# Patient Record
Sex: Female | Born: 1975 | Race: Black or African American | Hispanic: No | Marital: Married | Smoking: Never smoker
Health system: Southern US, Community
[De-identification: ages and names within clinical notes are randomized; demographics above are authoritative.]

## PROBLEM LIST (undated history)

## (undated) DIAGNOSIS — E282 Polycystic ovarian syndrome: Secondary | ICD-10-CM

## (undated) DIAGNOSIS — R112 Nausea with vomiting, unspecified: Secondary | ICD-10-CM

## (undated) DIAGNOSIS — Z9889 Other specified postprocedural states: Secondary | ICD-10-CM

## (undated) DIAGNOSIS — R011 Cardiac murmur, unspecified: Secondary | ICD-10-CM

## (undated) DIAGNOSIS — R030 Elevated blood-pressure reading, without diagnosis of hypertension: Secondary | ICD-10-CM

## (undated) DIAGNOSIS — R51 Headache: Secondary | ICD-10-CM

## (undated) DIAGNOSIS — D649 Anemia, unspecified: Secondary | ICD-10-CM

## (undated) HISTORY — PX: LYMPH GLAND EXCISION: SHX13

## (undated) HISTORY — PX: LAPAROSCOPIC OVARIAN CYSTECTOMY: SUR786

## (undated) HISTORY — PX: BUNIONECTOMY: SHX129

---

## 1998-12-24 ENCOUNTER — Other Ambulatory Visit: Admission: RE | Admit: 1998-12-24 | Discharge: 1998-12-24 | Payer: Self-pay | Admitting: Internal Medicine

## 1998-12-29 ENCOUNTER — Encounter: Admission: RE | Admit: 1998-12-29 | Discharge: 1998-12-29 | Payer: Self-pay | Admitting: Internal Medicine

## 1998-12-29 ENCOUNTER — Encounter: Payer: Self-pay | Admitting: Internal Medicine

## 1999-01-29 ENCOUNTER — Encounter: Payer: Self-pay | Admitting: Gastroenterology

## 1999-01-29 ENCOUNTER — Encounter: Admission: RE | Admit: 1999-01-29 | Discharge: 1999-01-29 | Payer: Self-pay | Admitting: Gastroenterology

## 1999-07-22 ENCOUNTER — Other Ambulatory Visit: Admission: RE | Admit: 1999-07-22 | Discharge: 1999-07-22 | Payer: Self-pay | Admitting: Surgery

## 2000-08-08 ENCOUNTER — Other Ambulatory Visit: Admission: RE | Admit: 2000-08-08 | Discharge: 2000-08-08 | Payer: Self-pay | Admitting: Internal Medicine

## 2001-12-07 ENCOUNTER — Other Ambulatory Visit: Admission: RE | Admit: 2001-12-07 | Discharge: 2001-12-07 | Payer: Self-pay | Admitting: Internal Medicine

## 2002-06-16 ENCOUNTER — Encounter: Payer: Self-pay | Admitting: Internal Medicine

## 2002-06-16 ENCOUNTER — Ambulatory Visit (HOSPITAL_COMMUNITY): Admission: RE | Admit: 2002-06-16 | Discharge: 2002-06-16 | Payer: Self-pay | Admitting: Internal Medicine

## 2003-02-28 ENCOUNTER — Other Ambulatory Visit: Admission: RE | Admit: 2003-02-28 | Discharge: 2003-02-28 | Payer: Self-pay | Admitting: Internal Medicine

## 2003-03-04 ENCOUNTER — Encounter: Admission: RE | Admit: 2003-03-04 | Discharge: 2003-03-04 | Payer: Self-pay | Admitting: Internal Medicine

## 2003-11-28 ENCOUNTER — Encounter: Admission: RE | Admit: 2003-11-28 | Discharge: 2003-11-28 | Payer: Self-pay | Admitting: Internal Medicine

## 2004-01-10 ENCOUNTER — Ambulatory Visit (HOSPITAL_COMMUNITY): Admission: RE | Admit: 2004-01-10 | Discharge: 2004-01-10 | Payer: Self-pay | Admitting: *Deleted

## 2004-01-10 ENCOUNTER — Encounter (INDEPENDENT_AMBULATORY_CARE_PROVIDER_SITE_OTHER): Payer: Self-pay | Admitting: *Deleted

## 2004-01-10 ENCOUNTER — Ambulatory Visit (HOSPITAL_BASED_OUTPATIENT_CLINIC_OR_DEPARTMENT_OTHER): Admission: RE | Admit: 2004-01-10 | Discharge: 2004-01-10 | Payer: Self-pay | Admitting: *Deleted

## 2004-03-03 ENCOUNTER — Other Ambulatory Visit: Admission: RE | Admit: 2004-03-03 | Discharge: 2004-03-03 | Payer: Self-pay | Admitting: Internal Medicine

## 2004-04-21 ENCOUNTER — Encounter: Admission: RE | Admit: 2004-04-21 | Discharge: 2004-04-21 | Payer: Self-pay | Admitting: Internal Medicine

## 2005-03-18 ENCOUNTER — Other Ambulatory Visit: Admission: RE | Admit: 2005-03-18 | Discharge: 2005-03-18 | Payer: Self-pay | Admitting: Internal Medicine

## 2006-03-28 ENCOUNTER — Other Ambulatory Visit: Admission: RE | Admit: 2006-03-28 | Discharge: 2006-03-28 | Payer: Self-pay | Admitting: Obstetrics and Gynecology

## 2006-09-23 ENCOUNTER — Inpatient Hospital Stay (HOSPITAL_COMMUNITY): Admission: AD | Admit: 2006-09-23 | Discharge: 2006-09-26 | Payer: Self-pay | Admitting: Obstetrics and Gynecology

## 2006-09-27 ENCOUNTER — Encounter: Admission: RE | Admit: 2006-09-27 | Discharge: 2006-10-27 | Payer: Self-pay | Admitting: Obstetrics and Gynecology

## 2006-10-28 ENCOUNTER — Encounter: Admission: RE | Admit: 2006-10-28 | Discharge: 2006-11-26 | Payer: Self-pay | Admitting: Obstetrics and Gynecology

## 2006-11-27 ENCOUNTER — Encounter: Admission: RE | Admit: 2006-11-27 | Discharge: 2006-12-27 | Payer: Self-pay | Admitting: Obstetrics and Gynecology

## 2006-12-28 ENCOUNTER — Encounter: Admission: RE | Admit: 2006-12-28 | Discharge: 2007-01-26 | Payer: Self-pay | Admitting: Obstetrics and Gynecology

## 2007-01-27 ENCOUNTER — Encounter: Admission: RE | Admit: 2007-01-27 | Discharge: 2007-02-26 | Payer: Self-pay | Admitting: Obstetrics and Gynecology

## 2007-02-26 ENCOUNTER — Emergency Department (HOSPITAL_COMMUNITY): Admission: EM | Admit: 2007-02-26 | Discharge: 2007-02-26 | Payer: Self-pay | Admitting: Emergency Medicine

## 2007-02-27 ENCOUNTER — Encounter: Admission: RE | Admit: 2007-02-27 | Discharge: 2007-03-29 | Payer: Self-pay | Admitting: Obstetrics and Gynecology

## 2007-03-30 ENCOUNTER — Encounter: Admission: RE | Admit: 2007-03-30 | Discharge: 2007-04-26 | Payer: Self-pay | Admitting: Obstetrics and Gynecology

## 2007-04-27 ENCOUNTER — Encounter: Admission: RE | Admit: 2007-04-27 | Discharge: 2007-05-27 | Payer: Self-pay | Admitting: Obstetrics and Gynecology

## 2007-05-28 ENCOUNTER — Encounter: Admission: RE | Admit: 2007-05-28 | Discharge: 2007-06-21 | Payer: Self-pay | Admitting: Obstetrics and Gynecology

## 2007-10-29 ENCOUNTER — Emergency Department (HOSPITAL_COMMUNITY): Admission: EM | Admit: 2007-10-29 | Discharge: 2007-10-29 | Payer: Self-pay | Admitting: Emergency Medicine

## 2008-06-28 ENCOUNTER — Encounter: Admission: RE | Admit: 2008-06-28 | Discharge: 2008-06-28 | Payer: Self-pay | Admitting: Internal Medicine

## 2009-06-05 ENCOUNTER — Other Ambulatory Visit: Admission: RE | Admit: 2009-06-05 | Discharge: 2009-06-05 | Payer: Self-pay | Admitting: Obstetrics and Gynecology

## 2010-02-08 NOTE — L&D Delivery Note (Signed)
Delivery Note At 5:53 PM a viable female was delivered via Vaginal at home.  Weight 6 lb 15.8 oz (3170 g).   Placenta status: , Pathology Spontaneous.  Cord: 3 vessels.  Anesthesia: Local. Episiotomy: None Lacerations: 2nd degree Suture Repair: 2.0 3.0 vicryl Est. Blood Loss (mL): 300 ml  Mom to postpartum.  Baby to NICU.  JACKSON-MOORE,Amariz Flamenco A 11/09/2010, 8:07 PM

## 2010-06-26 NOTE — Op Note (Signed)
NAME:  [name] [name], [name] [name] NO.:  [id number]   MEDICAL RECORD NO.:  [id number]          PATIENT TYPE:  AMB   LOCATION:  DSC                          FACILITY:  [facility]   PHYSICIAN:  Kathy Breach, M.D.      DATE OF BIRTH:  01/07/1976   DATE OF PROCEDURE:  01/10/2004  DATE OF DISCHARGE:                                 OPERATIVE REPORT   PREOPERATIVE DIAGNOSIS:  Persistent suprahyoid midline cervical node or  cyst.   OPERATIVE PROCEDURE:  Excision, suprahyoid midline node.   POSTOPERATIVE DIAGNOSIS:  Pending histologic evaluation.   DESCRIPTION OF PROCEDURE:  With the patient under general orotracheal  anesthesia, the anterior neck was prepped and draped in sterile fashion.  The patient had a palpable 1-1.5 cm cyst or node just to the right of the  middle immediately in the suprahyoid area of the neck.  Skin incision about  4 cm long was made in the depth of the skin crease of the neck and elevated.  The free margins of the mylohyoid muscle were identified.  Deep to the  mylohyoid muscles in the midline presented the mass and sharply dissecting,  there appeared to be a kidney-bean flattened lymph node immediately in the  suprahyoid region.  Hemostasis was complete with touch electrocautery and  clamp and ties with 4-0 silk sutures as indicated.  Upon removal, the wound  was irrigated.  Wound closed then with interrupted 4-0 chromic catgut  subcutaneously and the skin was approximated with a running 4-0 nylon  subcuticular suture.  Skin margins painted with Benzoin and a Steri-Strip  dressing applied over the incision line.  Blood loss for the procedure was  less than 10 mL.  The patient tolerated the procedure well and was taken to  the recovery room in stable general condition.       JGL/MEDQ  D:  01/10/2004  T:  01/11/2004  Job:  [id number]

## 2010-09-17 LAB — ABO/RH: RH Type: POSITIVE

## 2010-09-17 LAB — RUBELLA ANTIBODY, IGM: Rubella: IMMUNE

## 2010-09-17 LAB — HEPATITIS B SURFACE ANTIGEN: Hepatitis B Surface Ag: NEGATIVE

## 2010-10-06 ENCOUNTER — Ambulatory Visit: Payer: Self-pay | Admitting: Obstetrics

## 2010-10-29 ENCOUNTER — Encounter: Payer: Self-pay | Admitting: Obstetrics and Gynecology

## 2010-11-06 LAB — STREP B DNA PROBE: GBS: NEGATIVE

## 2010-11-09 ENCOUNTER — Encounter (HOSPITAL_COMMUNITY): Payer: Self-pay

## 2010-11-09 ENCOUNTER — Encounter (HOSPITAL_COMMUNITY): Payer: Self-pay | Admitting: Obstetrics

## 2010-11-09 ENCOUNTER — Inpatient Hospital Stay (HOSPITAL_COMMUNITY)
Admission: AD | Admit: 2010-11-09 | Discharge: 2010-11-09 | Disposition: A | Discharge status: Home / Self Care | Source: Ambulatory Visit | Attending: Obstetrics & Gynecology | Admitting: Obstetrics & Gynecology

## 2010-11-09 ENCOUNTER — Inpatient Hospital Stay (HOSPITAL_COMMUNITY)
Admission: AD | Admit: 2010-11-09 | Discharge: 2010-11-11 | DRG: 775 | Disposition: A | Discharge status: Home / Self Care | Source: Ambulatory Visit | Attending: Obstetrics & Gynecology | Admitting: Obstetrics & Gynecology

## 2010-11-09 ENCOUNTER — Other Ambulatory Visit: Payer: Self-pay | Admitting: Obstetrics & Gynecology

## 2010-11-09 DIAGNOSIS — O479 False labor, unspecified: Secondary | ICD-10-CM | POA: Insufficient documentation

## 2010-11-09 DIAGNOSIS — O99019 Anemia complicating pregnancy, unspecified trimester: Secondary | ICD-10-CM | POA: Diagnosis present

## 2010-11-09 DIAGNOSIS — O09529 Supervision of elderly multigravida, unspecified trimester: Secondary | ICD-10-CM | POA: Diagnosis present

## 2010-11-09 HISTORY — DX: Anemia, unspecified: D64.9

## 2010-11-09 HISTORY — DX: Headache: R51

## 2010-11-09 HISTORY — DX: Polycystic ovarian syndrome: E28.2

## 2010-11-09 LAB — DIFFERENTIAL
Basophils Relative: 1
Eosinophils Relative: 2
Lymphs Abs: 2.7
Monocytes Absolute: 0.7
Neutro Abs: 5.1

## 2010-11-09 LAB — CBC
HCT: 30.9 — ABNORMAL LOW
MCV: 64.4 — ABNORMAL LOW
Platelets: 453 — ABNORMAL HIGH
RBC: 4.8
WBC: 8.8

## 2010-11-09 MED ORDER — BUTORPHANOL TARTRATE 2 MG/ML IJ SOLN
1.0000 mg | Freq: Once | INTRAMUSCULAR | Status: AC
  Administered 2010-11-09: 1 mg via INTRAVENOUS

## 2010-11-09 MED ORDER — MEASLES, MUMPS & RUBELLA VAC ~~LOC~~ INJ: 0.5000 mL | INJECTION | Freq: Once | SUBCUTANEOUS | Status: DC

## 2010-11-09 MED ORDER — MEDROXYPROGESTERONE ACETATE 150 MG/ML IM SUSP: 150.0000 mg | INTRAMUSCULAR | Status: DC | PRN

## 2010-11-09 MED ORDER — DIBUCAINE 1 % RE OINT
1.0000 "application " | TOPICAL_OINTMENT | RECTAL | Status: DC | PRN
  Administered 2010-11-11: 1 via RECTAL
  Filled 2010-11-09: qty 28

## 2010-11-09 MED ORDER — LIDOCAINE HCL (PF) 1 % IJ SOLN
INTRAMUSCULAR | Status: AC
  Filled 2010-11-09: qty 30

## 2010-11-09 MED ORDER — PRENATAL PLUS 27-1 MG PO TABS
1.0000 | ORAL_TABLET | Freq: Every day | ORAL | Status: DC
  Administered 2010-11-10 – 2010-11-11 (×2): 1 via ORAL
  Filled 2010-11-09 (×2): qty 1

## 2010-11-09 MED ORDER — LANOLIN HYDROUS EX OINT: TOPICAL_OINTMENT | CUTANEOUS | Status: DC | PRN

## 2010-11-09 MED ORDER — MAGNESIUM HYDROXIDE 400 MG/5ML PO SUSP
30.0000 mL | ORAL | Status: DC | PRN
  Administered 2010-11-11: 30 mL via ORAL
  Filled 2010-11-09: qty 30

## 2010-11-09 MED ORDER — IBUPROFEN 600 MG PO TABS
600.0000 mg | ORAL_TABLET | Freq: Four times a day (QID) | ORAL | Status: DC
  Administered 2010-11-10 – 2010-11-11 (×7): 600 mg via ORAL
  Filled 2010-11-09 (×7): qty 1

## 2010-11-09 MED ORDER — SENNOSIDES-DOCUSATE SODIUM 8.6-50 MG PO TABS
2.0000 | ORAL_TABLET | Freq: Every day | ORAL | Status: DC
  Administered 2010-11-09 – 2010-11-10 (×2): 2 via ORAL

## 2010-11-09 MED ORDER — BENZOCAINE-MENTHOL 20-0.5 % EX AERO
INHALATION_SPRAY | CUTANEOUS | Status: AC
  Administered 2010-11-09: 1 via TOPICAL
  Filled 2010-11-09: qty 56

## 2010-11-09 MED ORDER — OXYTOCIN 20 UNITS IN LACTATED RINGERS INFUSION - SIMPLE
INTRAVENOUS | Status: AC
  Administered 2010-11-09: 20 [IU]
  Filled 2010-11-09: qty 1000

## 2010-11-09 MED ORDER — FERROUS SULFATE 325 (65 FE) MG PO TABS
325.0000 mg | ORAL_TABLET | Freq: Two times a day (BID) | ORAL | Status: DC
  Administered 2010-11-11: 325 mg via ORAL
  Filled 2010-11-09 (×3): qty 1

## 2010-11-09 MED ORDER — OXYCODONE-ACETAMINOPHEN 5-325 MG PO TABS
1.0000 | ORAL_TABLET | ORAL | Status: DC | PRN
  Administered 2010-11-10 (×2): 1 via ORAL
  Filled 2010-11-09 (×2): qty 1

## 2010-11-09 MED ORDER — DIPHENHYDRAMINE HCL 25 MG PO CAPS: 25.0000 mg | ORAL_CAPSULE | Freq: Four times a day (QID) | ORAL | Status: DC | PRN

## 2010-11-09 MED ORDER — WITCH HAZEL-GLYCERIN EX PADS
1.0000 "application " | MEDICATED_PAD | CUTANEOUS | Status: DC | PRN
  Administered 2010-11-11: 1 via TOPICAL

## 2010-11-09 MED ORDER — ZOLPIDEM TARTRATE 5 MG PO TABS: 5.0000 mg | ORAL_TABLET | Freq: Every evening | ORAL | Status: DC | PRN

## 2010-11-09 MED ORDER — ONDANSETRON HCL 4 MG PO TABS: 4.0000 mg | ORAL_TABLET | ORAL | Status: DC | PRN

## 2010-11-09 MED ORDER — ONDANSETRON HCL 4 MG/2ML IJ SOLN: 4.0000 mg | INTRAMUSCULAR | Status: DC | PRN

## 2010-11-09 MED ORDER — TETANUS-DIPHTH-ACELL PERTUSSIS 5-2.5-18.5 LF-MCG/0.5 IM SUSP
0.5000 mL | Freq: Once | INTRAMUSCULAR | Status: DC
  Filled 2010-11-09: qty 0.5

## 2010-11-09 MED ORDER — BENZOCAINE-MENTHOL 20-0.5 % EX AERO
1.0000 "application " | INHALATION_SPRAY | CUTANEOUS | Status: DC | PRN
  Administered 2010-11-09: 1 via TOPICAL

## 2010-11-09 NOTE — H&P (Signed)
[name] [name] is a 35 y.o. female presenting for continued care after precipitous delivery at home. Maternal Medical History:  Reason for admission: Reason for Admission:   nauseaBIBEMS s/p precipitous delivery of a viable female at home.  The placenta remains insitu.  Seen earlier in the office and in MAU-- 2 cm dilated.  Contractions: Onset was 6-12 hours ago.   Frequency: regular.    Prenatal complications: no prenatal complications   OB History    Grav Para Term Preterm Abortions TAB SAB Ect Mult Living   3 2 1 1 1  1   2      Past Medical History  Diagnosis Date  . Headache   . Anemia   . PCOS (polycystic ovarian syndrome)    Past Surgical History  Procedure Date  . Bunionectomy   . Laparoscopic ovarian cystectomy   . Lymph gland excision    Family History: family history is not on file. Social History:  reports that she has never smoked. She has never used smokeless tobacco. She reports that she does not drink alcohol or use illicit drugs.  Review of Systems  Constitutional: Negative for fever.  Eyes: Negative for blurred vision.  Respiratory: Negative for shortness of breath.   Gastrointestinal: Negative for nausea and vomiting.  Skin: Negative for rash.  Neurological: Negative for headaches.      Blood pressure 135/71, pulse 83, temperature 98.7 F (37.1 C), temperature source Oral, resp. rate 18, unknown if currently breastfeeding. Maternal Exam:  Introitus: See delivery note     Physical Exam  Constitutional: She appears well-developed.  HENT:  Head: Normocephalic.  Neck: Neck supple. No thyromegaly present.  Cardiovascular: Normal rate and regular rhythm.   Respiratory: Breath sounds normal.  GI: Soft. Bowel sounds are normal.  Skin: No rash noted.    Prenatal labs: ABO, Rh: AB/Positive/-- (08/09 0000) Antibody: Negative (08/09 0000) Rubella: Immune (08/09 0000) RPR: Nonreactive (08/09 0000)  HBsAg: Negative (08/09 0000)  HIV:  Non-reactive (08/09 0000)  GBS: Negative (09/28 0000)   Assessment/Plan: 35 y.o. s/p an NSVD at home.  Placenta insitu.  Admit Management of third stage   JACKSON-MOORE,Trenise Turay A 11/09/2010, 8:01 PM

## 2010-11-09 NOTE — Progress Notes (Signed)
Pt states she was in the office this am and was 1-2/100. States contractions now every 3 minutes. Denies bleeding or leaking and reports good fetal movement.

## 2010-11-10 LAB — CBC
MCH: 21.6 pg — ABNORMAL LOW (ref 26.0–34.0)
MCHC: 31.7 g/dL (ref 30.0–36.0)
Platelets: 270 10*3/uL (ref 150–400)
RBC: 4.22 MIL/uL (ref 3.87–5.11)

## 2010-11-10 NOTE — Progress Notes (Signed)
UR Chart review completed.  

## 2010-11-10 NOTE — Progress Notes (Signed)
  Post Partum Day 1 S/P spontaneous vaginal RH status/Rubella reviewed.  Feeding: breast Subjective: No HA, SOB, CP, F/C, breast symptoms. Normal vaginal bleeding, no clots.     Objective: BP 122/67  Pulse 76  Temp(Src) 98.2 F (36.8 C) (Oral)  Resp 18  SpO2 100%  Breastfeeding? Unknown   Physical Exam:  General: alert Lochia: appropriate Uterine Fundus: firm DVT Evaluation: No evidence of DVT seen on physical exam. Ext: No c/c/e  Basename 11/10/10 0505  HGB 9.1*  HCT 28.7*      Assessment/Plan: 35 y.o.  PPD #1 .  normal postpartum exam Continue current postpartum care  Ambulate   LOS: 1 day   JACKSON-MOORE,Jerrie Gullo A 11/10/2010, 10:27 AM

## 2010-11-11 DIAGNOSIS — O99019 Anemia complicating pregnancy, unspecified trimester: Secondary | ICD-10-CM | POA: Diagnosis present

## 2010-11-11 MED ORDER — POLYETHYLENE GLYCOL 3350 17 G PO PACK: 17.0000 g | PACK | Freq: Every day | ORAL | Status: DC

## 2010-11-11 MED ORDER — FERROUS SULFATE 325 (65 FE) MG PO TABS: 325.0000 mg | ORAL_TABLET | Freq: Two times a day (BID) | ORAL | Status: DC

## 2010-11-11 MED ORDER — IBUPROFEN 600 MG PO TABS: 600.0000 mg | ORAL_TABLET | Freq: Four times a day (QID) | ORAL | Status: AC

## 2010-11-11 MED ORDER — OXYCODONE-ACETAMINOPHEN 5-325 MG PO TABS: 1.0000 | ORAL_TABLET | ORAL | Status: AC | PRN

## 2010-11-11 NOTE — Progress Notes (Signed)
PSYCHOSOCIAL ASSESSMENT ~ MATERNAL/CHILD Name: [name] [name]                                                                                    Age: 35 years   Referral Date: 11/11/10   Reason/Source: NICU Support/NICU  I. FAMILY/HOME ENVIRONMENT A. Child's Legal Guardian _x__Parent(s) ___Grandparent ___Foster parent ___DSS_________________ Name: [name] [name]                                                              DOB: //                     Age:   Address: [address] [place], [place] [address]  Name: [name] [name]                                                              DOB: //                     Age:   Address: [place]  B. Other Household Members/Support Persons Name:                                    Relationship: MGM               DOB ___/___/___                   Name: [name] [name]               Relationship: sister              DOB 09/24/06                   Name:                                         Relationship:                        DOB ___/___/___                   Name:                                         Relationship:                        DOB ___/___/___  C. Other Support: friends   II. PSYCHOSOCIAL DATA A. Information Source  _x_Patient Interview  __Family Interview           _x_Other: chart  B. Event organiser _x_Employment: FOB is in the [employer].  MOB is not currently working. __Medicaid    [place]:                 _x_Private Insurance: Tricare                  __Self Pay  _x_Food Stamps   _x_WIC __[employer] First     __Public Housing     __Section 8    __Maternity Care Coordination/Child Service Coordination/Early Intervention  __School:                                                                         Grade:  __Other:   [name] [name] Cultural and Environment Information Cultural Issues Impacting Care: none  known  III. STRENGTHS _x__Supportive family/friends ___Adequate Resources _x__Compliance with medical plan _x__Home prepared for Child (including basic supplies) _x__Understanding of illness      _x__Other: [facility] IV. RISK FACTORS AND CURRENT PROBLEMS         ____No Problems Noted                                                                                                                                                                                                                                                Pt              Family          Substance Abuse                                                                   ___              ___  Mental Illness                                                                        ___              ___  Family/Relationship Issues                                      ___               _x__             Abuse/Neglect/Domestic Violence                                         ___         ___  Financial Resources                                        ___              ___             Transportation                                                                        ___               ___  DSS Involvement                                                                   ___              ___  Adjustment to Illness                                                               ___              ___  Knowledge/Cognitive Deficit                                                   ___              ___  Compliance with Treatment                                                 ___              ___  Basic Needs (food, housing, etc.)                                          ___              ___             Housing Concerns                                       ___              ___ Other_____________________________________________________________            V. SOCIAL WORK  ASSESSMENT SW met with MOB in her third floor room to introduce myself, complete assessment and evaluate how family is coping with baby's admission to NICU.  MOB was very pleasant and open with SW.  She states that her 35 year old daughter had the same issue with low blood sugar and had to stay in the NICU for about a week, so she said she is "used to it."  She states that she has been receiving updates from staff and seems to have a very good understanding of the situation.  She states she just moved back to [place] from [place] in August and is currently not working and living with her mother.  She states that she came to the hospital and was sent home and within two hours, she delivered the baby at home.  She reports that her mother is very supportive and that she is from here and has many friends.  She states she moved back here to be with her support system.  MOB began to cry when SW asked about FOB.  She states that he is still in [place] and they are separated.  She states they have been married for 4 years and he came back a different person after being deployed.  She states he is close with their 35 year old, but does not want to be involved with this baby and is questioning paternity.  MOB states she and her daughter are on his insurance and she is not sure what to do about the baby.  SW asked her if she would like to speak to [place].  She asked for her number and states she will call her if needed.  She reports no issues with transportation and that she has all supplies for baby at home.  SW explained support services offered by NICU SWs and gave contact information.  MOB seemed very appreciative and states no questions or needs at this time.  VI. SOCIAL WORK PLAN  ___No Further Intervention Required/No Barriers to Discharge   _x__Psychosocial Support and Ongoing Assessment of Needs   ___Patient/Family Education:   ___Child Protective Services Report   County___________  Date___/____/____   ___Information/Referral to [employer]_________________________   ___Other:

## 2010-11-11 NOTE — Progress Notes (Signed)
Post Partum Day #2 S/P:spontaneous vaginal  RH status/Rubella reviewed.  Feeding: breast Subjective: No HA, SOB, CP, F/C, breast symptoms: No. Normal vaginal bleeding, no clots.     Objective:  Blood pressure 131/74, pulse 71, temperature 97.5 F (36.4 C), temperature source Oral, resp. rate 18, height 5\' 4"  (1.626 m), weight 97.523 kg (215 lb), SpO2 99.00%, unknown if currently breastfeeding.   Physical Exam:  General: alert Lochia: appropriate Uterine Fundus: firm DVT Evaluation: No evidence of DVT seen on physical exam. Ext: No c/c/e  Basename 11/10/10 0505  HGB 9.1*  HCT 28.7*    Assessment/Plan: 35 y.o.  PPD # 2 .  normal postpartum exam Continue current postpartum care D/C home   LOS: 2 days   JACKSON-MOORE,Anaisha Mago A 11/11/2010, 9:38 AM

## 2010-11-11 NOTE — Discharge Summary (Signed)
  Obstetric Discharge Summary Reason for Admission: onset of labor Prenatal Procedures: none Intrapartum Procedures: spontaneous vaginal delivery Postpartum Procedures: none Complications-Operative and Postpartum: none  Hemoglobin  Date Value Range Status  11/10/2010 9.1* 12.0-15.0 (g/dL) Final     HCT  Date Value Range Status  11/10/2010 28.7* 36.0-46.0 (%) Final    Discharge Diagnoses: Term Pregnancy-delivered  Discharge Information: Date: 11/11/2010 Activity: pelvic rest Diet: routine Medications: Ibuprofen, Percocet, PNV, FeSO4 Condition: stable Instructions: refer to routine discharge instructions Discharge to: home   Newborn Data: Live born  Information for the patient's newborn:  Tala, Eber [[id number]]  female ; APGAR , ; weight ;  Home with mother.  JACKSON-MOORE,Dailee Manalang A 11/11/2010, 9:43 AM

## 2010-11-20 LAB — CBC
HCT: 36.3
MCHC: 32.5
MCV: 73 — ABNORMAL LOW
Platelets: 258
Platelets: 287
RDW: 16.7 — ABNORMAL HIGH
RDW: 16.8 — ABNORMAL HIGH

## 2010-11-20 LAB — RPR: RPR Ser Ql: NONREACTIVE

## 2010-11-20 LAB — STREP B DNA PROBE

## 2011-05-22 HISTORY — PX: BUNIONECTOMY: SHX129

## 2011-06-17 ENCOUNTER — Encounter (HOSPITAL_COMMUNITY): Payer: Self-pay | Admitting: Emergency Medicine

## 2011-06-17 ENCOUNTER — Emergency Department (INDEPENDENT_AMBULATORY_CARE_PROVIDER_SITE_OTHER)
Admission: EM | Admit: 2011-06-17 | Discharge: 2011-06-17 | Disposition: A | Discharge status: Home / Self Care | Source: Home / Self Care | Attending: Family Medicine | Admitting: Family Medicine

## 2011-06-17 DIAGNOSIS — J069 Acute upper respiratory infection, unspecified: Secondary | ICD-10-CM

## 2011-06-17 MED ORDER — GUAIFENESIN-CODEINE 100-10 MG/5ML PO SYRP: ORAL_SOLUTION | ORAL | Status: DC

## 2011-06-17 MED ORDER — AMOXICILLIN 500 MG PO CAPS: 500.0000 mg | ORAL_CAPSULE | Freq: Three times a day (TID) | ORAL | Status: AC

## 2011-06-17 NOTE — ED Notes (Signed)
HERE WITH SINUS SX THAT STARTED TUES SINUS PRESSURE AND PAIN BEHIND EYES,COUGH AND NOW CHEST CONGESTION AND TIGHTNESS.NO FEVERS,CHILLS REPORTED.TAKING COUGH SYRUP

## 2011-06-17 NOTE — ED Provider Notes (Signed)
History     CSN: [id number]  Arrival date & time 06/17/11  1478   First MD Initiated Contact with Patient 06/17/11 0818      Chief Complaint  Patient presents with  . Sinusitis  . URI    (Consider location/radiation/quality/duration/timing/severity/associated sxs/prior treatment) HPI Comments: The patient reports a 2 day hx of sinus congestion , runny nose, post nasal drainage. No sore throat. Coughing started yesterday and is non productive. No fever. States the whole family has been sick with similar symptoms. Taking otc cough prep with minimal relief. Coughing causes a ha and some chest discomfort.   Patient is a 36 y.o. female presenting with URI. The history is provided by the patient.  URI The primary symptoms include headaches, cough and wheezing. Primary symptoms do not include sore throat.  Symptoms associated with the illness include congestion and rhinorrhea.    Past Medical History  Diagnosis Date  . Headache   . Anemia   . PCOS (polycystic ovarian syndrome)     Past Surgical History  Procedure Date  . Bunionectomy   . Laparoscopic ovarian cystectomy   . Lymph gland excision     No family history on file.  History  Substance Use Topics  . Smoking status: Never Smoker   . Smokeless tobacco: Never Used  . Alcohol Use: Yes     OCASSIONALLY    OB History    Grav Para Term Preterm Abortions TAB SAB Ect Mult Living   3 2 1 1 1  1   2       Review of Systems  Constitutional: Negative.   HENT: Positive for congestion, rhinorrhea and postnasal drip. Negative for sore throat.   Respiratory: Positive for cough and wheezing.   Cardiovascular: Negative.   Gastrointestinal: Negative.   Genitourinary: Negative.   Musculoskeletal: Negative.   Skin: Negative.   Neurological: Positive for headaches.    Allergies  Review of patient's allergies indicates no known allergies.  Home Medications   Current Outpatient Rx  Name Route Sig Dispense Refill  .  AMOXICILLIN 500 MG PO CAPS Oral Take 1 capsule (500 mg total) by mouth 3 (three) times daily. 30 capsule 0  . BUTALBITAL-APAP-CAFFEINE 50-325-40 MG PO TABS Oral Take 2 tablets by mouth. For migrains     . FERROUS SULFATE 325 (65 FE) MG PO TABS Oral Take 1 tablet (325 mg total) by mouth 2 (two) times daily before a meal. 60 tablet 11  . GUAIFENESIN-CODEINE 100-10 MG/5ML PO SYRP  1-2 tsp po q 6 hrs prn cough 120 mL 0  . PRENATAL PLUS 27-1 MG PO TABS Oral Take 1 tablet by mouth daily.        BP 139/80  Pulse 92  Temp(Src) 98.7 F (37.1 C) (Oral)  Resp 16  SpO2 97%  LMP 06/09/2011  Physical Exam  Nursing note and vitals reviewed. Constitutional: She appears well-developed and well-nourished. No distress.  HENT:  Head: Normocephalic and atraumatic.       Ears clear, nose congested, throat mild erythema with post nasal drainage.   Neck: Normal range of motion. Neck supple. No thyromegaly present.  Cardiovascular: Normal rate and regular rhythm.   Pulmonary/Chest: Effort normal and breath sounds normal. She has no wheezes.       Croupy dry cough noted  Lymphadenopathy:    She has no cervical adenopathy.  Skin: Skin is warm and dry.    ED Course  Procedures (including critical care time)  Labs Reviewed - No  data to display No results found.   1. URI (upper respiratory infection)       MDM          Randa Spike, MD 06/17/11 [phone]

## 2011-06-17 NOTE — Discharge Instructions (Signed)
Tylenol as needed. Avoid  Caffeine and milk products. Follow up with your pcp or return if symptoms persist or worsen.

## 2011-10-20 ENCOUNTER — Other Ambulatory Visit: Payer: Self-pay | Admitting: Podiatry

## 2011-10-25 DIAGNOSIS — T8489XA Other specified complication of internal orthopedic prosthetic devices, implants and grafts, initial encounter: Secondary | ICD-10-CM | POA: Insufficient documentation

## 2011-10-26 DIAGNOSIS — M869 Osteomyelitis, unspecified: Secondary | ICD-10-CM | POA: Insufficient documentation

## 2011-11-23 DIAGNOSIS — T8489XA Other specified complication of internal orthopedic prosthetic devices, implants and grafts, initial encounter: Secondary | ICD-10-CM

## 2011-11-23 DIAGNOSIS — M869 Osteomyelitis, unspecified: Secondary | ICD-10-CM

## 2011-11-29 ENCOUNTER — Encounter: Payer: Self-pay | Admitting: Internal Medicine

## 2011-11-29 ENCOUNTER — Ambulatory Visit (INDEPENDENT_AMBULATORY_CARE_PROVIDER_SITE_OTHER): Payer: BC Managed Care – PPO | Admitting: Internal Medicine

## 2011-11-29 VITALS — BP 124/70 | HR 96 | Temp 98.4°F | Wt 245.5 lb

## 2011-11-29 DIAGNOSIS — M869 Osteomyelitis, unspecified: Secondary | ICD-10-CM

## 2011-11-29 MED ORDER — SULFAMETHOXAZOLE-TMP DS 800-160 MG PO TABS: 1.0000 | ORAL_TABLET | Freq: Two times a day (BID) | ORAL | Status: DC

## 2011-11-29 NOTE — Progress Notes (Signed)
Patient ID: [name] [name], female   DOB: Mar 21, 1975, 36 y.o.   MRN: [id number]    [facility] for Infectious Disease  Reason for Consult:Serratia osteomyelitis of right great toe Referring Physician: Dr. Merwyn Katos  Patient Active Problem List  Diagnosis  . Normal delivery  . Anemia complicating pregnancy  . Unspecified osteomyelitis, ankle and foot  . Other complications due to other internal orthopedic device, implant, and graft    Patient's Medications  New Prescriptions   SULFAMETHOXAZOLE-TRIMETHOPRIM (BACTRIM DS) 800-160 MG PER TABLET    Take 1 tablet by mouth 2 (two) times daily.  Previous Medications   FERROUS SULFATE 325 (65 FE) MG TABLET    Take 1 tablet (325 mg total) by mouth 2 (two) times daily before a meal.   MEPERIDINE (DEMEROL) 50 MG TABLET    Take 50 mg by mouth every 6 (six) hours as needed.   PROMETHAZINE (PHENERGAN) 25 MG TABLET    Take 25 mg by mouth every 6 (six) hours as needed.  Modified Medications   No medications on file  Discontinued Medications   AMOXICILLIN-CLAVULANATE (AUGMENTIN) 875-125 MG PER TABLET    Take 1 tablet by mouth 2 (two) times daily.   BUTALBITAL-ACETAMINOPHEN-CAFFEINE (FIORICET, ESGIC) 50-325-40 MG PER TABLET    Take 2 tablets by mouth. For migrains    CIPROFLOXACIN (CIPRO) 750 MG TABLET    Take 750 mg by mouth 2 (two) times daily.   GUAIFENESIN-CODEINE (ROBITUSSIN AC) 100-10 MG/5ML SYRUP    1-2 tsp po q 6 hrs prn cough   PRENATAL VITAMIN W/FE, FA (PRENATAL 1 + 1) 27-1 MG TABS    Take 1 tablet by mouth daily.      Recommendations: 1. Change ciprofloxacin to trimethoprim sulfamethoxazole 2. Check sedimentation rate and C-reactive protein 3. Followup in 2 weeks   Assessment: It sounds like she is improving after 2 recent surgeries and a months of ciprofloxacin therapy. I suspect the recent slight increase in swelling is related to being up on her feet and back in school rather than worsening infection. I will check inflammatory  markers today and continue antibiotic therapy with trimethoprim sulfamethoxazole. She may tolerate this better with less nausea and it is less likely to cause C. Difficile colitis then ciprofloxacin.   HPI: [name] [name] is a 37 y.o. female who underwent a right great toe bunionectomy in April. The pin started to work its way loose and was removed in July. The pin tract drained for about a month and slowly healed over. In early September she had sudden pain, swelling, and redness of her right foot associated with fever and chills. She underwent I&D of a right great toe abscess on September 11. Operative cultures grew Serratia sensitive to all antibiotics tested other than cefazolin. She was started on ciprofloxacin which she continues to take. She underwent an MRI of her foot on September 18 which showed small subcutaneous abscess sees and loss of visualization of the metatarsal cortex. There is also a metatarsal phalangeal joint effusion. She tells me that she underwent another surgery on September 24 but I do not have records of that procedure.  Overall she is feeling better. She has not had any more fever or chills. The pain and redness in her foot are much improved. The swelling is much better but seems to be a little worse in the last week. 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 She is  a little bit of nausea which she attributes to the ciprofloxacin.  Review of Systems: Pertinent items are noted in HPI.      Past Medical History  Diagnosis Date  . Headache   . Anemia   . PCOS (polycystic ovarian syndrome)     History  Substance Use Topics  . Smoking status: Never Smoker   . Smokeless tobacco: Never Used  . Alcohol Use: Yes     OCASSIONALLY    No family history on file. No Known Allergies  OBJECTIVE: Blood pressure 124/70, pulse 96, temperature 98.4 F (36.9 C), temperature source Oral, weight 245 lb 8 oz (111.358  kg). General: she is overweight. She is in good spirits. One of her daughters is with her today Skin: no rash Lungs: clear Cor: regular S1 and S2 no murmurs Abdomen: obese, soft nontender Right foot: She has a surgical incision over the right first metatarsal and toe. There is no drainage but there is one small area of pink granulation tissue. She has good range of motion with minimal pain. There is mild swelling medial to the incision without any redness or warmth. Microbiology: No results found for this or any previous visit (from the past 240 hour(s)).  Cliffton Asters, MD [facility] for Infectious Disease [facility] [phone] pager   [phone] cell 11/29/2011, 2:35 PM

## 2011-11-30 LAB — SEDIMENTATION RATE: Sed Rate: 51 mm/hr — ABNORMAL HIGH (ref 0–22)

## 2011-12-16 ENCOUNTER — Encounter: Payer: Self-pay | Admitting: Internal Medicine

## 2011-12-16 ENCOUNTER — Ambulatory Visit (INDEPENDENT_AMBULATORY_CARE_PROVIDER_SITE_OTHER): Payer: BC Managed Care – PPO | Admitting: Internal Medicine

## 2011-12-16 VITALS — BP 121/74 | HR 91 | Temp 98.3°F | Ht 64.5 in | Wt 248.0 lb

## 2011-12-16 DIAGNOSIS — Z23 Encounter for immunization: Secondary | ICD-10-CM

## 2011-12-16 DIAGNOSIS — M869 Osteomyelitis, unspecified: Secondary | ICD-10-CM

## 2011-12-16 LAB — SEDIMENTATION RATE: Sed Rate: 90 mm/hr — ABNORMAL HIGH (ref 0–22)

## 2011-12-16 LAB — C-REACTIVE PROTEIN: CRP: 2.2 mg/dL — ABNORMAL HIGH (ref <0.60)

## 2011-12-16 NOTE — Progress Notes (Signed)
Patient ID: [name] [name], female   DOB: [birth year], 36 y.o.   MRN: [id number]    [facility] for Infectious Disease  Patient Active Problem List  Diagnosis  . Normal delivery  . Anemia complicating pregnancy  . Unspecified osteomyelitis, ankle and foot  . Other complications due to other internal orthopedic device, implant, and graft    Patient's Medications  New Prescriptions   No medications on file  Previous Medications   FERROUS SULFATE 325 (65 FE) MG TABLET    Take 1 tablet (325 mg total) by mouth 2 (two) times daily before a meal.   MEPERIDINE (DEMEROL) 50 MG TABLET    Take 50 mg by mouth every 6 (six) hours as needed.   PROMETHAZINE (PHENERGAN) 25 MG TABLET    Take 25 mg by mouth every 6 (six) hours as needed.   SULFAMETHOXAZOLE-TRIMETHOPRIM (BACTRIM DS) 800-160 MG PER TABLET    Take 1 tablet by mouth 2 (two) times daily.  Modified Medications   No medications on file  Discontinued Medications   No medications on file    Subjective: [name] [name] is in for her routine followup visit. 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 She has not had any trouble tolerating her antibiotic. She is feeling better. She still has occasional shooting pains in her right foot after she has been standing for several hours but this has improved. She's had decreased swelling in her right foot and has not had any drainage from her incision for a little over one week.  Objective: Temp: 98.3 F (36.8 C) (11/07 0855) Temp src: Oral (11/07 0855) BP: 121/74 mmHg (11/07 0855) Pulse Rate: 91  (11/07 0855)  General: She is in good spirits Right foot: Her surgical incision over the right first metatarsal is healed without any drainage. She has only minimal residual swelling of her foot, with no pain, redness or warmth. Lab Results  Component Value Date   CRP 2.0* 11/29/2011    Lab Results  Component Value Date   ESRSEDRATE 51* 11/29/2011       Assessment: She is improving on therapy for Serratia osteomyelitis. I will repeat her sedimentation rate and C-reactive protein today and consider stopping her antibiotics.  Plan: 1. Continue trimethoprim sulfamethoxazole for now 2. Repeat sedimentation rate and C-reactive protein 3. I will call her with results 4. Followup in one month   Cliffton Asters, MD [facility] for Infectious Disease [facility] [phone] pager   [phone] cell 12/16/2011, 9:13 AM

## 2011-12-20 ENCOUNTER — Telehealth: Payer: Self-pay | Admitting: Internal Medicine

## 2011-12-20 NOTE — Telephone Encounter (Signed)
Lab Results  Component Value Date   CRP 2.2* 12/16/2011    Lab Results  Component Value Date   ESRSEDRATE 90* 12/16/2011    Her inflammatory markers remain elevated. I called her home/cell number and left a message asking her to remain on her trimethoprim sulfamethoxazole until her followup appointment with me on December 5.

## 2012-01-18 ENCOUNTER — Ambulatory Visit: Payer: BC Managed Care – PPO | Admitting: Internal Medicine

## 2012-02-22 ENCOUNTER — Telehealth: Payer: Self-pay | Admitting: *Deleted

## 2012-02-22 ENCOUNTER — Ambulatory Visit: Payer: BC Managed Care – PPO | Admitting: Internal Medicine

## 2012-02-22 NOTE — Telephone Encounter (Signed)
Made new appt for pt.

## 2012-02-28 ENCOUNTER — Encounter: Payer: Self-pay | Admitting: Internal Medicine

## 2012-02-28 ENCOUNTER — Ambulatory Visit (INDEPENDENT_AMBULATORY_CARE_PROVIDER_SITE_OTHER): Payer: BC Managed Care – PPO | Admitting: Internal Medicine

## 2012-02-28 VITALS — BP 130/79 | HR 102 | Temp 98.3°F | Ht 64.0 in | Wt 247.0 lb

## 2012-02-28 DIAGNOSIS — R5383 Other fatigue: Secondary | ICD-10-CM

## 2012-02-28 DIAGNOSIS — M869 Osteomyelitis, unspecified: Secondary | ICD-10-CM

## 2012-02-28 LAB — CBC
Hemoglobin: 7.8 g/dL — ABNORMAL LOW (ref 12.0–15.0)
MCH: 16.3 pg — ABNORMAL LOW (ref 26.0–34.0)
MCHC: 27.9 g/dL — ABNORMAL LOW (ref 30.0–36.0)
Platelets: 489 10*3/uL — ABNORMAL HIGH (ref 150–400)
RDW: 20.9 % — ABNORMAL HIGH (ref 11.5–15.5)

## 2012-02-28 LAB — COMPREHENSIVE METABOLIC PANEL
ALT: 11 U/L (ref 0–35)
AST: 13 U/L (ref 0–37)
Albumin: 4.2 g/dL (ref 3.5–5.2)
Alkaline Phosphatase: 57 U/L (ref 39–117)
Glucose, Bld: 81 mg/dL (ref 70–99)
Potassium: 4.2 mEq/L (ref 3.5–5.3)
Sodium: 137 mEq/L (ref 135–145)
Total Bilirubin: 0.2 mg/dL — ABNORMAL LOW (ref 0.3–1.2)
Total Protein: 7.4 g/dL (ref 6.0–8.3)

## 2012-02-28 NOTE — Progress Notes (Signed)
Patient ID: [name] [name], female   DOB: 1976/01/01, 37 y.o.   MRN: [id number]    [facility]  Patient Active Problem List  Diagnosis  . Normal delivery  . Anemia complicating pregnancy  . Unspecified osteomyelitis, ankle and foot  . Other complications due to other internal orthopedic device, implant, and graft  . Fatigue    Patient's Medications  New Prescriptions   No medications on file  Previous Medications   FERROUS SULFATE 325 (65 FE) MG TABLET    Take 1 tablet (325 mg total) by mouth 2 (two) times daily before a meal.  Modified Medications   No medications on file  Discontinued Medications   MEPERIDINE (DEMEROL) 50 MG TABLET    Take 50 mg by mouth every 6 (six) hours as needed.   PROMETHAZINE (PHENERGAN) 25 MG TABLET    Take 25 mg by mouth every 6 (six) hours as needed.   SULFAMETHOXAZOLE-TRIMETHOPRIM (BACTRIM DS) 800-160 MG PER TABLET    Take 1 tablet by mouth 2 (two) times daily.    Subjective: This Picking is in for her first visit in 2-1/2 months. She developed a Serratia foot infection after surgery on her right great toe last April. She underwent incision and drainage x2 in September of last year. I treated her with oral trimethoprim sulfamethoxazole. She was last here on November 7 at which time her sedimentation rate and C-reactive protein were still elevated but her exam was relatively benign. I asked her to continue taking her antibiotic and follow up in one month but she only comes in today. She think she quit taking the antibiotic around Christmas time. Her foot feels about the same. She still has some discomfort lateral to her healed incision and notes some swelling. This has not changed over the past several months.  She has been more fatigued and wonders if it's do to persistent infection. She has 2 girls age 43 in 61 months. She is also taking 9 hours of schoolwork. She is a single mom and cares for her children and does her schoolwork  in the evening. She states that she has less patience than usual but does not feel depressed. His also had some intermittent nausea. She states that she has not been sexually active in could not be pregnant. She has not missed any periods.  Objective: Temp: 98.3 F (36.8 C) (01/20 1541) Temp src: Oral (01/20 1541) BP: 130/79 mmHg (01/20 1541) Pulse Rate: 102  (01/20 1541)  General: She seems a little overwhelmed but in no other distress. Her body mass index is 44 Skin: No rash Oral: Clear and teeth in good repair Lungs: Clear Cor: Regular S1 and S2 and no murmurs Abdomen: Obese soft and nontender Right foot: Healed incision on the dorsum of her right great toe extending up over the metatarsal. There is no cellulitis or warmth. She has good range of motion of her great toe without any discomfort. There may be some mild swelling over the dorsum of the foot.  Lab Results  Component Value Date   CRP 2.2* 12/16/2011    Lab Results  Component Value Date   ESRSEDRATE 90* 12/16/2011    Assessment: Although her inflammatory markers were still elevated a little over 2 months ago I am not convinced that she has persistent infection today. I will repeat her inflammatory markers as well as a CBC and complete metabolic panel and see her back in one week.  Plan: 1. Observe off of  antibiotics 2. Followup in one week after blood work   Cliffton Asters, MD [facility] [phone] pager   [phone] cell 02/28/2012, 4:06 PM

## 2012-02-29 LAB — SEDIMENTATION RATE: Sed Rate: 74 mm/hr — ABNORMAL HIGH (ref 0–22)

## 2012-03-06 ENCOUNTER — Encounter: Payer: Self-pay | Admitting: Internal Medicine

## 2012-03-06 ENCOUNTER — Ambulatory Visit (INDEPENDENT_AMBULATORY_CARE_PROVIDER_SITE_OTHER): Payer: BC Managed Care – PPO | Admitting: Internal Medicine

## 2012-03-06 VITALS — BP 130/78 | HR 91 | Temp 98.4°F | Ht 64.0 in | Wt 245.5 lb

## 2012-03-06 DIAGNOSIS — O99019 Anemia complicating pregnancy, unspecified trimester: Secondary | ICD-10-CM

## 2012-03-06 DIAGNOSIS — M869 Osteomyelitis, unspecified: Secondary | ICD-10-CM

## 2012-03-06 DIAGNOSIS — D649 Anemia, unspecified: Secondary | ICD-10-CM

## 2012-03-06 NOTE — Progress Notes (Signed)
Patient ID: [name] [name], female   DOB: 1976/01/07, 37 y.o.   MRN: [id number]    [facility]  Patient Active Problem List  Diagnosis  . Normal delivery  . Anemia complicating pregnancy  . Unspecified osteomyelitis, ankle and foot  . Other complications due to other internal orthopedic device, implant, and graft  . Fatigue    Patient's Medications  New Prescriptions   No medications on file  Previous Medications   FERROUS SULFATE 325 (65 FE) MG TABLET    Take 1 tablet (325 mg total) by mouth 2 (two) times daily before a meal.  Modified Medications   No medications on file  Discontinued Medications   No medications on file    Subjective: [name] [name] is in for her routine visit. She continues to have some soreness in her foot but states that it is unchanged. She has not had any fever, chills or sweats. She is not taking her iron supplement or any other medications. She does not have a primary care physician.  Objective: Temp: 98.4 F (36.9 C) (01/27 1439) Temp src: Oral (01/27 1439) BP: 130/78 mmHg (01/27 1439) Pulse Rate: 91  (01/27 1439)  General: Her weight has increased over the past few years and now is 245 pounds with a body mass index over 40 Lungs: Clear Cor: Regular S1 and S2 no murmurs Abdomen: Obese, soft and nontender Right foot: Healed surgical incision over the first metatarsal and base of the great toe without inflammation  Lab Results  Component Value Date   WBC 9.5 02/28/2012   HGB 7.8* 02/28/2012   HCT 28.0* 02/28/2012   MCV 58.3* 02/28/2012   PLT 489* 02/28/2012    Lab Results  Component Value Date   CRP 2.0* 02/28/2012    Lab Results  Component Value Date   ESRSEDRATE 74* 02/28/2012     Assessment: Although her inflammatory markers remain elevated I do not see significant clinical evidence to suggest active infection. I would like to continue observation off of antibiotics for now. I suspect that the residual soreness she  has in her right foot is typical given although she has been through in the past year with multiple surgeries and infection.  I suspect that her fatigue is related more to her progressive microcytic anemia and weight gain. I suspect that she has iron deficiency anemia secondary to menstrual blood loss. I have suggested that she restart her iron supplement and obtain a primary care physician. She is in agreement with that plan.  Plan: 1. Continue observation off of antibiotics 2. Restart iron supplementation 3. Establish primary care 4. Followup here if she develops any new signs or symptoms to suggest recurrent foot infection   Cliffton Asters, MD [facility] [phone] pager   [phone] cell 03/06/2012, 2:54 PM

## 2013-05-10 IMAGING — US US OB US >=[ID] SNGL FETUS
1 series · 13 of 28 positions shown · non-contrast
Comparison: none

REASON FOR EXAM: size dates discrepancy
COMMENTS:

[Series 1: us ob us >=(id) sngl fetus · 0.39mm/px · 13 of 77 slices shown]
[im 3/77]
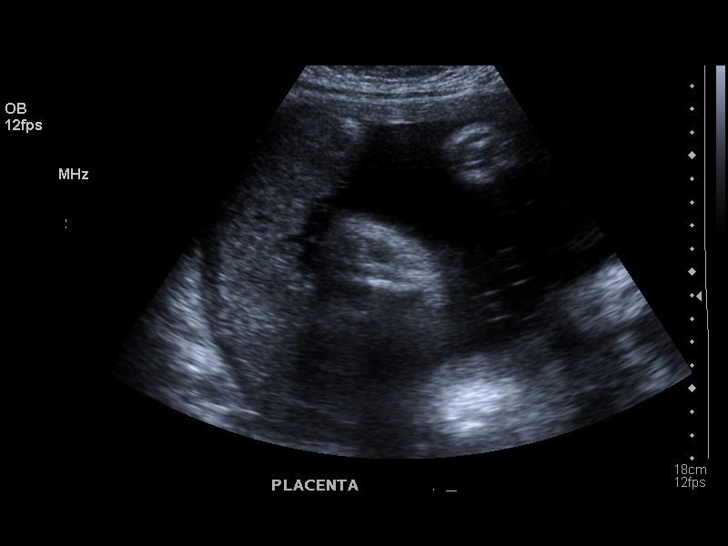
[im 9/77]
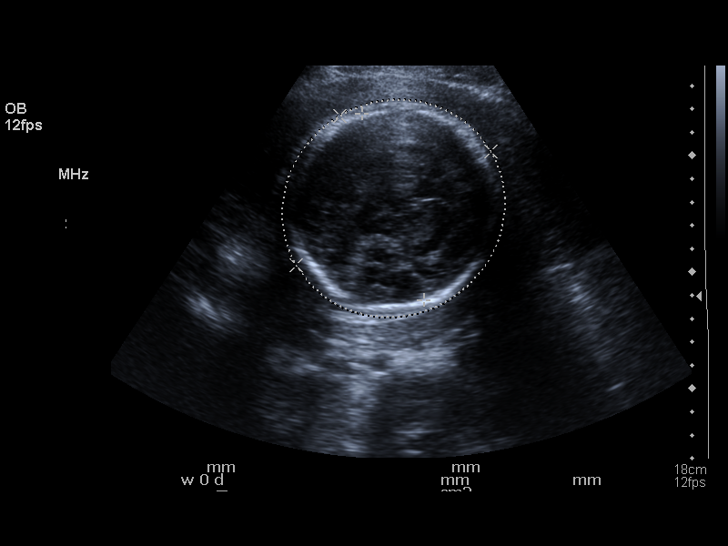
[im 15/77]
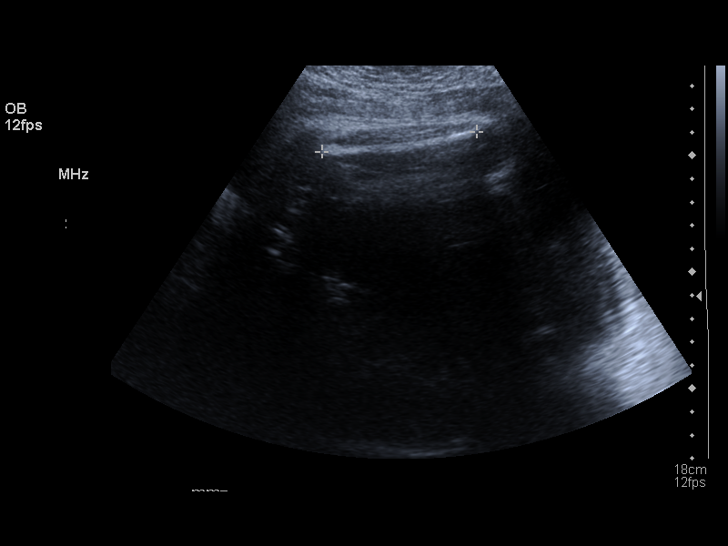
[im 20/77]
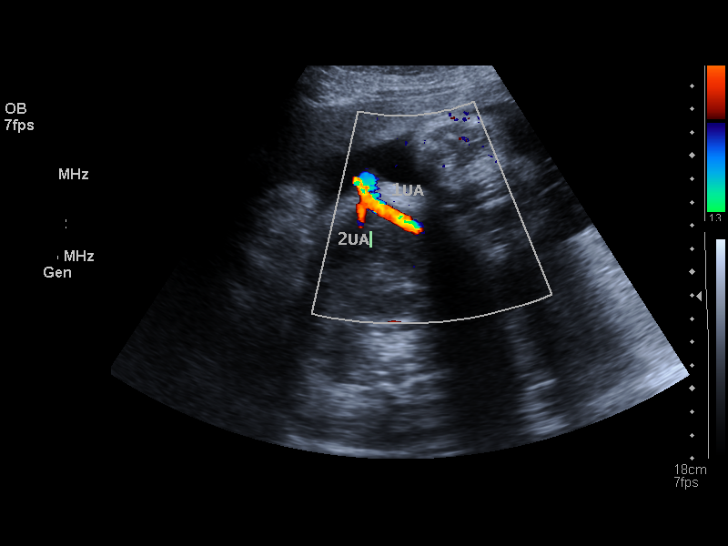
[im 26/77]
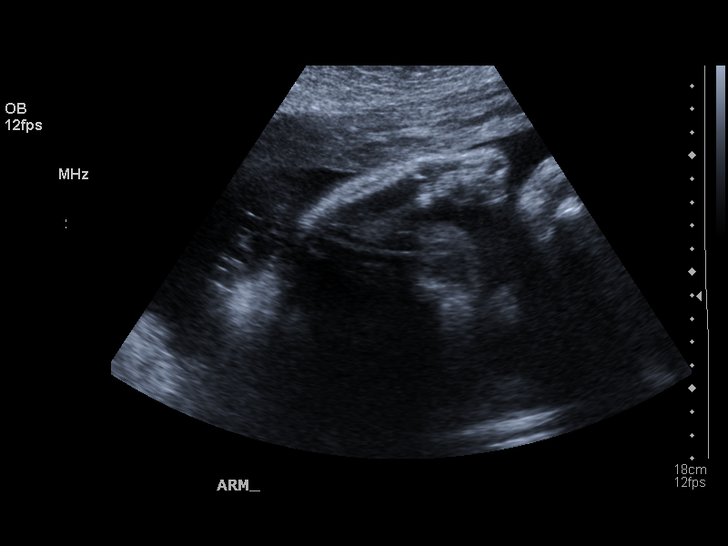
[im 31/77]
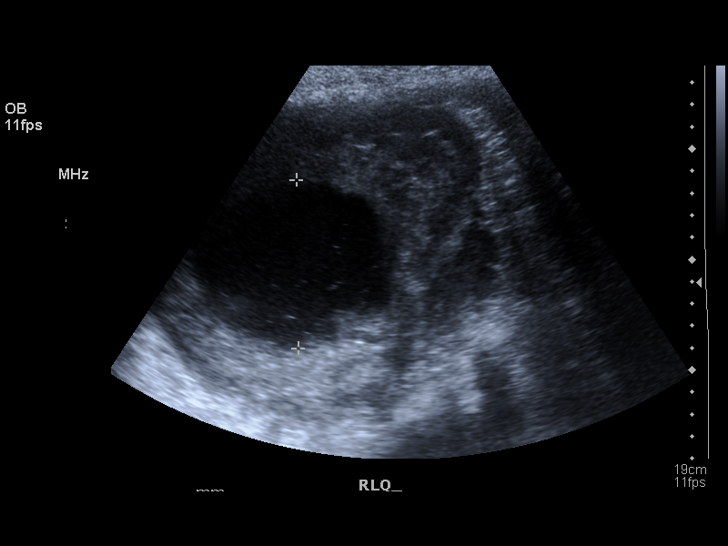
[im 40/77]
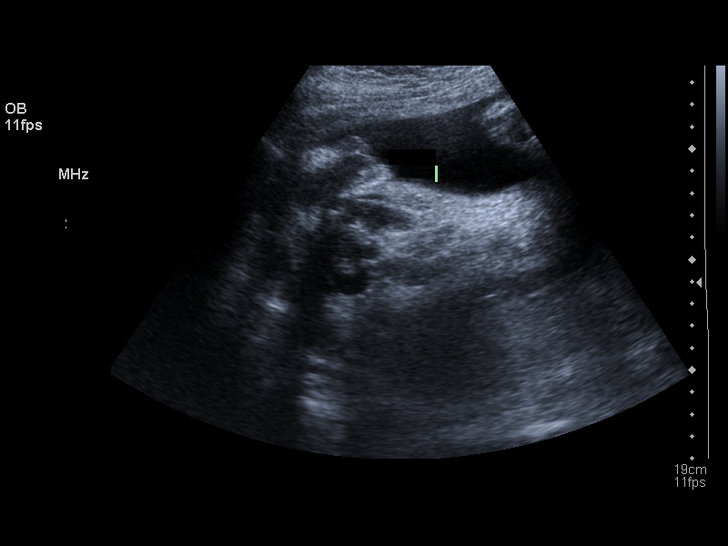
[im 46/77]
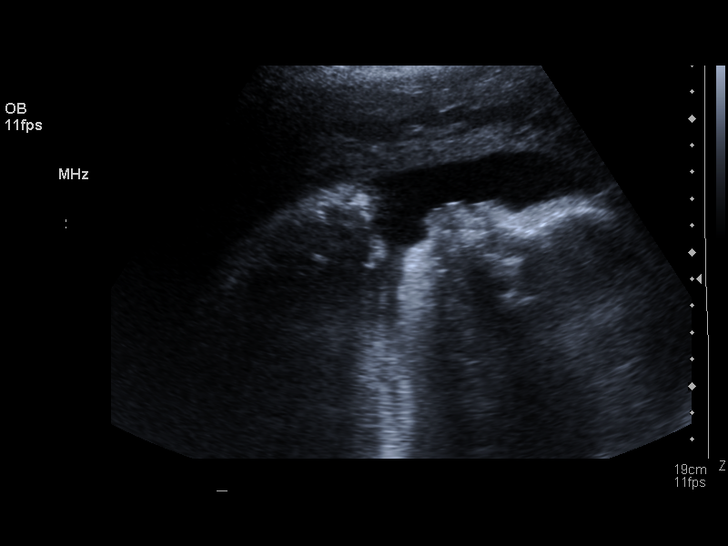
[im 51/77]
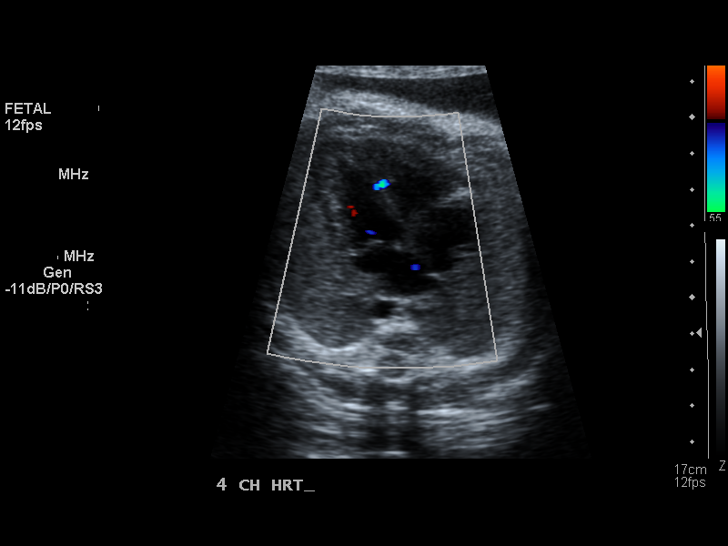
[im 57/77]
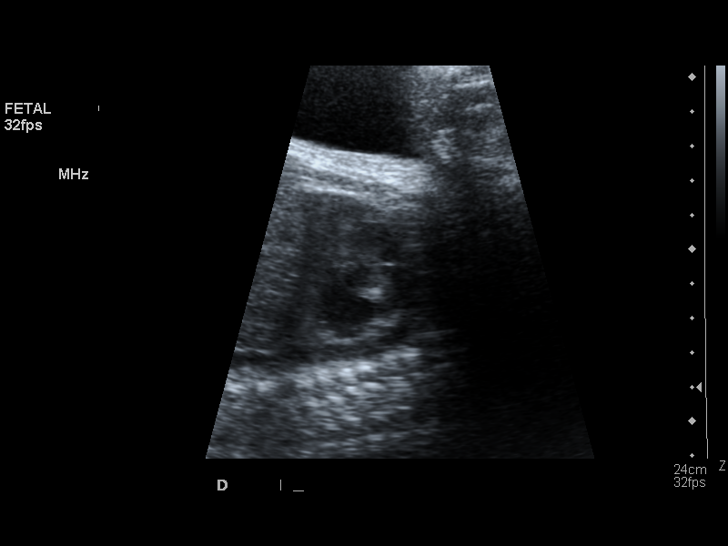
[im 62/77]
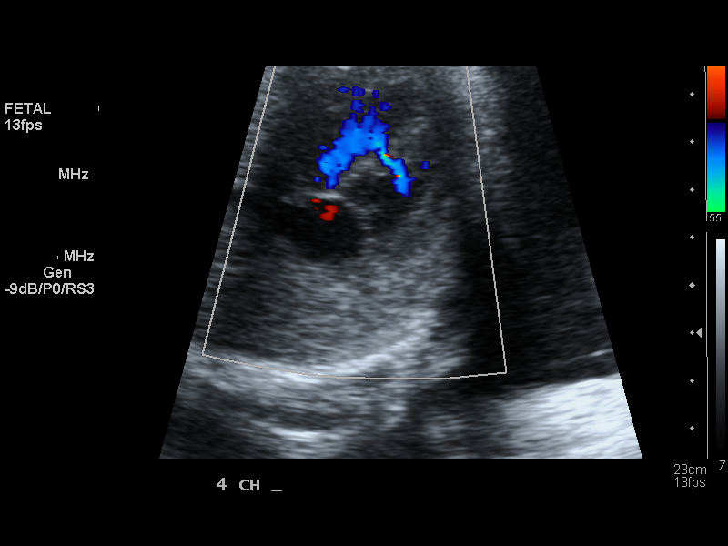
[im 68/77]
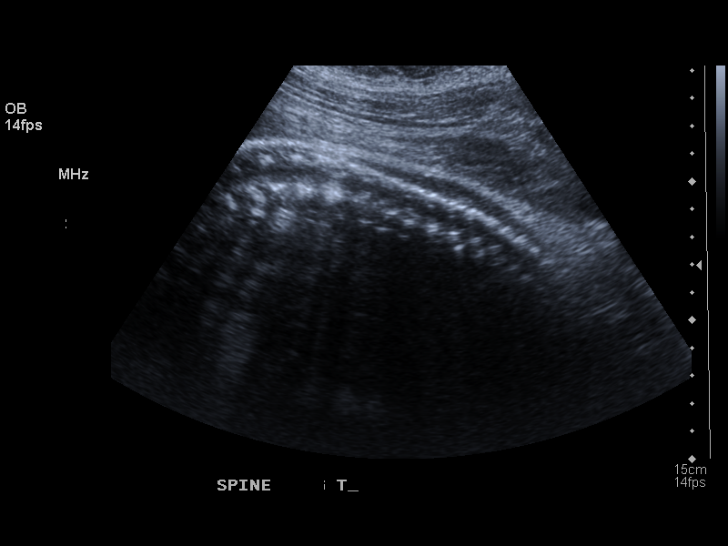
[im 74/77]
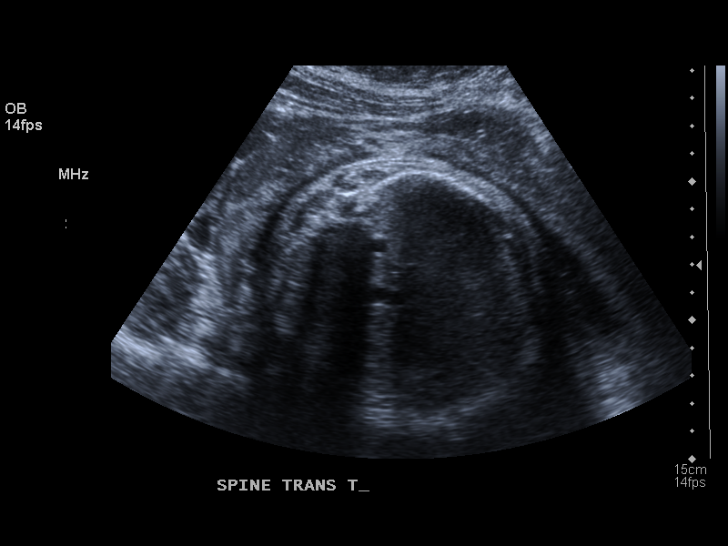

[13 of 28 positions shown; findings below may reference images not displayed]

PROCEDURE:     US  - US OB GREATER/OR EQUAL TO 0P1KL  - October 06, 2010 [DATE]

RESULT:     There is observed single, living intrauterine gestation. Fetal
heart rate was monitored at 158 beats per minute. Presentation currently is
cephalic. Amniotic fluid volume appears normal. The placenta is primarily
fundal and posterior. The inferior tip of the placenta is approximately
cm from the cervix. The fetal stomach, kidneys, and urinary bladder are
visualized with no significant abnormalities noted. No hydrocephalus or
hydronephrosis is observed. There is an apparent ventricular septal defect
within the fetal heart. It is recommended the patient have additional
evaluation at the [facility] to confirm this finding and to
evaluate for additional abnormalities that are not detected on this exam.

Fetal measurements are as follows:

          BPD is 84.4 mm, corresponding to 34 weeks, 0 days
            HC is 299.7 mm, corresponding to 33 weeks, 2 days
            AC is 317.4 mm, corresponding to 35 weeks, 5 days
            FL is 68 mm, corresponding to 35 weeks, 0 days
            HL is 59 mm, corresponding to 34 weeks, 1 day.

EFW is 2,574 grams plus or minus 381 grams. Average ultrasound age, based on
today's examination, is 34 weeks, 3 days. Ultrasound EDD is 11/14/2010.
IMPRESSION: 1.  Please see above.
2.  Possible ventriculoseptal defect. Additional evaluation at the [facility] is suggested.

## 2013-07-13 ENCOUNTER — Telehealth: Payer: Self-pay | Admitting: Hematology and Oncology

## 2013-07-13 NOTE — Telephone Encounter (Signed)
LEFT MESSAGE FOR PATIENT TO RETURN CALL TO SCHEDULE NP APPT.  °

## 2013-07-16 ENCOUNTER — Ambulatory Visit: Payer: BC Managed Care – PPO | Admitting: Hematology and Oncology

## 2013-07-16 ENCOUNTER — Ambulatory Visit: Payer: BC Managed Care – PPO

## 2013-07-16 ENCOUNTER — Telehealth: Payer: Self-pay | Admitting: Hematology and Oncology

## 2013-07-16 NOTE — Telephone Encounter (Signed)
LEFT MESSAGE AND GAVE NEW TIME FOR NP APPT @ 4.

## 2013-07-18 ENCOUNTER — Telehealth: Payer: Self-pay | Admitting: Hematology and Oncology

## 2013-07-18 NOTE — Telephone Encounter (Signed)
S/W PATIENT AND GAVE NP APPT FOR 06/11 @ 12:30 W/DR. [place].  [place] DX-SEV[facility] IDA

## 2013-07-19 ENCOUNTER — Telehealth: Payer: Self-pay | Admitting: Hematology and Oncology

## 2013-07-19 ENCOUNTER — Encounter: Payer: Self-pay | Admitting: Hematology and Oncology

## 2013-07-19 ENCOUNTER — Telehealth: Payer: Self-pay | Admitting: *Deleted

## 2013-07-19 ENCOUNTER — Ambulatory Visit (HOSPITAL_BASED_OUTPATIENT_CLINIC_OR_DEPARTMENT_OTHER): Payer: BC Managed Care – PPO | Admitting: Hematology and Oncology

## 2013-07-19 ENCOUNTER — Ambulatory Visit: Payer: BC Managed Care – PPO

## 2013-07-19 VITALS — BP 106/56 | HR 81 | Temp 98.2°F | Resp 20 | Ht 64.0 in | Wt 244.6 lb

## 2013-07-19 DIAGNOSIS — R519 Headache, unspecified: Secondary | ICD-10-CM

## 2013-07-19 DIAGNOSIS — D509 Iron deficiency anemia, unspecified: Secondary | ICD-10-CM

## 2013-07-19 DIAGNOSIS — R5383 Other fatigue: Secondary | ICD-10-CM

## 2013-07-19 DIAGNOSIS — D696 Thrombocytopenia, unspecified: Secondary | ICD-10-CM

## 2013-07-19 DIAGNOSIS — R51 Headache: Secondary | ICD-10-CM

## 2013-07-19 NOTE — Telephone Encounter (Signed)
Gave pt appt for lab and MD, left Sharyn Lull a VM for IV Iron tomorrow

## 2013-07-19 NOTE — Assessment & Plan Note (Signed)
Due to symptomatic anemia. I recommend avoiding NSAIDS.

## 2013-07-19 NOTE — Telephone Encounter (Signed)
Called pt and left message

## 2013-07-19 NOTE — Telephone Encounter (Signed)
Per staff message and POF I have scheduled appts. Scheduler advised of appts.  JMW

## 2013-07-19 NOTE — Progress Notes (Signed)
[facility] CONSULT NOTE  Patient Care Team: No Pcp Per Patient as PCP - General (General Practice)  CHIEF COMPLAINTS/PURPOSE OF CONSULTATION:  Severe microcytic anemia  HISTORY OF PRESENTING ILLNESS:  [name] [name] 38 y.o. female is here because of microcytic anemia.  She was found to have abnormal CBC from recent blood work for evaluation of fatigue. She denies recent chest pain on exertion, pre-syncopal episodes, or palpitations. She does complain of leg cramps, dizziness, shortness of breath on minimal exertion, and frequent headaches. Recent CBC done last month in May 2015 shows significant anemia with hemoglobin 7.9, MCV of 55 and platelet count of 409. She had not noticed any recent bleeding such as epistaxis, hematuria or hematochezia The patient denies regular over the counter NSAID ingestion. She is not  on antiplatelets agents.  She had no prior history or diagnosis of cancer. Her age appropriate screening programs are up-to-date. She has significant pica with chewing ice and eats a variety of diet. She never donated blood or received blood transfusion The patient was prescribed oral iron supplements and she takes with food. She cannot tolerate oral iron due to nausea.  MEDICAL HISTORY:  Past Medical History  Diagnosis Date  . Headache(784.0)   . Anemia   . PCOS (polycystic ovarian syndrome)     SURGICAL HISTORY: Past Surgical History  Procedure Laterality Date  . Bunionectomy    . Laparoscopic ovarian cystectomy    . Lymph gland excision    . Bunionectomy  05/22/11    right    SOCIAL HISTORY: History   Social History  . Marital Status: Married    Spouse Name: N/A    Number of Children: N/A  . Years of Education: N/A   Occupational History  . Not on file.   Social History Main Topics  . Smoking status: Never Smoker   . Smokeless tobacco: Never Used  . Alcohol Use: Yes     Comment: OCASSIONALLY  . Drug Use: No  . Sexual Activity:  Yes   Other Topics Concern  . Not on file   Social History Narrative  . No narrative on file    FAMILY HISTORY: History reviewed. No pertinent family history.  ALLERGIES:  has No Known Allergies.  MEDICATIONS:  Current Outpatient Prescriptions  Medication Sig Dispense Refill  . Cholecalciferol (VITAMIN D) 2000 UNITS tablet Take 2,000 Units by mouth daily.      . metFORMIN (GLUCOPHAGE-XR) 500 MG 24 hr tablet Take 500 mg by mouth daily.      . ferrous sulfate 325 (65 FE) MG tablet Take 1 tablet (325 mg total) by mouth 2 (two) times daily before a meal.  60 tablet  11   No current facility-administered medications for this visit.    REVIEW OF SYSTEMS:   Constitutional: Denies fevers, chills or abnormal night sweats Eyes: Denies blurriness of vision, double vision or watery eyes Ears, nose, mouth, throat, and face: Denies mucositis or sore throat Cardiovascular: Denies palpitation, chest discomfort or lower extremity swelling Gastrointestinal:  Denies nausea, heartburn or change in bowel habits Skin: Denies abnormal skin rashes Lymphatics: Denies new lymphadenopathy or easy bruising Neurological:Denies numbness, tingling or new weaknesses Behavioral/Psych: Mood is stable, no new changes  All other systems were reviewed with the patient and are negative.  PHYSICAL EXAMINATION: ECOG PERFORMANCE STATUS: 1 - Symptomatic but completely ambulatory  Filed Vitals:   07/19/13 1248  BP: 106/56  Pulse: 81  Temp: 98.2 F (36.8 C)  Resp: 20  Filed Weights   07/19/13 1248  Weight: 244 lb 9.6 oz (110.95 kg)    GENERAL:alert, no distress and comfortable she is morbidly obese SKIN: skin color, texture, turgor are normal, no rashes or significant lesions EYES: normal, conjunctiva are pale and non-injected, sclera clear OROPHARYNX:no exudate, no erythema and lips, buccal mucosa, and tongue normal  NECK: supple, thyroid normal size, non-tender, without nodularity LYMPH:  no palpable  lymphadenopathy in the cervical, axillary or inguinal LUNGS: clear to auscultation and percussion with normal breathing effort HEART: regular rate & rhythm and no murmurs and no lower extremity edema ABDOMEN:abdomen soft, non-tender and normal bowel sounds Musculoskeletal:no cyanosis of digits and no clubbing  PSYCH: alert & oriented x 3 with fluent speech NEURO: no focal motor/sensory deficits  LABORATORY DATA:  I have reviewed the data as listed .  ASSESSMENT & PLAN:  Iron deficiency anemia, unspecified  The most likely cause of her anemia is due to chronic blood loss from recurrent pregnancies and history of menorrhagia. We discussed some of the risks, benefits, and alternatives of intravenous iron infusions. The patient is symptomatic from anemia and the iron level is critically low. She tolerated oral iron supplement poorly and desires to achieved higher levels of iron faster for adequate hematopoesis. Some of the side-effects to be expected including risks of infusion reactions, phlebitis, headaches, nausea and fatigue.  The patient is willing to proceed. Patient education material was dispensed.  Goal is to keep ferritin level greater than 50  I plan to see her back in 6 weeks with repeat blood work 1 week prior to make sure that she has adequate replacement.  Thrombocytopenia, unspecified This is likely reactive thrombocytosis from iron deficiency anemia. Recommend observation only for now.  Headache Due to symptomatic anemia. I recommend avoiding NSAIDS.  Fatigue Due to anemia.      All questions were answered. The patient knows to call the clinic with any problems, questions or concerns.   [facility], Glendon Dunwoody, MD 07/19/2013 9:42 PM

## 2013-07-19 NOTE — Assessment & Plan Note (Signed)
  The most likely cause of her anemia is due to chronic blood loss from recurrent pregnancies and history of menorrhagia. We discussed some of the risks, benefits, and alternatives of intravenous iron infusions. The patient is symptomatic from anemia and the iron level is critically low. She tolerated oral iron supplement poorly and desires to achieved higher levels of iron faster for adequate hematopoesis. Some of the side-effects to be expected including risks of infusion reactions, phlebitis, headaches, nausea and fatigue.  The patient is willing to proceed. Patient education material was dispensed.  Goal is to keep ferritin level greater than 50  I plan to see her back in 6 weeks with repeat blood work 1 week prior to make sure that she has adequate replacement.

## 2013-07-19 NOTE — Assessment & Plan Note (Signed)
Due to anemia.

## 2013-07-19 NOTE — Progress Notes (Signed)
Checked in new patient with no financial issues. She has appt card and [place] been [place].

## 2013-07-19 NOTE — Assessment & Plan Note (Signed)
This is likely reactive thrombocytosis from iron deficiency anemia. Recommend observation only for now.

## 2013-07-20 ENCOUNTER — Telehealth: Payer: Self-pay | Admitting: *Deleted

## 2013-07-20 ENCOUNTER — Ambulatory Visit: Payer: BC Managed Care – PPO

## 2013-07-20 NOTE — Telephone Encounter (Signed)
Patient called and left a message to cancel her appt for today. She wants to reschedule. I have canceled appt for today. I called and left her a message to call me back to reschedule.   JMW

## 2013-07-20 NOTE — Telephone Encounter (Signed)
Patient called and we have scheduled the appt for Monday.  JMW

## 2013-07-23 ENCOUNTER — Ambulatory Visit (HOSPITAL_BASED_OUTPATIENT_CLINIC_OR_DEPARTMENT_OTHER): Payer: BC Managed Care – PPO

## 2013-07-23 ENCOUNTER — Ambulatory Visit: Payer: BC Managed Care – PPO

## 2013-07-23 VITALS — BP 127/57 | HR 82 | Temp 98.4°F | Resp 18

## 2013-07-23 DIAGNOSIS — D509 Iron deficiency anemia, unspecified: Secondary | ICD-10-CM

## 2013-07-23 MED ORDER — DIPHENHYDRAMINE HCL 50 MG/ML IJ SOLN
25.0000 mg | Freq: Once | INTRAMUSCULAR | Status: AC
  Administered 2013-07-23: 25 mg via INTRAVENOUS

## 2013-07-23 MED ORDER — SODIUM CHLORIDE 0.9 % IV SOLN
500.0000 mL | Freq: Once | INTRAVENOUS | Status: AC
  Administered 2013-07-23: 14:00:00 via INTRAVENOUS

## 2013-07-23 MED ORDER — SODIUM CHLORIDE 0.9 % IV SOLN
1020.0000 mg | Freq: Once | INTRAVENOUS | Status: AC
  Administered 2013-07-23: 1020 mg via INTRAVENOUS
  Filled 2013-07-23: qty 34

## 2013-07-23 MED ORDER — METHYLPREDNISOLONE SODIUM SUCC 125 MG IJ SOLR
125.0000 mg | Freq: Once | INTRAMUSCULAR | Status: AC
  Administered 2013-07-23: 125 mg via INTRAVENOUS

## 2013-07-23 MED ORDER — ACETAMINOPHEN 325 MG PO TABS
650.0000 mg | ORAL_TABLET | Freq: Once | ORAL | Status: AC
  Administered 2013-07-23: 650 mg via ORAL

## 2013-07-23 MED ORDER — ACETAMINOPHEN 325 MG PO TABS
ORAL_TABLET | ORAL | Status: AC
  Filled 2013-07-23: qty 2

## 2013-07-23 MED ORDER — SODIUM CHLORIDE 0.9 % IV SOLN
Freq: Once | INTRAVENOUS | Status: AC
  Administered 2013-07-23: 13:00:00 via INTRAVENOUS

## 2013-07-23 MED ORDER — DIPHENHYDRAMINE HCL 50 MG/ML IJ SOLN
INTRAMUSCULAR | Status: AC
  Filled 2013-07-23: qty 1

## 2013-07-23 MED ORDER — METHYLPREDNISOLONE SODIUM SUCC 125 MG IJ SOLR
INTRAMUSCULAR | Status: AC
  Filled 2013-07-23: qty 2

## 2013-07-23 NOTE — Patient Instructions (Signed)

## 2013-07-23 NOTE — Progress Notes (Signed)
1410: Pt reports having a scratchy throat and pain in her back after the feraheme infusion was complete. Vital signs stable. Dr. Alvy Bimler notified and verbal orders received for 500 ml of Normal Saline, 125 mg solumedrol IVP, 25 mg benadryl IVP, and 650 mg tylenol PO.  Meds given and pt reports feeling some relief. Will continue to monitor pt while receiving IVF. Per Dr. Alvy Bimler it is okay to discharge pt home if she is feeling better after the fluids are finished. Marzetta Board, RN

## 2013-08-23 ENCOUNTER — Other Ambulatory Visit: Payer: BC Managed Care – PPO

## 2013-08-27 ENCOUNTER — Telehealth: Payer: Self-pay | Admitting: Hematology and Oncology

## 2013-08-27 NOTE — Telephone Encounter (Signed)
pt called to r/s appts..done pt aware of new d.t °

## 2013-08-28 ENCOUNTER — Telehealth: Payer: Self-pay | Admitting: *Deleted

## 2013-08-28 ENCOUNTER — Other Ambulatory Visit (HOSPITAL_BASED_OUTPATIENT_CLINIC_OR_DEPARTMENT_OTHER): Payer: BC Managed Care – PPO

## 2013-08-28 DIAGNOSIS — R5383 Other fatigue: Secondary | ICD-10-CM

## 2013-08-28 DIAGNOSIS — D509 Iron deficiency anemia, unspecified: Secondary | ICD-10-CM

## 2013-08-28 LAB — IRON AND TIBC CHCC
%SAT: 19 % — AB (ref 21–57)
IRON: 57 ug/dL (ref 41–142)
TIBC: 305 ug/dL (ref 236–444)
UIBC: 248 ug/dL (ref 120–384)

## 2013-08-28 LAB — CBC & DIFF AND RETIC
BASO%: 0.5 % (ref 0.0–2.0)
Basophils Absolute: 0 10*3/uL (ref 0.0–0.1)
EOS%: 2 % (ref 0.0–7.0)
Eosinophils Absolute: 0.2 10*3/uL (ref 0.0–0.5)
HEMATOCRIT: 33 % — AB (ref 34.8–46.6)
HEMOGLOBIN: 9.8 g/dL — AB (ref 11.6–15.9)
Immature Retic Fract: 7.6 % (ref 1.60–10.00)
LYMPH#: 3.5 10*3/uL — AB (ref 0.9–3.3)
LYMPH%: 41.1 % (ref 14.0–49.7)
MCH: 19.6 pg — ABNORMAL LOW (ref 25.1–34.0)
MCHC: 29.8 g/dL — AB (ref 31.5–36.0)
MCV: 65.8 fL — ABNORMAL LOW (ref 79.5–101.0)
MONO#: 0.7 10*3/uL (ref 0.1–0.9)
MONO%: 8.5 % (ref 0.0–14.0)
NEUT#: 4.1 10*3/uL (ref 1.5–6.5)
NEUT%: 47.9 % (ref 38.4–76.8)
Platelets: 320 10*3/uL (ref 145–400)
RBC: 5.01 10*6/uL (ref 3.70–5.45)
RDW: 33.9 % — ABNORMAL HIGH (ref 11.2–14.5)
RETIC CT ABS: 81.66 10*3/uL (ref 33.70–90.70)
Retic %: 1.63 % (ref 0.70–2.10)
WBC: 8.5 10*3/uL (ref 3.9–10.3)

## 2013-08-28 LAB — FERRITIN CHCC: Ferritin: 56 ng/ml (ref 9–269)

## 2013-08-28 NOTE — Telephone Encounter (Signed)
Left message to call.

## 2013-08-28 NOTE — Telephone Encounter (Signed)
Message copied by Patton Salles on Tue Aug 28, 2013  2:52 PM ------      Message from: [facility], [place]: Tue Aug 28, 2013  1:54 PM      Regarding: need IV iron       She will need IV iron again. Can you call if she will have time for repeat IV iron feraheme next week?      I will have to move her appt to 145 pm so that she will have time to get IV iron same day      ----- Message -----         From: [facility]         Sent: 08/28/2013   9:19 AM           To: [name], MD                   ------

## 2013-08-28 NOTE — Telephone Encounter (Signed)
No answer

## 2013-08-28 NOTE — Telephone Encounter (Signed)
Message copied by Patton Salles on Tue Aug 28, 2013  2:15 PM ------      Message from: [facility], [place]: Tue Aug 28, 2013  1:54 PM      Regarding: need IV iron       She will need IV iron again. Can you call if she will have time for repeat IV iron feraheme next week?      I will have to move her appt to 145 pm so that she will have time to get IV iron same day      ----- Message -----         From: [facility]         Sent: 08/28/2013   9:19 AM           To: [name], MD                   ------

## 2013-08-29 ENCOUNTER — Other Ambulatory Visit: Payer: Self-pay | Admitting: *Deleted

## 2013-08-29 ENCOUNTER — Telehealth: Payer: Self-pay | Admitting: *Deleted

## 2013-08-29 ENCOUNTER — Telehealth: Payer: Self-pay | Admitting: Hematology and Oncology

## 2013-08-29 NOTE — Telephone Encounter (Signed)
appts adjusted per 7/22 pof. lmonvm for pt re next appt for 7/28. schedule mailed. per thu this is not pt's first fere inf and it is not required to be in the AM.

## 2013-08-29 NOTE — Telephone Encounter (Signed)
Pt returned nurse call.  Informed pt that she will need IV Iron again as per Dr. Alvy Bimler.  Pt has office visit appt on 09/04/13.  Pt stated she can come for IV Iron same day after office visit.  Message to Dr. Alvy Bimler.

## 2013-08-29 NOTE — Telephone Encounter (Signed)
Per POF staff phone call scheduled appts. Advised schedulers 

## 2013-08-30 ENCOUNTER — Ambulatory Visit: Payer: BC Managed Care – PPO | Admitting: Hematology and Oncology

## 2013-09-03 ENCOUNTER — Other Ambulatory Visit: Payer: Self-pay | Admitting: Hematology and Oncology

## 2013-09-04 ENCOUNTER — Ambulatory Visit (HOSPITAL_BASED_OUTPATIENT_CLINIC_OR_DEPARTMENT_OTHER): Payer: BC Managed Care – PPO

## 2013-09-04 ENCOUNTER — Telehealth: Payer: Self-pay | Admitting: Hematology and Oncology

## 2013-09-04 ENCOUNTER — Ambulatory Visit (HOSPITAL_BASED_OUTPATIENT_CLINIC_OR_DEPARTMENT_OTHER): Payer: BC Managed Care – PPO | Admitting: Hematology and Oncology

## 2013-09-04 ENCOUNTER — Encounter: Payer: Self-pay | Admitting: Hematology and Oncology

## 2013-09-04 VITALS — BP 132/61 | HR 73 | Temp 98.0°F | Resp 19

## 2013-09-04 DIAGNOSIS — D509 Iron deficiency anemia, unspecified: Secondary | ICD-10-CM

## 2013-09-04 MED ORDER — DIPHENHYDRAMINE HCL 25 MG PO CAPS
ORAL_CAPSULE | ORAL | Status: AC
  Filled 2013-09-04: qty 1

## 2013-09-04 MED ORDER — SODIUM CHLORIDE 0.9 % IV SOLN
Freq: Once | INTRAVENOUS | Status: AC
  Administered 2013-09-04: 14:00:00 via INTRAVENOUS

## 2013-09-04 MED ORDER — ACETAMINOPHEN 325 MG PO TABS
ORAL_TABLET | ORAL | Status: AC
  Filled 2013-09-04: qty 2

## 2013-09-04 MED ORDER — FERUMOXYTOL INJECTION 510 MG/17 ML
1020.0000 mg | Freq: Once | INTRAVENOUS | Status: AC
  Administered 2013-09-04: 1020 mg via INTRAVENOUS
  Filled 2013-09-04: qty 34

## 2013-09-04 MED ORDER — DIPHENHYDRAMINE HCL 25 MG PO TABS
25.0000 mg | ORAL_TABLET | Freq: Once | ORAL | Status: AC
  Administered 2013-09-04: 25 mg via ORAL
  Filled 2013-09-04: qty 1

## 2013-09-04 MED ORDER — METHYLPREDNISOLONE SODIUM SUCC 125 MG IJ SOLR
125.0000 mg | Freq: Once | INTRAMUSCULAR | Status: AC
  Administered 2013-09-04: 125 mg via INTRAVENOUS

## 2013-09-04 MED ORDER — ACETAMINOPHEN 325 MG PO TABS
650.0000 mg | ORAL_TABLET | Freq: Once | ORAL | Status: AC
  Administered 2013-09-04: 650 mg via ORAL

## 2013-09-04 MED ORDER — METHYLPREDNISOLONE SODIUM SUCC 125 MG IJ SOLR
INTRAMUSCULAR | Status: AC
  Filled 2013-09-04: qty 2

## 2013-09-04 NOTE — Telephone Encounter (Signed)
, °

## 2013-09-04 NOTE — Progress Notes (Signed)
Patient discharged in no acute distress, tolerated feraheme well; no s/s reaction at this time but voices understanding to call [facility] immediately if change in condition occurs.

## 2013-09-04 NOTE — Patient Instructions (Signed)

## 2013-09-04 NOTE — Assessment & Plan Note (Signed)
The most likely cause of her anemia is due to chronic blood loss. We discussed some of the risks, benefits, and alternatives of intravenous iron infusions. The patient is symptomatic from anemia and the iron level is critically low. She tolerated oral iron supplement poorly and desires to achieved higher levels of iron faster for adequate hematopoesis. Some of the side-effects to be expected including risks of infusion reactions, phlebitis, headaches, nausea and fatigue.  The patient is willing to proceed. Patient education material was dispensed.  Goal is to keep ferritin level greater than 75. I will order her hemoglobinopathy evaluation with the next visit. In the meantime, she will continue on prenatal vitamin. Due to slight reaction to iv iron, I would premedicate her in the future with Benadryl, Tylenol and Solu-Medrol.

## 2013-09-04 NOTE — Progress Notes (Signed)
[name] [name] OFFICE PROGRESS NOTE  SUMMARY OF HEMATOLOGIC HISTORY: [name] [name] was found to have abnormal CBC from recent blood work for evaluation of fatigue. [name] [name] denies recent chest pain on exertion, pre-syncopal episodes, or palpitations. [name] [name] does complain of leg cramps, dizziness, shortness of breath on minimal exertion, and frequent headaches. Recent CBC done last month in May 2015 shows significant anemia with hemoglobin 7.9, MCV of 55 and platelet count of 409. [name] [name] received one dose of intravenous iron on 07/23/2013. After infusion, [name] [name] complained of scratchy throat and developed hives. INTERVAL HISTORY: [name] [name] 38 y.o. female returns for further followup. [name] [name] complained of fatigue. [name] [name] denies further pica. Denies recent bleeding. [name] [name] tolerated prenatal vitamin well. Denies recent headaches.  I have reviewed the past medical history, past surgical history, social history and family history with the patient and they are unchanged from previous note.  ALLERGIES:  has No Known Allergies.  MEDICATIONS:  Current Outpatient Prescriptions  Medication Sig Dispense Refill  . Cholecalciferol (VITAMIN D) 2000 UNITS tablet Take 2,000 Units by mouth daily.      . metFORMIN (GLUCOPHAGE-XR) 500 MG 24 hr tablet Take 500 mg by mouth daily.      . Prenatal Vit-Fe Fumarate-FA (PRENATAL MULTIVITAMIN) TABS tablet Take 1 tablet by mouth daily at 12 noon.       No current facility-administered medications for this visit.     REVIEW OF SYSTEMS:   Constitutional: Denies fevers, chills or night sweats Eyes: Denies blurriness of vision Ears, nose, mouth, throat, and face: Denies mucositis or sore throat Respiratory: Denies cough, dyspnea or wheezes Cardiovascular: Denies palpitation, chest discomfort or lower extremity swelling Gastrointestinal:  Denies nausea, heartburn or change in bowel habits Skin: Denies abnormal skin rashes Lymphatics: Denies new lymphadenopathy or easy  bruising Neurological:Denies numbness, tingling or new weaknesses Behavioral/Psych: Mood is stable, no new changes  All other systems were reviewed with the patient and are negative.  PHYSICAL EXAMINATION: ECOG PERFORMANCE STATUS: 0 - Asymptomatic GENERAL:alert, no distress and comfortable Musculoskeletal:no cyanosis of digits and no clubbing  NEURO: alert & oriented x 3 with fluent speech, no focal motor/sensory deficits  LABORATORY DATA:  I have reviewed the data as listed No results found for this or any previous visit (from the past 48 hour(s)).  Lab Results  Component Value Date   WBC 8.5 08/28/2013   HGB 9.8* 08/28/2013   HCT 33.0* 08/28/2013   MCV 65.8* 08/28/2013   PLT 320 08/28/2013    ASSESSMENT & PLAN:  Iron deficiency anemia, unspecified The most likely cause of her anemia is due to chronic blood loss. We discussed some of the risks, benefits, and alternatives of intravenous iron infusions. The patient is symptomatic from anemia and the iron level is critically low. [name] [name] tolerated oral iron supplement poorly and desires to achieved higher levels of iron faster for adequate hematopoesis. Some of the side-effects to be expected including risks of infusion reactions, phlebitis, headaches, nausea and fatigue.  The patient is willing to proceed. Patient education material was dispensed.  Goal is to keep ferritin level greater than 75. I will order her hemoglobinopathy evaluation with the next visit. In the meantime, [name] [name] will continue on prenatal vitamin. Due to slight reaction to iv iron, I would premedicate her in the future with Benadryl, Tylenol and Solu-Medrol.     All questions were answered. The patient knows to call the clinic with any problems, questions or concerns. No barriers to learning was detected.  I spent  15 minutes counseling the patient face to face. The total time spent in the appointment was 20 minutes and more than 50% was on counseling.     [facility], [place],  MD 09/04/2013 1:53 PM

## 2013-09-05 ENCOUNTER — Ambulatory Visit: Payer: BC Managed Care – PPO

## 2013-10-03 ENCOUNTER — Telehealth: Payer: Self-pay | Admitting: Hematology and Oncology

## 2013-10-03 NOTE — Telephone Encounter (Signed)
pt call to cx appt due to ins problems....she will call back to r/s

## 2013-10-04 ENCOUNTER — Other Ambulatory Visit: Payer: BC Managed Care – PPO

## 2013-12-10 ENCOUNTER — Encounter: Payer: Self-pay | Admitting: Hematology and Oncology

## 2013-12-19 ENCOUNTER — Other Ambulatory Visit: Payer: Self-pay | Admitting: Hematology and Oncology

## 2015-04-01 ENCOUNTER — Other Ambulatory Visit (HOSPITAL_COMMUNITY)
Admission: RE | Admit: 2015-04-01 | Discharge: 2015-04-01 | Disposition: A | Discharge status: Home / Self Care | Payer: BC Managed Care – PPO | Source: Ambulatory Visit | Attending: Obstetrics and Gynecology | Admitting: Obstetrics and Gynecology

## 2015-04-01 ENCOUNTER — Other Ambulatory Visit: Payer: Self-pay | Admitting: Obstetrics and Gynecology

## 2015-04-01 DIAGNOSIS — Z01419 Encounter for gynecological examination (general) (routine) without abnormal findings: Secondary | ICD-10-CM | POA: Insufficient documentation

## 2015-04-01 DIAGNOSIS — Z1151 Encounter for screening for human papillomavirus (HPV): Secondary | ICD-10-CM | POA: Diagnosis not present

## 2015-04-04 LAB — CYTOLOGY - PAP

## 2015-05-22 ENCOUNTER — Ambulatory Visit (HOSPITAL_COMMUNITY)
Admission: EM | Admit: 2015-05-22 | Discharge: 2015-05-22 | Disposition: A | Discharge status: Home / Self Care | Payer: BC Managed Care – PPO | Attending: Family Medicine | Admitting: Family Medicine

## 2015-05-22 ENCOUNTER — Encounter (HOSPITAL_COMMUNITY): Payer: Self-pay | Admitting: Emergency Medicine

## 2015-05-22 DIAGNOSIS — R51 Headache: Secondary | ICD-10-CM

## 2015-05-22 DIAGNOSIS — R0789 Other chest pain: Secondary | ICD-10-CM

## 2015-05-22 DIAGNOSIS — K648 Other hemorrhoids: Secondary | ICD-10-CM

## 2015-05-22 DIAGNOSIS — R42 Dizziness and giddiness: Secondary | ICD-10-CM

## 2015-05-22 DIAGNOSIS — G8929 Other chronic pain: Secondary | ICD-10-CM

## 2015-05-22 MED ORDER — HYDROCORTISONE ACETATE 25 MG RE SUPP: 25.0000 mg | Freq: Two times a day (BID) | RECTAL | Status: DC

## 2015-05-22 MED ORDER — DIBUCAINE 1 % EX OINT: TOPICAL_OINTMENT | Freq: Three times a day (TID) | CUTANEOUS | Status: DC | PRN

## 2015-05-22 NOTE — ED Provider Notes (Signed)
CSN: [id number]     Arrival date & time 05/22/15  [id number] History   First MD Initiated Contact with Patient 05/22/15 2034     Chief Complaint  Patient presents with  . Dizziness   (Consider location/radiation/quality/duration/timing/severity/associated sxs/prior Treatment) HPI Comments: 40 year old female who comes in with several complaints that has been occurring for the past 6-12 months. She comes in to the urgent care tonight because she just got insurance. There are no acute complaints other than blood in the stool that occurred this morning when straining having a bowel movement. She states she felt something rip. She has a history of anal fissures. We will refer complaint is that of lightheadedness particularly upon standing. Sometimes she will become dizziness. She has a history of headaches. She has a mild headache now. She is also complaining of upper mid chest pain that is considered mild to her, intermittent and sometimes felt with movement of the arms and torso.  History of headaches, anemia and PCOS   Past Medical History  Diagnosis Date  . Headache(784.0)   . Anemia   . PCOS (polycystic ovarian syndrome)    Past Surgical History  Procedure Laterality Date  . Bunionectomy    . Laparoscopic ovarian cystectomy    . Lymph gland excision    . Bunionectomy  05/22/11    right   No family history on file. Social History  Substance Use Topics  . Smoking status: Never Smoker   . Smokeless tobacco: Never Used  . Alcohol Use: Yes     Comment: OCASSIONALLY   OB History    Gravida Para Term Preterm AB TAB SAB Ectopic Multiple Living   3 2 1 1 1  1   2      Review of Systems  Constitutional: Negative for fever, chills, activity change and fatigue.  Eyes: Negative.   Respiratory: Negative for cough, chest tightness and shortness of breath.   Cardiovascular: Positive for chest pain.  Gastrointestinal: Positive for blood in stool and anal bleeding. Negative for vomiting,  abdominal pain and abdominal distention.  Genitourinary: Negative.   Musculoskeletal: Negative for back pain, arthralgias, gait problem and neck pain.  Skin: Negative.   Neurological: Positive for dizziness, light-headedness and headaches. Negative for tremors, syncope, facial asymmetry and speech difficulty.  Psychiatric/Behavioral: Negative.     Allergies  Feraheme  Home Medications   Prior to Admission medications   Medication Sig Start Date End Date Taking? Authorizing Provider  Cholecalciferol (VITAMIN D) 2000 UNITS tablet Take 2,000 Units by mouth daily.    Historical Provider, MD  dibucaine (NUPERCAINAL) 1 % ointment Apply topically 3 (three) times daily as needed for pain. 05/22/15   Janne Napoleon, NP  hydrocortisone (ANUSOL-HC) 25 MG suppository Place 1 suppository (25 mg total) rectally 2 (two) times daily. 05/22/15   Janne Napoleon, NP  metFORMIN (GLUCOPHAGE-XR) 500 MG 24 hr tablet Take 500 mg by mouth daily. 07/13/13   Historical Provider, MD  Prenatal Vit-Fe Fumarate-FA (PRENATAL MULTIVITAMIN) TABS tablet Take 1 tablet by mouth daily at 12 noon.    Historical Provider, MD   Meds Ordered and Administered this Visit  Medications - No data to display  BP 151/76 mmHg  Pulse 78  Temp(Src) 98.7 F (37.1 C) (Oral)  Resp 16  SpO2 100%  LMP 05/05/2015 No data found.   Physical Exam  Constitutional: She is oriented to person, place, and time. She appears well-developed and well-nourished. No distress.  Obese  HENT:  Head: Normocephalic and atraumatic.  Mouth/Throat: Oropharynx is clear and moist. No oropharyngeal exudate.  Bilateral TMs are normal  Eyes: Conjunctivae and EOM are normal. Pupils are equal, round, and reactive to light. Right eye exhibits no discharge. Left eye exhibits no discharge.  Neck: Normal range of motion. Neck supple.  Cardiovascular: Normal rate, regular rhythm and normal heart sounds.   Pulmonary/Chest: Effort normal and breath sounds normal. No  respiratory distress. She has no wheezes. She has no rales.  Abdominal: Soft. There is no tenderness.  Genitourinary:  Rhona Raider RN present. Rectal exam. There are at least old hemorrhoidal tags. DRE reveals tenderness at the entrance of the anus. There is a cord like structure at the basilar inferior portion of the anus which is exquisitely tender. No evidence of fissure. No apparent bleeding. Normal sphincter tone.  Musculoskeletal: Normal range of motion. She exhibits no edema.  Lymphadenopathy:    She has no cervical adenopathy.  Neurological: She is alert and oriented to person, place, and time. No cranial nerve deficit.  Skin: Skin is warm and dry. She is not diaphoretic.  Psychiatric: She has a normal mood and affect.  Nursing note and vitals reviewed.   ED Course  Procedures (including critical care time)  Labs Review Labs Reviewed - No data to display  Imaging Review No results found.   Visual Acuity Review  Right Eye Distance:   Left Eye Distance:   Bilateral Distance:    Right Eye Near:   Left Eye Near:    Bilateral Near:         MDM   1. Chest wall pain   2. Chronic nonintractable headache, unspecified headache type   3. Positional lightheadedness   4. Hemorrhoids, internal, with bleeding    Use the suppositories in ointment as prescribed. Recommend using Colace stool softener daily.Lots of water. Meds ordered this encounter  Medications  . hydrocortisone (ANUSOL-HC) 25 MG suppository    Sig: Place 1 suppository (25 mg total) rectally 2 (two) times daily.    Dispense:  12 suppository    Refill:  1    Order Specific Question:  Supervising Provider    Answer:  Ihor Gully D [id number]  . dibucaine (NUPERCAINAL) 1 % ointment    Sig: Apply topically 3 (three) times daily as needed for pain.    Dispense:  30 g    Refill:  0    Order Specific Question:  Supervising Provider    Answer:  Billy Fischer [id number]   Call the telephone numbers in your  instructions to help obtain a PCP. Wants. Her PCP can have additional studies lab work as well as time to address your clinical issues. Read instructions to help understand some of her symptoms.    Janne Napoleon, NP 05/22/15 2127

## 2015-05-22 NOTE — Discharge Instructions (Signed)
Chest Wall Pain Chest wall pain is pain in or around the bones and muscles of your chest. Sometimes, an injury causes this pain. Sometimes, the cause may not be known. This pain may take several weeks or longer to get better. HOME CARE Pay attention to any changes in your symptoms. Take these actions to help with your pain:  Rest as told by your doctor.  Avoid activities that cause pain. Try not to use your chest, belly (abdominal), or side muscles to lift heavy things.  If directed, apply ice to the painful area:  Put ice in a plastic bag.  Place a towel between your skin and the bag.  Leave the ice on for 20 minutes, 2-3 times per day.  Take over-the-counter and prescription medicines only as told by your doctor.  Do not use tobacco products, including cigarettes, chewing tobacco, and e-cigarettes. If you need help quitting, ask your doctor.  Keep all follow-up visits as told by your doctor. This is important. GET HELP IF:  You have a fever.  Your chest pain gets worse.  You have new symptoms. GET HELP RIGHT AWAY IF:  You feel sick to your stomach (nauseous) or you throw up (vomit).  You feel sweaty or light-headed.  You have a cough with phlegm (sputum) or you cough up blood.  You are short of breath.   This information is not intended to replace advice given to you by your health care provider. Make sure you discuss any questions you have with your health care provider.   Document Released: 07/14/2007 Document Revised: 10/16/2014 Document Reviewed: 04/22/2014 Elsevier Interactive Patient Education 2016 [employer]  Dizziness Dizziness is a common problem. It makes you feel unsteady or lightheaded. You may feel like you are about to pass out (faint). Dizziness can lead to injury if you stumble or fall. Anyone can get dizzy, but dizziness is more common in older adults. This condition can be caused by a number of things,  including:  Medicines.  Dehydration.  Illness. HOME CARE Following these instructions may help with your condition: Eating and Drinking  Drink enough fluid to keep your pee (urine) clear or pale yellow. This helps to keep you from getting dehydrated. Try to drink more clear fluids, such as water.  Do not drink alcohol.  Limit how much caffeine you drink or eat if told by your doctor.  Limit how much salt you drink or eat if told by your doctor. Activity  Avoid making quick movements.  When you stand up from sitting in a chair, steady yourself until you feel okay.  In the morning, first sit up on the side of the bed. When you feel okay, stand slowly while you hold onto something. Do this until you know that your balance is fine.  Move your legs often if you need to stand in one place for a long time. Tighten and relax your muscles in your legs while you are standing.  Do not drive or use heavy machinery if you feel dizzy.  Avoid bending down if you feel dizzy. Place items in your home so that they are easy for you to reach without leaning over. Lifestyle  Do not use any tobacco products, including cigarettes, chewing tobacco, or electronic cigarettes. If you need help quitting, ask your doctor.  Try to lower your stress level, such as with yoga or meditation. Talk with your doctor if you need help. General Instructions  Watch your dizziness for any changes.  Take  medicines only as told by your doctor. Talk with your doctor if you think that your dizziness is caused by a medicine that you are taking.  Tell a friend or a family member that you are feeling dizzy. If he or she notices any changes in your behavior, have this person call your doctor.  Keep all follow-up visits as told by your doctor. This is important. GET HELP IF:  Your dizziness does not go away.  Your dizziness or light-headedness gets worse.  You feel sick to your stomach (nauseous).  You have  trouble hearing.  You have new symptoms.  You are unsteady on your feet or you feel like the room is spinning. GET HELP RIGHT AWAY IF:  You throw up (vomit) or have diarrhea and are unable to eat or drink anything.  You have trouble:  Talking.  Walking.  Swallowing.  Using your arms, hands, or legs.  You feel generally weak.  You are not thinking clearly or you have trouble forming sentences. It may take a friend or family member to notice this.  You have:  Chest pain.  Pain in your belly (abdomen).  Shortness of breath.  Sweating.  Your vision changes.  You are bleeding.  You have a headache.  You have neck pain or a stiff neck.  You have a fever.   This information is not intended to replace advice given to you by your health care provider. Make sure you discuss any questions you have with your health care provider.   Document Released: 01/14/2011 Document Revised: 06/11/2014 Document Reviewed: 01/21/2014 Elsevier Interactive Patient Education 2016 [employer].  Hemorrhoids Use the suppositories in ointment as prescribed. Recommend using Colace stool softener daily.Lots of water. Hemorrhoids are puffy (swollen) veins around the rectum or anus. Hemorrhoids can cause pain, itching, bleeding, or irritation. HOME CARE  Eat foods with fiber, such as whole grains, beans, nuts, fruits, and vegetables. Ask your doctor about taking products with added fiber in them (fibersupplements).  Drink enough fluid to keep your pee (urine) clear or pale yellow.  Exercise often.  Go to the bathroom when you have the urge to poop. Do not wait.  Avoid straining to poop (bowel movement).  Keep the butt area dry and clean. Use wet toilet paper or moist paper towels.  Medicated creams and medicine inserted into the anus (anal suppository) may be used or applied as told.  Only take medicine as told by your doctor.  Take a warm water bath (sitz bath) for 15-20 minutes  to ease pain. Do this 3-4 times a day.  Place ice packs on the area if it is tender or puffy. Use the ice packs between the warm water baths.  Put ice in a plastic bag.  Place a towel between your skin and the bag.  Leave the ice on for 15-20 minutes, 03-04 times a day.  Do not use a donut-shaped pillow or sit on the toilet for a long time. GET HELP RIGHT AWAY IF:   You have more pain that is not controlled by treatment or medicine.  You have bleeding that will not stop.  You have trouble or are unable to poop (bowel movement).  You have pain or puffiness outside the area of the hemorrhoids. MAKE SURE YOU:   Understand these instructions.  Will watch your condition.  Will get help right away if you are not doing well or get worse.   This information is not intended to replace advice given  to you by your health care provider. Make sure you discuss any questions you have with your health care provider.   Document Released: 11/04/2007 Document Revised: 01/12/2012 Document Reviewed: 12/07/2011 Elsevier Interactive Patient Education [employer].

## 2015-05-22 NOTE — ED Notes (Signed)
Patient has multiple complaints.  Patient reports some of her concerns have been going on for 6 months at least.  The only difference is she now has insurance.  Patient reports headaches, dizziness, and lightheadedness for 6 months.   History of anemia

## 2015-05-22 NOTE — ED Notes (Signed)
At beside for providers exam of rectal pain

## 2015-07-24 ENCOUNTER — Other Ambulatory Visit: Payer: Self-pay | Admitting: Hematology and Oncology

## 2015-07-24 ENCOUNTER — Telehealth: Payer: Self-pay | Admitting: Hematology and Oncology

## 2015-07-24 ENCOUNTER — Telehealth: Payer: Self-pay | Admitting: *Deleted

## 2015-07-24 DIAGNOSIS — D509 Iron deficiency anemia, unspecified: Secondary | ICD-10-CM

## 2015-07-24 NOTE — Telephone Encounter (Signed)
TC from Dr. Coralyn Mark, who saw this patient  Today. She states that patient has been seen here before by  Dr. Alvy Bimler,  for iron infusions. Last visit was in July of 2015.  Today pt's HGB was 8.3 and is feeling light headed. Dr. Coralyn Mark believes that pt is in need of another round of iron infusions. Pt has not been taking her oral iron as directed.  Dr. Coralyn Mark will be faxing pt's CBC to Dr. Alvy Bimler and is requesting that patient be seen here as soon as possible.    Pt can be reached on her mobile # [phone]

## 2015-07-24 NOTE — Telephone Encounter (Signed)
left msg confirming 6/16 lab apt & 6/21 apts

## 2015-07-24 NOTE — Telephone Encounter (Signed)
TC to patient's cell # . No answer LM for patient to return this call for appt dates and times.

## 2015-07-24 NOTE — Telephone Encounter (Signed)
I place POF for her to come in tomorrow for blood work, see me on Wednesday next week and IV iron on Wednesday. That is the earliest I can accomplish for her.

## 2015-07-25 ENCOUNTER — Other Ambulatory Visit (HOSPITAL_BASED_OUTPATIENT_CLINIC_OR_DEPARTMENT_OTHER): Payer: BC Managed Care – PPO

## 2015-07-25 DIAGNOSIS — D509 Iron deficiency anemia, unspecified: Secondary | ICD-10-CM | POA: Diagnosis not present

## 2015-07-25 LAB — CBC & DIFF AND RETIC
BASO%: 0.2 % (ref 0.0–2.0)
BASOS ABS: 0 10*3/uL (ref 0.0–0.1)
EOS ABS: 0.3 10*3/uL (ref 0.0–0.5)
EOS%: 3 % (ref 0.0–7.0)
HEMATOCRIT: 27.1 % — AB (ref 34.8–46.6)
HEMOGLOBIN: 7.9 g/dL — AB (ref 11.6–15.9)
Immature Retic Fract: 20.7 % — ABNORMAL HIGH (ref 1.60–10.00)
LYMPH#: 3.6 10*3/uL — AB (ref 0.9–3.3)
LYMPH%: 38.9 % (ref 14.0–49.7)
MCH: 16.7 pg — AB (ref 25.1–34.0)
MCHC: 29.2 g/dL — ABNORMAL LOW (ref 31.5–36.0)
MCV: 57.2 fL — AB (ref 79.5–101.0)
MONO#: 0.8 10*3/uL (ref 0.1–0.9)
MONO%: 8.6 % (ref 0.0–14.0)
NEUT#: 4.5 10*3/uL (ref 1.5–6.5)
NEUT%: 49.3 % (ref 38.4–76.8)
Platelets: 349 10*3/uL (ref 145–400)
RBC: 4.74 10*6/uL (ref 3.70–5.45)
RDW: 20.7 % — AB (ref 11.2–14.5)
Retic %: 1.9 % (ref 0.70–2.10)
Retic Ct Abs: 90.06 10*3/uL (ref 33.70–90.70)
WBC: 9.1 10*3/uL (ref 3.9–10.3)

## 2015-07-25 LAB — TECHNOLOGIST REVIEW

## 2015-07-25 LAB — FERRITIN: Ferritin: 6 ng/ml — ABNORMAL LOW (ref 9–269)

## 2015-07-30 ENCOUNTER — Ambulatory Visit (HOSPITAL_BASED_OUTPATIENT_CLINIC_OR_DEPARTMENT_OTHER): Payer: BC Managed Care – PPO | Admitting: Hematology and Oncology

## 2015-07-30 ENCOUNTER — Telehealth: Payer: Self-pay | Admitting: Hematology and Oncology

## 2015-07-30 ENCOUNTER — Encounter: Payer: Self-pay | Admitting: Hematology and Oncology

## 2015-07-30 ENCOUNTER — Ambulatory Visit (HOSPITAL_BASED_OUTPATIENT_CLINIC_OR_DEPARTMENT_OTHER): Payer: BC Managed Care – PPO

## 2015-07-30 VITALS — BP 154/79 | HR 73 | Temp 98.7°F | Resp 18

## 2015-07-30 VITALS — BP 149/85 | HR 81 | Temp 98.2°F | Resp 18 | Ht 64.0 in | Wt 251.7 lb

## 2015-07-30 DIAGNOSIS — D509 Iron deficiency anemia, unspecified: Secondary | ICD-10-CM

## 2015-07-30 MED ORDER — SODIUM CHLORIDE 0.9 % IV SOLN
Freq: Once | INTRAVENOUS | Status: AC
  Administered 2015-07-30: 09:00:00 via INTRAVENOUS

## 2015-07-30 MED ORDER — SODIUM CHLORIDE 0.9 % IV SOLN
510.0000 mg | Freq: Once | INTRAVENOUS | Status: AC
  Administered 2015-07-30: 510 mg via INTRAVENOUS
  Filled 2015-07-30: qty 17

## 2015-07-30 MED ORDER — METHYLPREDNISOLONE SODIUM SUCC 125 MG IJ SOLR
INTRAMUSCULAR | Status: AC
  Filled 2015-07-30: qty 2

## 2015-07-30 MED ORDER — METHYLPREDNISOLONE SODIUM SUCC 125 MG IJ SOLR
125.0000 mg | Freq: Every day | INTRAMUSCULAR | Status: DC
  Administered 2015-07-30: 125 mg via INTRAVENOUS

## 2015-07-30 NOTE — Patient Instructions (Signed)

## 2015-07-30 NOTE — Telephone Encounter (Signed)
Added appt per pof pt aware to get sched from chemo

## 2015-07-30 NOTE — Progress Notes (Signed)
Pt tolerated Feraheme without complaints.

## 2015-07-30 NOTE — Assessment & Plan Note (Signed)
The most likely cause of her anemia is due to chronic blood loss/malabsorption syndrome. We discussed some of the risks, benefits, and alternatives of intravenous iron infusions. The patient is symptomatic from anemia and the iron level is critically low. She tolerated oral iron supplement poorly and desires to achieved higher levels of iron faster for adequate hematopoesis. Some of the side-effects to be expected including risks of infusion reactions, phlebitis, headaches, nausea and fatigue.  The patient is willing to proceed. Patient education material was dispensed.  Goal is to keep ferritin level greater than 50 Previously, she had mild reaction to Feraheme. With Solu-Medrol, she was able to tolerate her treatment. I will premedicate her with Solu-Medrol prior to each infusion. I also discussed with the possibility of offering her blood transfusion. We discussed risk and benefit of blood transfusion and she ultimately felt best to proceed with IV iron for now. I recommend she takes prenatal vitamin for folic acid and vitamin B-12 supplementation for effective erythropoiesis. I plan to repeat her blood work within a month with possible more IV iron. My goal would be to normalize her hemoglobin level or at least get her above 10

## 2015-07-30 NOTE — Progress Notes (Signed)
[place], [place] SUMMARY OF HEMATOLOGIC HISTORY:  She was found to have abnormal CBC from recent blood work for evaluation of fatigue. She denies recent chest pain on exertion, pre-syncopal episodes, or palpitations. She does complain of leg cramps, dizziness, shortness of breath on minimal exertion, and frequent headaches. Recent CBC done last month in May 2015 shows significant anemia with hemoglobin 7.9, MCV of 55 and platelet count of 409. She received one dose of intravenous iron on 07/23/2013. After infusion, she complained of scratchy throat and developed hives. Subsequently, she received premedication with Solu-Medrol and she was able to complete her treatment in July 2015 INTERVAL HISTORY: [name] [name] 40 y.o. female returns for referral for recurrent iron deficiency anemia. The patient was lost to follow-up since 2015. She stated that she has lost her insurance and for that reason was not able to keep appointments. For the past 6 months, she started to have progressive symptoms of leg cramps, dizziness, shortness of breath and frequent headaches. She has been taking over-the-counter ibuprofen for relief. She also has pica with excessive chewing of ice. She denies excessive menorrhagia. The patient denies any recent signs or symptoms of bleeding such as spontaneous epistaxis, hematuria or hematochezia. The patient eats a variety of diet including meat. She was prescribed oral iron supplement but she could not tolerate it due to severe dyspepsia. She was referred here back for consideration for further IV iron infusion. I have reviewed the past medical history, past surgical history, social history and family history with the patient and they are unchanged from previous note.  ALLERGIES:  is allergic to feraheme.  MEDICATIONS:  Current Outpatient Prescriptions  Medication Sig Dispense Refill  . Cholecalciferol (VITAMIN D)  2000 UNITS tablet Take 2,000 Units by mouth daily.    . rizatriptan (MAXALT-MLT) 10 MG disintegrating tablet Take 10 mg by mouth as needed for migraine. May repeat in 2 hours if needed     No current facility-administered medications for this visit.     REVIEW OF SYSTEMS:   Constitutional: Denies fevers, chills or night sweats Eyes: Denies blurriness of vision Ears, nose, mouth, throat, and face: Denies mucositis or sore throat Gastrointestinal:  Denies nausea, heartburn or change in bowel habits Skin: Denies abnormal skin rashes Lymphatics: Denies new lymphadenopathy or easy bruising Neurological:Denies numbness, tingling or new weaknesses Behavioral/Psych: Mood is stable, no new changes  All other systems were reviewed with the patient and are negative.  PHYSICAL EXAMINATION: ECOG PERFORMANCE STATUS: 1 - Symptomatic but completely ambulatory  Filed Vitals:   07/30/15 0820  BP: 149/85  Pulse: 81  Temp: 98.2 F (36.8 C)  Resp: 18   Filed Weights   07/30/15 0820  Weight: 251 lb 11.2 oz (114.17 kg)    GENERAL:alert, no distress and comfortable. She is obese SKIN: skin color, texture, turgor are normal, no rashes or significant lesions EYES: normal, Conjunctiva are pale and non-injected, sclera clear OROPHARYNX:no exudate, no erythema and lips, buccal mucosa, and tongue normal  NECK: supple, thyroid normal size, non-tender, without nodularity LYMPH:  no palpable lymphadenopathy in the cervical, axillary or inguinal LUNGS: clear to auscultation and percussion with normal breathing effort HEART: regular rate & rhythm and no murmurs and no lower extremity edema ABDOMEN:abdomen soft, non-tender and normal bowel sounds Musculoskeletal:no cyanosis of digits and no clubbing  NEURO: alert & oriented x 3 with fluent speech, no focal motor/sensory deficits  LABORATORY DATA:  I have reviewed the  data as listed     Component Value Date/Time   NA 137 02/28/2012 1607   K 4.2  02/28/2012 1607   CL 103 02/28/2012 1607   CO2 25 02/28/2012 1607   GLUCOSE 81 02/28/2012 1607   BUN 16 02/28/2012 1607   CREATININE 0.60 02/28/2012 1607   CALCIUM 9.3 02/28/2012 1607   PROT 7.4 02/28/2012 1607   ALBUMIN 4.2 02/28/2012 1607   AST 13 02/28/2012 1607   ALT 11 02/28/2012 1607   ALKPHOS 57 02/28/2012 1607   BILITOT 0.2* 02/28/2012 1607    No results found for: SPEP, UPEP  Lab Results  Component Value Date   WBC 9.1 07/25/2015   NEUTROABS 4.5 07/25/2015   HGB 7.9* 07/25/2015   HCT 27.1* 07/25/2015   MCV 57.2* 07/25/2015   PLT 349 Large platelets present 07/25/2015      Chemistry      Component Value Date/Time   NA 137 02/28/2012 1607   K 4.2 02/28/2012 1607   CL 103 02/28/2012 1607   CO2 25 02/28/2012 1607   BUN 16 02/28/2012 1607   CREATININE 0.60 02/28/2012 1607      Component Value Date/Time   CALCIUM 9.3 02/28/2012 1607   ALKPHOS 57 02/28/2012 1607   AST 13 02/28/2012 1607   ALT 11 02/28/2012 1607   BILITOT 0.2* 02/28/2012 1607      ASSESSMENT & PLAN:  Iron deficiency anemia, unspecified The most likely cause of her anemia is due to chronic blood loss/malabsorption syndrome. We discussed some of the risks, benefits, and alternatives of intravenous iron infusions. The patient is symptomatic from anemia and the iron level is critically low. She tolerated oral iron supplement poorly and desires to achieved higher levels of iron faster for adequate hematopoesis. Some of the side-effects to be expected including risks of infusion reactions, phlebitis, headaches, nausea and fatigue.  The patient is willing to proceed. Patient education material was dispensed.  Goal is to keep ferritin level greater than 50 Previously, she had mild reaction to Feraheme. With Solu-Medrol, she was able to tolerate her treatment. I will premedicate her with Solu-Medrol prior to each infusion. I also discussed with the possibility of offering her blood transfusion. We  discussed risk and benefit of blood transfusion and she ultimately felt best to proceed with IV iron for now. I recommend she takes prenatal vitamin for folic acid and vitamin B-12 supplementation for effective erythropoiesis. I plan to repeat her blood work within a month with possible more IV iron. My goal would be to normalize her hemoglobin level or at least get her above 10   All questions were answered. The patient knows to call the clinic with any problems, questions or concerns. No barriers to learning was detected.  I spent 20 minutes counseling the patient face to face. The total time spent in the appointment was 25 minutes and more than 50% was on counseling.     [facility], Terrion Poblano, MD 6/21/20178:52 AM

## 2015-08-06 ENCOUNTER — Ambulatory Visit (HOSPITAL_BASED_OUTPATIENT_CLINIC_OR_DEPARTMENT_OTHER): Payer: BC Managed Care – PPO

## 2015-08-06 VITALS — BP 130/65 | HR 85 | Temp 98.6°F | Resp 18

## 2015-08-06 DIAGNOSIS — D509 Iron deficiency anemia, unspecified: Secondary | ICD-10-CM | POA: Diagnosis not present

## 2015-08-06 MED ORDER — METHYLPREDNISOLONE SODIUM SUCC 125 MG IJ SOLR
125.0000 mg | Freq: Every day | INTRAMUSCULAR | Status: DC
  Administered 2015-08-06: 125 mg via INTRAVENOUS

## 2015-08-06 MED ORDER — SODIUM CHLORIDE 0.9 % IV SOLN
510.0000 mg | Freq: Once | INTRAVENOUS | Status: AC
  Administered 2015-08-06: 510 mg via INTRAVENOUS
  Filled 2015-08-06: qty 17

## 2015-08-06 MED ORDER — METHYLPREDNISOLONE SODIUM SUCC 125 MG IJ SOLR
INTRAMUSCULAR | Status: AC
  Filled 2015-08-06: qty 2

## 2015-08-06 MED ORDER — SODIUM CHLORIDE 0.9 % IV SOLN
Freq: Once | INTRAVENOUS | Status: AC
  Administered 2015-08-06: 10:00:00 via INTRAVENOUS

## 2015-08-06 NOTE — Patient Instructions (Signed)

## 2015-08-28 ENCOUNTER — Other Ambulatory Visit: Payer: Self-pay | Admitting: Internal Medicine

## 2015-08-28 DIAGNOSIS — G43809 Other migraine, not intractable, without status migrainosus: Secondary | ICD-10-CM

## 2015-09-02 ENCOUNTER — Telehealth: Payer: Self-pay

## 2015-09-02 NOTE — Telephone Encounter (Signed)
Pt called to go over her appt schedule to clarify dates and times - done.

## 2015-09-03 ENCOUNTER — Other Ambulatory Visit: Payer: Self-pay | Admitting: Hematology and Oncology

## 2015-09-03 ENCOUNTER — Other Ambulatory Visit (HOSPITAL_BASED_OUTPATIENT_CLINIC_OR_DEPARTMENT_OTHER): Payer: BC Managed Care – PPO

## 2015-09-03 DIAGNOSIS — D509 Iron deficiency anemia, unspecified: Secondary | ICD-10-CM

## 2015-09-03 LAB — FERRITIN: Ferritin: 68 ng/ml (ref 9–269)

## 2015-09-03 LAB — CBC & DIFF AND RETIC
BASO%: 0.3 % (ref 0.0–2.0)
BASOS ABS: 0 10*3/uL (ref 0.0–0.1)
EOS%: 2.2 % (ref 0.0–7.0)
Eosinophils Absolute: 0.2 10*3/uL (ref 0.0–0.5)
HEMATOCRIT: 32 % — AB (ref 34.8–46.6)
HGB: 9.4 g/dL — ABNORMAL LOW (ref 11.6–15.9)
Immature Retic Fract: 6.2 % (ref 1.60–10.00)
LYMPH%: 34.9 % (ref 14.0–49.7)
MCH: 20.1 pg — ABNORMAL LOW (ref 25.1–34.0)
MCHC: 29.4 g/dL — AB (ref 31.5–36.0)
MCV: 68.4 fL — ABNORMAL LOW (ref 79.5–101.0)
MONO#: 0.5 10*3/uL (ref 0.1–0.9)
MONO%: 6.8 % (ref 0.0–14.0)
NEUT#: 4.1 10*3/uL (ref 1.5–6.5)
NEUT%: 55.8 % (ref 38.4–76.8)
PLATELETS: 274 10*3/uL (ref 145–400)
RBC: 4.68 10*6/uL (ref 3.70–5.45)
RETIC CT ABS: 73.48 10*3/uL (ref 33.70–90.70)
Retic %: 1.57 % (ref 0.70–2.10)
WBC: 7.3 10*3/uL (ref 3.9–10.3)
lymph#: 2.6 10*3/uL (ref 0.9–3.3)

## 2015-09-04 ENCOUNTER — Ambulatory Visit
Admission: RE | Admit: 2015-09-04 | Discharge: 2015-09-04 | Disposition: A | Discharge status: Home / Self Care | Payer: BC Managed Care – PPO | Source: Ambulatory Visit | Attending: Internal Medicine | Admitting: Internal Medicine

## 2015-09-04 DIAGNOSIS — G43809 Other migraine, not intractable, without status migrainosus: Secondary | ICD-10-CM

## 2015-09-09 ENCOUNTER — Encounter: Payer: Self-pay | Admitting: Podiatry

## 2015-09-09 ENCOUNTER — Ambulatory Visit (INDEPENDENT_AMBULATORY_CARE_PROVIDER_SITE_OTHER): Payer: BC Managed Care – PPO | Admitting: Podiatry

## 2015-09-09 ENCOUNTER — Ambulatory Visit (INDEPENDENT_AMBULATORY_CARE_PROVIDER_SITE_OTHER): Payer: BC Managed Care – PPO

## 2015-09-09 VITALS — BP 133/73 | HR 79 | Resp 16

## 2015-09-09 DIAGNOSIS — M722 Plantar fascial fibromatosis: Secondary | ICD-10-CM

## 2015-09-09 DIAGNOSIS — M7660 Achilles tendinitis, unspecified leg: Secondary | ICD-10-CM | POA: Diagnosis not present

## 2015-09-09 MED ORDER — METHYLPREDNISOLONE 4 MG PO TBPK: ORAL_TABLET | ORAL | 0 refills | Status: DC

## 2015-09-09 MED ORDER — MELOXICAM 15 MG PO TABS: 15.0000 mg | ORAL_TABLET | Freq: Every day | ORAL | 3 refills | Status: DC

## 2015-09-09 NOTE — Patient Instructions (Addendum)

## 2015-09-09 NOTE — Progress Notes (Signed)
   Subjective:    Patient ID: [name] [name], female    DOB: [birth year], 40 y.o.   MRN: [id number]  HPI: She presents today with a chief complaint of posterior heel pain 4 months. She denies any trauma but relates that the left is slightly worse than the right. She states that maybe came about from chasing a 21-year-old around the house. She states it seems to be worse at the end of the day but also painful but she first gets up in the morning. She's tried no treatment.    Review of Systems  All other systems reviewed and are negative.      Objective:   Physical Exam: Vital signs are stable alert and oriented 3. Pulses are palpable. Neurologic sensorium is intact. Deep tendon reflexes are intact muscle strength is normal. Orthopedic evaluation of a straight awl joints distal to the ankle for range of motion without crepitation. She has mild equinus with pes planus bilateral. She has tendo Achilles tendinitis with pain on palpation at the insertion site of the Achilles tendon left greater than right there appears to be some fluctuance with underneath the skin much like a bursitis to her left heel. Radiographs do demonstrate pes planus as well as a posterior proximally oriented calcaneal heel spur I see no thickening of the Achilles at this point. Cutaneous evaluation of Mr. supple well-hydrated cutis no erythema edema saline as drainage or odor.        Assessment & Plan:  Assessment: Achilles tendinitis bilateral.  Plan: Start her on a Medrol Dosepak to be followed by meloxicam. Placed her in bilateral night splints. We discussed appropriate shoe gear stretching exercises and ice therapy. I also provided her with stretching exercises. We discussed the possible need for physical therapy upon next visit as well as having orthotics made.

## 2015-09-10 ENCOUNTER — Ambulatory Visit (HOSPITAL_BASED_OUTPATIENT_CLINIC_OR_DEPARTMENT_OTHER): Payer: BC Managed Care – PPO

## 2015-09-10 ENCOUNTER — Telehealth: Payer: Self-pay | Admitting: Hematology and Oncology

## 2015-09-10 ENCOUNTER — Encounter: Payer: Self-pay | Admitting: Hematology and Oncology

## 2015-09-10 ENCOUNTER — Ambulatory Visit (HOSPITAL_BASED_OUTPATIENT_CLINIC_OR_DEPARTMENT_OTHER): Payer: BC Managed Care – PPO | Admitting: Hematology and Oncology

## 2015-09-10 VITALS — BP 132/68 | HR 79 | Temp 98.1°F | Resp 18 | Wt 260.4 lb

## 2015-09-10 VITALS — BP 136/65 | HR 75 | Temp 98.7°F | Resp 20

## 2015-09-10 DIAGNOSIS — D509 Iron deficiency anemia, unspecified: Secondary | ICD-10-CM

## 2015-09-10 MED ORDER — METHYLPREDNISOLONE SODIUM SUCC 125 MG IJ SOLR
INTRAMUSCULAR | Status: AC
  Filled 2015-09-10: qty 2

## 2015-09-10 MED ORDER — SODIUM CHLORIDE 0.9 % IV SOLN
Freq: Once | INTRAVENOUS | Status: AC
  Administered 2015-09-10: 10:00:00 via INTRAVENOUS

## 2015-09-10 MED ORDER — FERUMOXYTOL INJECTION 510 MG/17 ML
510.0000 mg | INTRAVENOUS | Status: DC
  Administered 2015-09-10: 510 mg via INTRAVENOUS
  Filled 2015-09-10: qty 17

## 2015-09-10 MED ORDER — METHYLPREDNISOLONE SODIUM SUCC 125 MG IJ SOLR
60.0000 mg | Freq: Every day | INTRAMUSCULAR | Status: DC
  Administered 2015-09-10: 60 mg via INTRAVENOUS

## 2015-09-10 NOTE — Assessment & Plan Note (Signed)
The most likely cause of her anemia is due to chronic blood loss/malabsorption syndrome. We discussed some of the risks, benefits, and alternatives of intravenous iron infusions. The patient is symptomatic from anemia and the iron level is critically low. She tolerated oral iron supplement poorly and desires to achieved higher levels of iron faster for adequate hematopoesis. Some of the side-effects to be expected including risks of infusion reactions, phlebitis, headaches, nausea and fatigue.  The patient is willing to proceed. Patient education material was dispensed.  Goal is to keep ferritin level greater than 50 Previously, she had mild reaction to Feraheme. With Solu-Medrol, she was able to tolerate her treatment. I will premedicate her with Solu-Medrol prior to each infusion. I also discussed with the possibility of offering her blood transfusion. We discussed risk and benefit of blood transfusion and she ultimately felt best to proceed with IV iron for now. I recommend she takes prenatal vitamin for folic acid and vitamin B-12 supplementation for effective erythropoiesis. I plan to recheck blood work next month

## 2015-09-10 NOTE — Telephone Encounter (Signed)
Gave pt cal & avs °

## 2015-09-10 NOTE — Patient Instructions (Signed)

## 2015-09-10 NOTE — Progress Notes (Signed)
[place], [place] SUMMARY OF HEMATOLOGIC HISTORY:  She was found to have abnormal CBC from recent blood work for evaluation of fatigue. She denies recent chest pain on exertion, pre-syncopal episodes, or palpitations. She does complain of leg cramps, dizziness, shortness of breath on minimal exertion, and frequent headaches. Recent CBC done last month in May 2015 shows significant anemia with hemoglobin 7.9, MCV of 55 and platelet count of 409. She received one dose of intravenous iron on 07/23/2013. After infusion, she complained of scratchy throat and developed hives. Subsequently, she received premedication with Solu-Medrol and she was able to complete her treatment in July 2015 She received further IV iron in June 2017 for recurrent iron deficiency anemia INTERVAL HISTORY: [name] [name] 40 y.o. female returns for further follow-up. We premedication, she tolerated IV iron without any allergic reaction. The patient denies any recent signs or symptoms of bleeding such as spontaneous epistaxis, hematuria or hematochezia. She continues to complain of excessive fatigue despite recent IV iron  I have reviewed the past medical history, past surgical history, social history and family history with the patient and they are unchanged from previous note.  ALLERGIES:  is allergic to feraheme [ferumoxytol].  MEDICATIONS:  Current Outpatient Prescriptions  Medication Sig Dispense Refill  . Cholecalciferol (VITAMIN D) 2000 UNITS tablet Take 2,000 Units by mouth daily.    . meloxicam (MOBIC) 15 MG tablet Take 1 tablet (15 mg total) by mouth daily. 30 tablet 3  . methylPREDNISolone (MEDROL) 4 MG TBPK tablet Tapering 6 day dose pack 21 tablet 0  . rizatriptan (MAXALT-MLT) 10 MG disintegrating tablet Take 10 mg by mouth as needed for migraine. May repeat in 2 hours if needed     No current facility-administered medications for this visit.       REVIEW OF SYSTEMS:   Constitutional: Denies fevers, chills or night sweats Eyes: Denies blurriness of vision Ears, nose, mouth, throat, and face: Denies mucositis or sore throat Respiratory: Denies cough, dyspnea or wheezes Cardiovascular: Denies palpitation, chest discomfort or lower extremity swelling Gastrointestinal:  Denies nausea, heartburn or change in bowel habits Skin: Denies abnormal skin rashes Lymphatics: Denies new lymphadenopathy or easy bruising Neurological:Denies numbness, tingling or new weaknesses Behavioral/Psych: Mood is stable, no new changes  All other systems were reviewed with the patient and are negative.  PHYSICAL EXAMINATION: ECOG PERFORMANCE STATUS: 0 - Asymptomatic  Vitals:   09/10/15 0851  BP: 132/68  Pulse: 79  Resp: 18  Temp: 98.1 F (36.7 C)   Filed Weights   09/10/15 0851  Weight: 260 lb 6.4 oz (118.1 kg)    GENERAL:alert, no distress and comfortable SKIN: skin color, texture, turgor are normal, no rashes or significant lesions EYES: normal, Conjunctiva are pink and non-injected, sclera clear Musculoskeletal:no cyanosis of digits and no clubbing  NEURO: alert & oriented x 3 with fluent speech, no focal motor/sensory deficits  LABORATORY DATA:  I have reviewed the data as listed     Component Value Date/Time   NA 137 02/28/2012 1607   K 4.2 02/28/2012 1607   CL 103 02/28/2012 1607   CO2 25 02/28/2012 1607   GLUCOSE 81 02/28/2012 1607   BUN 16 02/28/2012 1607   CREATININE 0.60 02/28/2012 1607   CALCIUM 9.3 02/28/2012 1607   PROT 7.4 02/28/2012 1607   ALBUMIN 4.2 02/28/2012 1607   AST 13 02/28/2012 1607   ALT 11 02/28/2012 1607   ALKPHOS 57 02/28/2012 1607  BILITOT 0.2 (L) 02/28/2012 1607    No results found for: SPEP, UPEP  Lab Results  Component Value Date   WBC 7.3 09/03/2015   NEUTROABS 4.1 09/03/2015   HGB 9.4 (L) 09/03/2015   HCT 32.0 (L) 09/03/2015   MCV 68.4 (L) 09/03/2015   PLT 274 09/03/2015       Chemistry      Component Value Date/Time   NA 137 02/28/2012 1607   K 4.2 02/28/2012 1607   CL 103 02/28/2012 1607   CO2 25 02/28/2012 1607   BUN 16 02/28/2012 1607   CREATININE 0.60 02/28/2012 1607      Component Value Date/Time   CALCIUM 9.3 02/28/2012 1607   ALKPHOS 57 02/28/2012 1607   AST 13 02/28/2012 1607   ALT 11 02/28/2012 1607   BILITOT 0.2 (L) 02/28/2012 1607       ASSESSMENT & PLAN:  Iron deficiency anemia, unspecified The most likely cause of her anemia is due to chronic blood loss/malabsorption syndrome. We discussed some of the risks, benefits, and alternatives of intravenous iron infusions. The patient is symptomatic from anemia and the iron level is critically low. She tolerated oral iron supplement poorly and desires to achieved higher levels of iron faster for adequate hematopoesis. Some of the side-effects to be expected including risks of infusion reactions, phlebitis, headaches, nausea and fatigue.  The patient is willing to proceed. Patient education material was dispensed.  Goal is to keep ferritin level greater than 50 Previously, she had mild reaction to Feraheme. With Solu-Medrol, she was able to tolerate her treatment. I will premedicate her with Solu-Medrol prior to each infusion. I also discussed with the possibility of offering her blood transfusion. We discussed risk and benefit of blood transfusion and she ultimately felt best to proceed with IV iron for now. I recommend she takes prenatal vitamin for folic acid and vitamin B-12 supplementation for effective erythropoiesis. I plan to recheck blood work next month  Microcytic anemia She has chronic microcytic anemia. I am wondering about possibility of diagnosis of thalassemia. I will check with hemoglobinopathy evaluation next month   All questions were answered. The patient knows to call the clinic with any problems, questions or concerns. No barriers to learning was detected.  I spent 10  minutes counseling the patient face to face. The total time spent in the appointment was 15 minutes and more than 50% was on counseling.     Alvy Bimler, Sherrick Araki, MD 8/2/20173:18 PM

## 2015-09-10 NOTE — Assessment & Plan Note (Signed)
She has chronic microcytic anemia. I am wondering about possibility of diagnosis of thalassemia. I will check with hemoglobinopathy evaluation next month

## 2015-09-17 ENCOUNTER — Ambulatory Visit (HOSPITAL_BASED_OUTPATIENT_CLINIC_OR_DEPARTMENT_OTHER): Payer: BC Managed Care – PPO

## 2015-09-17 VITALS — BP 121/47 | HR 67 | Temp 98.4°F | Resp 18

## 2015-09-17 DIAGNOSIS — D509 Iron deficiency anemia, unspecified: Secondary | ICD-10-CM

## 2015-09-17 MED ORDER — METHYLPREDNISOLONE SODIUM SUCC 125 MG IJ SOLR
INTRAMUSCULAR | Status: AC
  Filled 2015-09-17: qty 2

## 2015-09-17 MED ORDER — SODIUM CHLORIDE 0.9 % IV SOLN
510.0000 mg | INTRAVENOUS | Status: DC
  Administered 2015-09-17: 510 mg via INTRAVENOUS
  Filled 2015-09-17: qty 17

## 2015-09-17 MED ORDER — METHYLPREDNISOLONE SODIUM SUCC 125 MG IJ SOLR
60.0000 mg | Freq: Every day | INTRAMUSCULAR | Status: DC
  Administered 2015-09-17: 60 mg via INTRAVENOUS

## 2015-09-17 MED ORDER — SODIUM CHLORIDE 0.9 % IV SOLN
Freq: Once | INTRAVENOUS | Status: AC
  Administered 2015-09-17: 09:00:00 via INTRAVENOUS

## 2015-09-17 NOTE — Patient Instructions (Signed)

## 2015-10-07 ENCOUNTER — Ambulatory Visit: Payer: BC Managed Care – PPO | Admitting: Podiatry

## 2015-11-03 ENCOUNTER — Other Ambulatory Visit: Payer: Self-pay | Admitting: Hematology and Oncology

## 2015-11-05 ENCOUNTER — Other Ambulatory Visit (HOSPITAL_BASED_OUTPATIENT_CLINIC_OR_DEPARTMENT_OTHER): Payer: BC Managed Care – PPO

## 2015-11-05 ENCOUNTER — Telehealth: Payer: Self-pay | Admitting: *Deleted

## 2015-11-05 DIAGNOSIS — D509 Iron deficiency anemia, unspecified: Secondary | ICD-10-CM

## 2015-11-05 LAB — CBC & DIFF AND RETIC
BASO%: 0.5 % (ref 0.0–2.0)
BASOS ABS: 0 10*3/uL (ref 0.0–0.1)
EOS ABS: 0.2 10*3/uL (ref 0.0–0.5)
EOS%: 2.5 % (ref 0.0–7.0)
HCT: 36.1 % (ref 34.8–46.6)
HGB: 11.2 g/dL — ABNORMAL LOW (ref 11.6–15.9)
IMMATURE RETIC FRACT: 13.2 % — AB (ref 1.60–10.00)
LYMPH%: 37.7 % (ref 14.0–49.7)
MCH: 23.1 pg — ABNORMAL LOW (ref 25.1–34.0)
MCHC: 31 g/dL — AB (ref 31.5–36.0)
MCV: 74.6 fL — ABNORMAL LOW (ref 79.5–101.0)
MONO#: 0.3 10*3/uL (ref 0.1–0.9)
MONO%: 5.4 % (ref 0.0–14.0)
NEUT%: 53.9 % (ref 38.4–76.8)
NEUTROS ABS: 3.2 10*3/uL (ref 1.5–6.5)
PLATELETS: 291 10*3/uL (ref 145–400)
RBC: 4.84 10*6/uL (ref 3.70–5.45)
RDW: 18.7 % — ABNORMAL HIGH (ref 11.2–14.5)
RETIC CT ABS: 103.09 10*3/uL — AB (ref 33.70–90.70)
Retic %: 2.13 % — ABNORMAL HIGH (ref 0.70–2.10)
WBC: 5.9 10*3/uL (ref 3.9–10.3)
lymph#: 2.2 10*3/uL (ref 0.9–3.3)

## 2015-11-05 LAB — FERRITIN: Ferritin: 110 ng/ml (ref 9–269)

## 2015-11-05 NOTE — Telephone Encounter (Signed)
-----   Message from [name], MD sent at 11/05/2015 10:52 AM EDT ----- Regarding: labs She is only mildly anemic. Ferritin over 100. No need IV iron ----- Message ----- From: Interface, Lab In Three Zero One Sent: 11/05/2015   8:40 AM To: [name], MD

## 2015-11-05 NOTE — Telephone Encounter (Signed)
LVM for pt informing of CBC and Ferritin levels good. No need for IV iron now per Dr. Alvy Bimler.  Please call back if any questions.

## 2015-11-06 LAB — HEMOGLOBINOPATHY EVALUATION
HEMOGLOBIN A2 QUANTITATION: 2.2 % (ref 0.7–3.1)
HGB A: 97.8 % (ref 94.0–98.0)
HGB C: 0 %
HGB S: 0 %
Hemoglobin F Quantitation: 0 % (ref 0.0–2.0)

## 2015-11-17 ENCOUNTER — Ambulatory Visit: Payer: BC Managed Care – PPO | Admitting: Neurology

## 2016-01-28 ENCOUNTER — Encounter: Payer: Self-pay | Admitting: Neurology

## 2016-01-28 ENCOUNTER — Ambulatory Visit (INDEPENDENT_AMBULATORY_CARE_PROVIDER_SITE_OTHER): Payer: BC Managed Care – PPO | Admitting: Neurology

## 2016-01-28 VITALS — BP 110/80 | HR 89 | Ht 64.0 in | Wt 272.1 lb

## 2016-01-28 DIAGNOSIS — G43009 Migraine without aura, not intractable, without status migrainosus: Secondary | ICD-10-CM | POA: Diagnosis not present

## 2016-01-28 DIAGNOSIS — G5603 Carpal tunnel syndrome, bilateral upper limbs: Secondary | ICD-10-CM

## 2016-01-28 MED ORDER — SUMATRIPTAN SUCCINATE 100 MG PO TABS: ORAL_TABLET | ORAL | 2 refills | Status: DC

## 2016-01-28 MED ORDER — NAPROXEN 500 MG PO TABS: ORAL_TABLET | ORAL | 2 refills | Status: DC

## 2016-01-28 NOTE — Patient Instructions (Signed)
Migraine Recommendations: 1.  Since your migraines aren't frequent, we do not need to start a daily medication 2.  Take sumatriptan 100mg  with naproxen 500mg  at earliest onset of headache.  May repeat dose once after 2 hours if needed.  Do not exceed two doses in 24 hours. 3.  Limit use of pain relievers to no more than 2 days out of the week.  These medications include acetaminophen, ibuprofen, triptans and narcotics.  This will help reduce risk of rebound headaches. 4.  Be aware of common food triggers such as processed sweets, processed foods with nitrites (such as deli meat, hot dogs, sausages), foods with MSG, alcohol (such as wine), chocolate, certain cheeses, certain fruits (dried fruits, some citrus fruit), vinegar, diet soda. 4.  Avoid caffeine 5.  Routine exercise 6.  Proper sleep hygiene 7.  Stay adequately hydrated with water 8.  Keep a headache diary. 9.  Maintain proper stress management. 10.  Do not skip meals. 11.  Consider supplements:  Magnesium citrate 400mg  to 600mg  daily, riboflavin 400mg , Coenzyme Q 10 100mg  three times daily 12.  We will set you up for nerve conduction study to evaluate for carpal tunnel in both hands. 13.  Follow up in 4 months but contact me if the sumatriptan and naproxen are ineffective.

## 2016-01-28 NOTE — Progress Notes (Signed)
NEUROLOGY CONSULTATION NOTE  [name] [name] MRN: [id number] DOB: 03-19-1975  Referring provider: Dr. Coralyn Mark Primary care provider: Dr. Coralyn Mark  Reason for consult:  migraine  HISTORY OF PRESENT ILLNESS: [name] [name] is a 40 year old right-handed female who presents for migraines and bilateral hand/arm numbness.  History supplemented by PCP note.  Onset:  1998-99 Location:  Bilateral retro-orbital, ears Quality:  Pounding, pressure in ears Intensity:  8-9/10 Aura:  No Prodrome:  No Associated symptoms:  Nausea, vomiting, photophobia, osmophobia Duration:  8 hours to 2 days (usually 1.5 days) Frequency: 2 to 3 times a month Triggers:  No Relieving factors:  Sleep Activity:  Lays down  Past NSAIDs: Mobic Past analgesics: Tylenol, Excedrin Past triptans:  Maxalt Past antihypertensive medication:  No Past antidepressant: No Past anticonvulsant:  No Past vitamins:  D Other past therapy:  Cupping, massage, acupuncture  Current NSAIDs: ibuprofen Current analgesics: no Current triptan:  Sumatriptan 25mg ? Current antihypertensive:  No Current antidepressant:  No Current anticonvulsant:  No  Caffeine: tea Alcohol: no Smoker: no Diet: hydrates.  No soda Exercise: not routine Sleep:  6 hours Family history of headache: no  For several years, she reports numbness and tingling in the first 3 digits of both hands, radiating up the forearm, worse in the right hand.  It occurs constantly but most noticeable when holding something, driving or in bed.  She denies neck pain or radicular pain down the arms.  She sometimes has dropped her phone but no noticeable weakness.  Wrist splints were ineffective.  PAST MEDICAL HISTORY: Past Medical History:  Diagnosis Date  . Anemia   . Headache(784.0)   . PCOS (polycystic ovarian syndrome)     PAST SURGICAL HISTORY: Past Surgical History:  Procedure Laterality Date  . BUNIONECTOMY    . BUNIONECTOMY  05/22/11   right    . LAPAROSCOPIC OVARIAN CYSTECTOMY    . LYMPH GLAND EXCISION      MEDICATIONS: Current Outpatient Prescriptions on File Prior to Visit  Medication Sig Dispense Refill  . Cholecalciferol (VITAMIN D) 2000 UNITS tablet Take 2,000 Units by mouth daily.    . meloxicam (MOBIC) 15 MG tablet Take 1 tablet (15 mg total) by mouth daily. (Patient not taking: Reported on 01/28/2016) 30 tablet 3  . methylPREDNISolone (MEDROL) 4 MG TBPK tablet Tapering 6 day dose pack (Patient not taking: Reported on 01/28/2016) 21 tablet 0  . rizatriptan (MAXALT-MLT) 10 MG disintegrating tablet Take 10 mg by mouth as needed for migraine. May repeat in 2 hours if needed     No current facility-administered medications on file prior to visit.     ALLERGIES: Allergies  Allergen Reactions  . Feraheme [Ferumoxytol] Hives    FAMILY HISTORY: No family history of headache  SOCIAL HISTORY: Social History   Social History  . Marital status: Married    Spouse name: N/A  . Number of children: N/A  . Years of education: N/A   Occupational History  . Not on file.   Social History Main Topics  . Smoking status: Never Smoker  . Smokeless tobacco: Never Used  . Alcohol use Yes     Comment: OCASSIONALLY  . Drug use: No  . Sexual activity: Yes   Other Topics Concern  . Not on file   Social History Narrative  . No narrative on file    REVIEW OF SYSTEMS: Constitutional: No fevers, chills, or sweats, no generalized fatigue, change in appetite Eyes: No visual changes, double vision, eye pain  Ear, nose and throat: No hearing loss, ear pain, nasal congestion, sore throat Cardiovascular: No chest pain, palpitations Respiratory:  No shortness of breath at rest or with exertion, wheezes GastrointestinaI: No nausea, vomiting, diarrhea, abdominal pain, fecal incontinence Genitourinary:  No dysuria, urinary retention or frequency Musculoskeletal:  No neck pain, back pain Integumentary: No rash, pruritus, skin  lesions Neurological: as above Psychiatric: No depression, insomnia, anxiety Endocrine: No palpitations, fatigue, diaphoresis, mood swings, change in appetite, change in weight, increased thirst Hematologic/Lymphatic:  No purpura, petechiae. Allergic/Immunologic: no itchy/runny eyes, nasal congestion, recent allergic reactions, rashes  PHYSICAL EXAM: Vitals:   01/28/16 1501  BP: 110/80  Pulse: 89  BMI:    46.71 kg/m2 General: No acute distress.  Patient appears well-groomed.   Head:  Normocephalic/atraumatic Eyes:  fundi examined but not visualized Neck: supple, no paraspinal tenderness, full range of motion Back: No paraspinal tenderness Heart: regular rate and rhythm Lungs: Clear to auscultation bilaterally. Vascular: No carotid bruits. Neurological Exam: Mental status: alert and oriented to person, place, and time, recent and remote memory intact, fund of knowledge intact, attention and concentration intact, speech fluent and not dysarthric, language intact. Cranial nerves: CN I: not tested CN II: pupils equal, round and reactive to light, visual fields intact CN III, IV, VI:  full range of motion, no nystagmus, no ptosis CN V: facial sensation intact CN VII: upper and lower face symmetric CN VIII: hearing intact CN IX, X: gag intact, uvula midline CN XI: sternocleidomastoid and trapezius muscles intact CN XII: tongue midline Bulk & Tone: normal, no fasciculations. Motor:  5/5 throughout  Sensation:  Decreased light touch sensation to the first two digits of the right hand.  Positive Phalen's test. Temperature and vibration sensation intact.  Deep Tendon Reflexes:  2+ throughout, toes downgoing.  Finger to nose testing:  Without dysmetria.   Heel to shin:  Without dysmetria.   Gait:  Normal station and stride.  Able to turn and tandem walk. Romberg   IMPRESSION: Migraine without aura Carpal tunnel syndrome Morbid obesity  PLAN: 1.  Migraines aren't frequent, so I  don't think we need to start a preventative medication:  Recommend lifestyle modification:  Diet, exercise, discussed vitamins/supplements 2.  Increase sumatriptan to 100mg  and instructed to take with naproxen 500mg  3.  Will set her up for NCV-EMG of upper extremities to evaluate for carpal tunnel syndrome 4.  Follow up in 4 months.  Contact me sooner if regimen is ineffective.  Thank you for allowing me to take part in the care of this patient.  Metta Clines, DO  CC: Emi Belfast, MD

## 2016-02-10 ENCOUNTER — Ambulatory Visit (INDEPENDENT_AMBULATORY_CARE_PROVIDER_SITE_OTHER): Payer: BC Managed Care – PPO | Admitting: Neurology

## 2016-02-10 DIAGNOSIS — G5603 Carpal tunnel syndrome, bilateral upper limbs: Secondary | ICD-10-CM

## 2016-02-10 NOTE — Procedures (Signed)
[facility]  [place], [address]  [place], [place] [address] Tel: [phone] Fax:  [phone] Test Date:  02/10/2016  Patient: [name] [name] DOB: 21-Jul-1975 Physician: Narda Amber, DO  Sex: Female Height: 5\' 4"  Ref Phys: Metta Clines, D.O.  ID#: [id number] Temp: 33.4C Technician: Jerilynn Mages. Dean   Patient Complaints: This is a 41 year old female referred for evaluation of bilateral hand paresthesias.   NCV & EMG Findings: Extensive electrodiagnostic testing of the right upper extremity and additional studies of the left shows:  1. Bilateral median sensory responses are absent. Bilateral ulnar sensory responses are within normal limits.  2. Bilateral median motor responses show prolonged latency (R6.6, L6.1 ms) and normal amplitude. Bilateral ulnar motor responses are within normal limits. 3. There is no evidence of active or chronic motor axon loss changes affecting any of the tested muscles. Motor unit configuration and recruitment pattern is within normal limits.  Impression: Bilateral median neuropathy at or distal to the wrist, consistent with the clinical diagnosis of carpal tunnel syndrome. Overall, these findings are severe in degree electrically and worse on the right.   ___________________________ Narda Amber, DO    Nerve Conduction Studies Anti Sensory Summary Table   Site NR Peak (ms) Norm Peak (ms) P-T Amp (V) Norm P-T Amp  Left Median Anti Sensory (2nd Digit)  33.4C  Wrist NR  <3.4  >20  Right Median Anti Sensory (2nd Digit)  33.4C  Wrist NR  <3.4  >20  Left Ulnar Anti Sensory (5th Digit)  33.4C  Wrist    2.8 <3.1 14.7 >12  Right Ulnar Anti Sensory (5th Digit)  33.4C  Wrist    2.8 <3.1 17.4 >12   Motor Summary Table   Site NR Onset (ms) Norm Onset (ms) O-P Amp (mV) Norm O-P Amp Site1 Site2 Delta-0 (ms) Dist (cm) Vel (m/s) Norm Vel (m/s)  Left Median Motor (Abd Poll Brev)  33.4C  Wrist    6.1 <3.9 10.9 >6 Elbow Wrist 4.4 23.0 52 >50  Elbow     10.5  10.5         Right Median Motor (Abd Poll Brev)  33.4C  Wrist    6.6 <3.9 7.1 >6 Elbow Wrist 4.7 24.0 51 >50  Elbow    11.3  6.7         Left Ulnar Motor (Abd Dig Minimi)  33.4C  Wrist    2.3 <3.1 7.5 >7 B Elbow Wrist 3.8 20.0 53 >50  B Elbow    6.1  7.3  A Elbow B Elbow 1.8 10.0 56 >50  A Elbow    7.9  7.4         Right Ulnar Motor (Abd Dig Minimi)  33.4C  Wrist    2.3 <3.1 7.8 >7 B Elbow Wrist 4.3 24.0 56 >50  B Elbow    6.6  7.7  A Elbow B Elbow 1.5 10.0 67 >50  A Elbow    8.1  7.5          EMG   Side Muscle Ins Act Fibs Psw Fasc Number Recrt Dur Dur. Amp Amp. Poly Poly. Comment  Right 1stDorInt Nml Nml Nml Nml Nml Nml Nml Nml Nml Nml Nml Nml N/A  Right Abd Poll Brev Nml Nml Nml Nml Nml Nml Nml Nml Nml Nml Nml Nml N/A  Right Ext Indicis Nml Nml Nml Nml Nml Nml Nml Nml Nml Nml Nml Nml N/A  Right PronatorTeres Nml Nml Nml Nml Nml Nml Nml  Nml Nml Nml Nml Nml N/A  Right Biceps Nml Nml Nml Nml Nml Nml Nml Nml Nml Nml Nml Nml N/A  Right Triceps Nml Nml Nml Nml Nml Nml Nml Nml Nml Nml Nml Nml N/A  Right Deltoid Nml Nml Nml Nml Nml Nml Nml Nml Nml Nml Nml Nml N/A  Left Abd Poll Brev Nml Nml Nml Nml Nml Nml Nml Nml Nml Nml Nml Nml N/A  Left PronatorTeres Nml Nml Nml Nml Nml Nml Nml Nml Nml Nml Nml Nml N/A      Waveforms:

## 2016-02-11 ENCOUNTER — Telehealth: Payer: Self-pay

## 2016-02-11 DIAGNOSIS — G5600 Carpal tunnel syndrome, unspecified upper limb: Secondary | ICD-10-CM

## 2016-02-11 NOTE — Telephone Encounter (Signed)
Called patient to give test  results. No answer.  Will try later.

## 2016-02-11 NOTE — Telephone Encounter (Signed)
-----   Message from Pieter Partridge, DO sent at 02/11/2016  9:32 AM EST ----- The nerve study does reveal bilateral carpal tunnel syndrome and appears severe.  Since it appears severe on testing, and she reports dropping things (such as her phone), I feel she would benefit from surgery.  I would like to refer her.

## 2016-02-12 NOTE — Telephone Encounter (Signed)
Spoke to patient. Gave results and referral process info. Patient agreed and verbalized understanding.

## 2016-02-12 NOTE — Telephone Encounter (Signed)
Called patient. No answer.  Faxed referral to [place].

## 2016-02-19 ENCOUNTER — Telehealth: Payer: Self-pay

## 2016-02-19 NOTE — Telephone Encounter (Signed)
Referral appt:  [facility] appt on : 03/26/2016 w/ Avelina Laine, PA @ 8:30a.m.

## 2016-04-13 ENCOUNTER — Other Ambulatory Visit: Payer: Self-pay | Admitting: Obstetrics and Gynecology

## 2017-01-04 ENCOUNTER — Telehealth: Payer: Self-pay

## 2017-01-04 ENCOUNTER — Other Ambulatory Visit: Payer: Self-pay | Admitting: Hematology and Oncology

## 2017-01-04 ENCOUNTER — Other Ambulatory Visit: Payer: Self-pay | Admitting: Internal Medicine

## 2017-01-04 DIAGNOSIS — R110 Nausea: Secondary | ICD-10-CM

## 2017-01-04 DIAGNOSIS — D500 Iron deficiency anemia secondary to blood loss (chronic): Secondary | ICD-10-CM

## 2017-01-04 DIAGNOSIS — R109 Unspecified abdominal pain: Secondary | ICD-10-CM

## 2017-01-04 NOTE — Telephone Encounter (Signed)
Called and given appt info for Friday at 10 am at patient care management. Scheduling message sent for appt for labs tomorrow at [place], with appt with Dr. Alvy Bimler after. Verbalized understanding.

## 2017-01-05 ENCOUNTER — Telehealth: Payer: Self-pay | Admitting: Hematology and Oncology

## 2017-01-05 ENCOUNTER — Ambulatory Visit (HOSPITAL_BASED_OUTPATIENT_CLINIC_OR_DEPARTMENT_OTHER): Payer: BC Managed Care – PPO | Admitting: Hematology and Oncology

## 2017-01-05 ENCOUNTER — Other Ambulatory Visit (HOSPITAL_BASED_OUTPATIENT_CLINIC_OR_DEPARTMENT_OTHER): Payer: BC Managed Care – PPO

## 2017-01-05 DIAGNOSIS — D500 Iron deficiency anemia secondary to blood loss (chronic): Secondary | ICD-10-CM

## 2017-01-05 DIAGNOSIS — D509 Iron deficiency anemia, unspecified: Secondary | ICD-10-CM

## 2017-01-05 LAB — CBC & DIFF AND RETIC
BASO%: 0.5 % (ref 0.0–2.0)
Basophils Absolute: 0 10*3/uL (ref 0.0–0.1)
EOS ABS: 0.2 10*3/uL (ref 0.0–0.5)
EOS%: 2.5 % (ref 0.0–7.0)
HEMATOCRIT: 27.8 % — AB (ref 34.8–46.6)
HEMOGLOBIN: 7.6 g/dL — AB (ref 11.6–15.9)
Immature Retic Fract: 22.1 % — ABNORMAL HIGH (ref 1.60–10.00)
LYMPH%: 30 % (ref 14.0–49.7)
MCH: 14.8 pg — AB (ref 25.1–34.0)
MCHC: 27.4 g/dL — AB (ref 31.5–36.0)
MCV: 53.9 fL — AB (ref 79.5–101.0)
MONO#: 0.7 10*3/uL (ref 0.1–0.9)
MONO%: 8.2 % (ref 0.0–14.0)
NEUT#: 5.2 10*3/uL (ref 1.5–6.5)
NEUT%: 58.8 % (ref 38.4–76.8)
Platelets: 397 10*3/uL (ref 145–400)
RBC: 5.15 10*6/uL (ref 3.70–5.45)
RDW: 22.6 % — AB (ref 11.2–14.5)
RETIC %: 1.84 % (ref 0.70–2.10)
Retic Ct Abs: 94.76 10*3/uL — ABNORMAL HIGH (ref 33.70–90.70)
WBC: 8.9 10*3/uL (ref 3.9–10.3)
lymph#: 2.7 10*3/uL (ref 0.9–3.3)

## 2017-01-05 LAB — COMPREHENSIVE METABOLIC PANEL
ALBUMIN: 3.7 g/dL (ref 3.5–5.0)
ALT: 11 U/L (ref 0–55)
AST: 12 U/L (ref 5–34)
Alkaline Phosphatase: 56 U/L (ref 40–150)
Anion Gap: 8 mEq/L (ref 3–11)
BUN: 12.8 mg/dL (ref 7.0–26.0)
CALCIUM: 9.6 mg/dL (ref 8.4–10.4)
CO2: 24 mEq/L (ref 22–29)
CREATININE: 0.8 mg/dL (ref 0.6–1.1)
Chloride: 107 mEq/L (ref 98–109)
EGFR: >60 mL/min/{1.73_m2} (ref >60)
GLUCOSE: 90 mg/dL (ref 70–140)
POTASSIUM: 4.2 meq/L (ref 3.5–5.1)
SODIUM: 139 meq/L (ref 136–145)
Total Bilirubin: 0.29 mg/dL (ref 0.20–1.20)
Total Protein: 8 g/dL (ref 6.4–8.3)

## 2017-01-05 LAB — TECHNOLOGIST REVIEW

## 2017-01-05 NOTE — Telephone Encounter (Signed)
Scheduled appt per 11/28 los- Gave patient AVS and calender per los.  

## 2017-01-07 ENCOUNTER — Encounter: Payer: Self-pay | Admitting: Hematology and Oncology

## 2017-01-07 ENCOUNTER — Ambulatory Visit (HOSPITAL_COMMUNITY)
Admission: RE | Admit: 2017-01-07 | Discharge: 2017-01-07 | Disposition: A | Discharge status: Home / Self Care | Payer: BC Managed Care – PPO | Source: Ambulatory Visit | Attending: Hematology and Oncology | Admitting: Hematology and Oncology

## 2017-01-07 ENCOUNTER — Other Ambulatory Visit: Payer: Self-pay | Admitting: Hematology and Oncology

## 2017-01-07 ENCOUNTER — Telehealth: Payer: Self-pay

## 2017-01-07 DIAGNOSIS — D500 Iron deficiency anemia secondary to blood loss (chronic): Secondary | ICD-10-CM | POA: Insufficient documentation

## 2017-01-07 MED ORDER — METHYLPREDNISOLONE SODIUM SUCC 125 MG IJ SOLR
60.0000 mg | Freq: Once | INTRAMUSCULAR | Status: AC
  Administered 2017-01-07: 60 mg via INTRAVENOUS
  Filled 2017-01-07: qty 2

## 2017-01-07 MED ORDER — SODIUM CHLORIDE 0.9 % IV SOLN
510.0000 mg | Freq: Once | INTRAVENOUS | Status: AC
  Administered 2017-01-07: 510 mg via INTRAVENOUS
  Filled 2017-01-07: qty 17

## 2017-01-07 NOTE — Telephone Encounter (Signed)
-----   Message from [name], MD sent at 01/07/2017  4:10 PM EST ----- Regarding: non-urgent She needs 2nd dose IV iron at [facility] next week Can you call to set that up? End of next week is fine

## 2017-01-07 NOTE — Telephone Encounter (Signed)
Called with below message. Appt next Friday 12/7 at 10 am. Patient called and given above appt.

## 2017-01-07 NOTE — Assessment & Plan Note (Signed)
The patient was lost to follow-up since I saw her last Previously, when I saw her, her workup was incomplete Her primary care doctor has referred her to see GI service for endoscopy evaluation to rule out GI source of bleeding, especially her preponderance for NSAID ingestion I have reminded her to stop NSAID due to risk of GI bleed We discussed the risks, benefits, of intravenous iron infusion versus blood transfusion and ultimately the patient declined blood transfusion. The most likely cause of her anemia is due to chronic blood loss/malabsorption syndrome. We discussed some of the risks, benefits, and alternatives of intravenous iron infusions. The patient is symptomatic from anemia and the iron level is critically low. She tolerated oral iron supplement poorly and desires to achieved higher levels of iron faster for adequate hematopoesis. Some of the side-effects to be expected including risks of infusion reactions, phlebitis, headaches, nausea and fatigue.  The patient is willing to proceed. Patient education material was dispensed.  Goal is to keep ferritin level greater than 50

## 2017-01-07 NOTE — Progress Notes (Signed)
Provider: Alvy Bimler   Procedure: IV Feraheme Infusion   Treatment: Patient received IV Feraheme Infusion. Patient tolerated procedure well with no transfusion reaction. Discharge instructions given to patient and patient states an understanding. Patient alert, oriented, and ambulatory at time of discharge.

## 2017-01-07 NOTE — Progress Notes (Signed)
[name] OFF[facility] PROGRESS NOTE  [name] [name], [name] Cabal, MD SUMMARY OF HEMATOLOGIC HISTORY:  She was found to have abnormal CBC from recent blood work for evaluation of fatigue. She denies recent chest pain on exertion, pre-syncopal episodes, or palpitations. She does complain of leg cramps, dizziness, shortness of breath on minimal exertion, and frequent headaches. Recent CBC done last month in May 2015 shows significant anemia with hemoglobin 7.9, MCV of 55 and platelet count of 409. She received one dose of intravenous iron on 07/23/2013. After infusion, she complained of scratchy throat and developed hives. Subsequently, she received premedication with Solu-Medrol and she was able to complete her treatment in July 2015 She received further IV iron in June 2017 for recurrent iron deficiency anemia INTERVAL HISTORY: [name] [name] 41 y.o. female returns for urgent follow-up She was evaluated by her primary care doctor recently and was found to have recurrent severe iron deficiency anemia The patient have chronic intermittent headaches She takes NSAID for this She denies menorrhagia The patient denies any recent signs or symptoms of bleeding such as spontaneous epistaxis, hematuria or hematochezia. She complained of excessive chewing of ice/PICA She could not tolerate oral iron supplements in the past She had infusion reaction to Feraheme in the past.  With proper premedications, she was able to tolerate Feraheme  I have reviewed the past medical history, past surgical history, social history and family history with the patient and they are unchanged from previous note.  ALLERGIES:  is allergic to feraheme [ferumoxytol].  MEDICATIONS:  Current Outpatient Medications  Medication Sig Dispense Refill  . Cholecalciferol (VITAMIN D) 2000 UNITS tablet Take 2,000 Units by mouth daily.    . rizatriptan (MAXALT-MLT) 10 MG disintegrating tablet Take 10 mg by mouth as needed for  migraine. May repeat in 2 hours if needed    . SUMAtriptan (IMITREX) 100 MG tablet Take 1 tablet at earliest onset of headache.  May repeat once in 2 hours if headache persists or recurs. 10 tablet 2   No current facility-administered medications for this visit.      REVIEW OF SYSTEMS:   Constitutional: Denies fevers, chills or night sweats Eyes: Denies blurriness of vision Ears, nose, mouth, throat, and face: Denies mucositis or sore throat Respiratory: Denies cough, dyspnea or wheezes Cardiovascular: Denies palpitation, chest discomfort or lower extremity swelling Gastrointestinal:  Denies nausea, heartburn or change in bowel habits Skin: Denies abnormal skin rashes Lymphatics: Denies new lymphadenopathy or easy bruising Neurological:Denies numbness, tingling or new weaknesses Behavioral/Psych: Mood is stable, no new changes  All other systems were reviewed with the patient and are negative.  PHYSICAL EXAMINATION: ECOG PERFORMANCE STATUS: 1 - Symptomatic but completely ambulatory  Vitals:   01/05/17 0939  BP: (!) 144/70  Pulse: 89  Resp: 18  Temp: 99.3 F (37.4 C)  SpO2: 100%   Filed Weights   01/05/17 0939  Weight: 258 lb 4.8 oz (117.2 kg)    GENERAL:alert, no distress and comfortable SKIN: skin color, texture, turgor are normal, no rashes or significant lesions EYES: normal, Conjunctiva are pale and non-injected, sclera clear OROPHARYNX:no exudate, no erythema and lips, buccal mucosa, and tongue normal  NECK: supple, thyroid normal size, non-tender, without nodularity LYMPH:  no palpable lymphadenopathy in the cervical, axillary or inguinal LUNGS: clear to auscultation and percussion with normal breathing effort HEART: regular rate & rhythm and no murmurs and no lower extremity edema ABDOMEN:abdomen soft, non-tender and normal bowel sounds Musculoskeletal:no cyanosis of digits and no clubbing  NEURO: alert & oriented x 3 with fluent speech, no focal motor/sensory  deficits  LABORATORY DATA:  I have reviewed the data as listed     Component Value Date/Time   NA 139 01/05/2017 0920   K 4.2 01/05/2017 0920   CL 103 02/28/2012 1607   CO2 24 01/05/2017 0920   GLUCOSE 90 01/05/2017 0920   BUN 12.8 01/05/2017 0920   CREATININE 0.8 01/05/2017 0920   CALCIUM 9.6 01/05/2017 0920   PROT 8.0 01/05/2017 0920   ALBUMIN 3.7 01/05/2017 0920   AST 12 01/05/2017 0920   ALT 11 01/05/2017 0920   ALKPHOS 56 01/05/2017 0920   BILITOT 0.29 01/05/2017 0920    No results found for: SPEP, UPEP  Lab Results  Component Value Date   WBC 8.9 01/05/2017   NEUTROABS 5.2 01/05/2017   HGB 7.6 (L) 01/05/2017   HCT 27.8 (L) 01/05/2017   MCV 53.9 (L) 01/05/2017   PLT 397 01/05/2017      Chemistry      Component Value Date/Time   NA 139 01/05/2017 0920   K 4.2 01/05/2017 0920   CL 103 02/28/2012 1607   CO2 24 01/05/2017 0920   BUN 12.8 01/05/2017 0920   CREATININE 0.8 01/05/2017 0920      Component Value Date/Time   CALCIUM 9.6 01/05/2017 0920   ALKPHOS 56 01/05/2017 0920   AST 12 01/05/2017 0920   ALT 11 01/05/2017 0920   BILITOT 0.29 01/05/2017 0920      ASSESSMENT & PLAN:  Iron deficiency anemia The patient was lost to follow-up since I saw her last Previously, when I saw her, her workup was incomplete Her primary care doctor has referred her to see GI service for endoscopy evaluation to rule out GI source of bleeding, especially her preponderance for NSAID ingestion I have reminded her to stop NSAID due to risk of GI bleed We discussed the risks, benefits, of intravenous iron infusion versus blood transfusion and ultimately the patient declined blood transfusion. The most likely cause of her anemia is due to chronic blood loss/malabsorption syndrome. We discussed some of the risks, benefits, and alternatives of intravenous iron infusions. The patient is symptomatic from anemia and the iron level is critically low. She tolerated oral iron supplement  poorly and desires to achieved higher levels of iron faster for adequate hematopoesis. Some of the side-effects to be expected including risks of infusion reactions, phlebitis, headaches, nausea and fatigue.  The patient is willing to proceed. Patient education material was dispensed.  Goal is to keep ferritin level greater than 50    No orders of the defined types were placed in this encounter.   All questions were answered. The patient knows to call the clinic with any problems, questions or concerns. No barriers to learning was detected.  I spent 15 minutes counseling the patient face to face. The total time spent in the appointment was 20 minutes and more than 50% was on counseling.     [name], MD 11/30/20184:09 PM

## 2017-01-07 NOTE — Discharge Instructions (Signed)

## 2017-01-10 ENCOUNTER — Other Ambulatory Visit: Payer: BC Managed Care – PPO

## 2017-01-14 ENCOUNTER — Ambulatory Visit (HOSPITAL_COMMUNITY)
Admission: RE | Admit: 2017-01-14 | Discharge: 2017-01-14 | Disposition: A | Discharge status: Home / Self Care | Payer: BC Managed Care – PPO | Source: Ambulatory Visit | Attending: Hematology and Oncology | Admitting: Hematology and Oncology

## 2017-01-14 DIAGNOSIS — D500 Iron deficiency anemia secondary to blood loss (chronic): Secondary | ICD-10-CM | POA: Insufficient documentation

## 2017-01-14 MED ORDER — METHYLPREDNISOLONE SODIUM SUCC 125 MG IJ SOLR
60.0000 mg | Freq: Once | INTRAMUSCULAR | Status: AC
  Administered 2017-01-14: 60 mg via INTRAVENOUS
  Filled 2017-01-14: qty 2

## 2017-01-14 MED ORDER — SODIUM CHLORIDE 0.9 % IV SOLN
510.0000 mg | Freq: Once | INTRAVENOUS | Status: AC
  Administered 2017-01-14: 510 mg via INTRAVENOUS
  Filled 2017-01-14: qty 17

## 2017-01-14 NOTE — Progress Notes (Signed)
Provider: Natale Lay   Treatment: Ferumoxytol (FERAHEME) 510 mg in sodium chloride 0.9 % 100 mL IVPB  Patient tolerated procedure well with no reaction. Patient observed 30 minutes post transfusion and vitals taken and within normal limits. Patient alert, oriented, and ambulatory at time of discharge. Discharge instruction given to patient and patient states an understanding.

## 2017-01-14 NOTE — Discharge Instructions (Signed)

## 2017-02-10 ENCOUNTER — Other Ambulatory Visit: Payer: Self-pay

## 2017-02-10 DIAGNOSIS — D500 Iron deficiency anemia secondary to blood loss (chronic): Secondary | ICD-10-CM

## 2017-02-11 ENCOUNTER — Ambulatory Visit (HOSPITAL_BASED_OUTPATIENT_CLINIC_OR_DEPARTMENT_OTHER): Payer: BC Managed Care – PPO | Admitting: Hematology and Oncology

## 2017-02-11 ENCOUNTER — Telehealth: Payer: Self-pay | Admitting: Hematology and Oncology

## 2017-02-11 ENCOUNTER — Ambulatory Visit (HOSPITAL_BASED_OUTPATIENT_CLINIC_OR_DEPARTMENT_OTHER): Payer: BC Managed Care – PPO

## 2017-02-11 ENCOUNTER — Encounter: Payer: Self-pay | Admitting: Hematology and Oncology

## 2017-02-11 ENCOUNTER — Other Ambulatory Visit (HOSPITAL_BASED_OUTPATIENT_CLINIC_OR_DEPARTMENT_OTHER): Payer: BC Managed Care – PPO

## 2017-02-11 ENCOUNTER — Other Ambulatory Visit: Payer: Self-pay | Admitting: Hematology and Oncology

## 2017-02-11 VITALS — BP 142/70 | HR 90 | Temp 98.4°F | Resp 18

## 2017-02-11 VITALS — BP 129/70 | HR 72 | Temp 98.6°F | Resp 17 | Ht 64.0 in | Wt 263.2 lb

## 2017-02-11 DIAGNOSIS — D649 Anemia, unspecified: Secondary | ICD-10-CM | POA: Diagnosis not present

## 2017-02-11 DIAGNOSIS — D509 Iron deficiency anemia, unspecified: Secondary | ICD-10-CM | POA: Diagnosis not present

## 2017-02-11 DIAGNOSIS — D500 Iron deficiency anemia secondary to blood loss (chronic): Secondary | ICD-10-CM

## 2017-02-11 DIAGNOSIS — N920 Excessive and frequent menstruation with regular cycle: Secondary | ICD-10-CM

## 2017-02-11 LAB — CBC & DIFF AND RETIC
BASO%: 0.5 % (ref 0.0–2.0)
Basophils Absolute: 0 10*3/uL (ref 0.0–0.1)
EOS ABS: 0.2 10*3/uL (ref 0.0–0.5)
EOS%: 3.3 % (ref 0.0–7.0)
HCT: 31.1 % — ABNORMAL LOW (ref 34.8–46.6)
HEMOGLOBIN: 9.2 g/dL — AB (ref 11.6–15.9)
Immature Retic Fract: 14.1 % — ABNORMAL HIGH (ref 1.60–10.00)
LYMPH%: 38.5 % (ref 14.0–49.7)
MCH: 19.7 pg — ABNORMAL LOW (ref 25.1–34.0)
MCHC: 29.6 g/dL — ABNORMAL LOW (ref 31.5–36.0)
MCV: 66.4 fL — AB (ref 79.5–101.0)
MONO#: 0.3 10*3/uL (ref 0.1–0.9)
MONO%: 5.8 % (ref 0.0–14.0)
NEUT%: 51.9 % (ref 38.4–76.8)
NEUTROS ABS: 3.1 10*3/uL (ref 1.5–6.5)
Platelets: 311 10*3/uL (ref 145–400)
RBC: 4.68 10*6/uL (ref 3.70–5.45)
RDW: 35.5 % — ABNORMAL HIGH (ref 11.2–14.5)
Retic %: 1.92 % (ref 0.70–2.10)
Retic Ct Abs: 89.86 10*3/uL (ref 33.70–90.70)
WBC: 5.9 10*3/uL (ref 3.9–10.3)
lymph#: 2.3 10*3/uL (ref 0.9–3.3)

## 2017-02-11 LAB — COMPREHENSIVE METABOLIC PANEL
ALT: 15 U/L (ref 0–55)
ANION GAP: 6 meq/L (ref 3–11)
AST: 15 U/L (ref 5–34)
Albumin: 3.5 g/dL (ref 3.5–5.0)
Alkaline Phosphatase: 48 U/L (ref 40–150)
BUN: 12 mg/dL (ref 7.0–26.0)
CO2: 26 mEq/L (ref 22–29)
CREATININE: 0.7 mg/dL (ref 0.6–1.1)
Calcium: 9.1 mg/dL (ref 8.4–10.4)
Chloride: 107 mEq/L (ref 98–109)
EGFR: >60 mL/min/{1.73_m2} (ref >60)
Glucose: 108 mg/dl (ref 70–140)
Potassium: 3.4 mEq/L — ABNORMAL LOW (ref 3.5–5.1)
Sodium: 139 mEq/L (ref 136–145)
TOTAL PROTEIN: 7.3 g/dL (ref 6.4–8.3)

## 2017-02-11 LAB — IRON AND TIBC
%SAT: 20 % — ABNORMAL LOW (ref 21–57)
Iron: 58 ug/dL (ref 41–142)
TIBC: 289 ug/dL (ref 236–444)
UIBC: 231 ug/dL (ref 120–384)

## 2017-02-11 LAB — FERRITIN: FERRITIN: 60 ng/mL (ref 9–269)

## 2017-02-11 LAB — DRAW EXTRA CLOT TUBE

## 2017-02-11 MED ORDER — SODIUM CHLORIDE 0.9 % IV SOLN
Freq: Once | INTRAVENOUS | Status: AC
  Administered 2017-02-11: 11:00:00 via INTRAVENOUS

## 2017-02-11 MED ORDER — METHYLPREDNISOLONE SODIUM SUCC 125 MG IJ SOLR
60.0000 mg | Freq: Every day | INTRAMUSCULAR | Status: DC
  Administered 2017-02-11: 60 mg via INTRAVENOUS

## 2017-02-11 MED ORDER — METHYLPREDNISOLONE SODIUM SUCC 125 MG IJ SOLR
INTRAMUSCULAR | Status: AC
  Filled 2017-02-11: qty 2

## 2017-02-11 MED ORDER — SODIUM CHLORIDE 0.9 % IV SOLN
510.0000 mg | INTRAVENOUS | Status: DC
  Administered 2017-02-11: 510 mg via INTRAVENOUS
  Filled 2017-02-11: qty 17

## 2017-02-11 NOTE — Progress Notes (Signed)
Pt tolerated infusion well. Pt monitored 30 minutes post infusion. Pt and VS stable at discharge.  

## 2017-02-11 NOTE — Progress Notes (Signed)
[place] OFFICE PROGRESS NOTE  [name] [name], [name] Cabal, MD SUMMARY OF HEMATOLOGIC HISTORY:  [name] [name] was found to have abnormal CBC from recent blood work for evaluation of fatigue. [name] [name] denies recent chest pain on exertion, pre-syncopal episodes, or palpitations. [name] [name] does complain of leg cramps, dizziness, shortness of breath on minimal exertion, and frequent headaches. Recent CBC done last month in May 2015 shows significant anemia with hemoglobin 7.9, MCV of 55 and platelet count of 409. [name] [name] received one dose of intravenous iron on 07/23/2013. After infusion, [name] [name] complained of scratchy throat and developed hives. Subsequently, [name] [name] received premedication with Solu-Medrol and [name] [name] was able to complete her treatment in July 2015 [name] [name] received further IV iron in June 2017 for recurrent iron deficiency anemia INTERVAL HISTORY: [name] [name] 42 y.o. female returns for further follow-up [name] [name] has improved energy level and ice pica Recently, [name] [name] noted heavy menstruation [name] [name] denies any recent signs or symptoms of bleeding such as spontaneous epistaxis, hematuria or hematochezia. [name] [name] denies recent infusion reaction  I have reviewed [name] past medical history, past surgical history, social history and family history with [name] [name] and they are unchanged from previous note.  ALLERGIES:  is allergic to feraheme [ferumoxytol].  MEDICATIONS:  Current Outpatient Medications  Medication Sig Dispense Refill  . Cholecalciferol (VITAMIN D) 2000 UNITS tablet Take 2,000 Units by mouth daily.    . rizatriptan (MAXALT-MLT) 10 MG disintegrating tablet Take 10 mg by mouth as needed for migraine. May repeat in 2 hours if needed    . SUMAtriptan (IMITREX) 100 MG tablet Take 1 tablet at earliest onset of headache.  May repeat once in 2 hours if headache persists or recurs. 10 tablet 2   No current facility-administered medications for this visit.      REVIEW OF SYSTEMS:   Constitutional:  Denies fevers, chills or night sweats Eyes: Denies blurriness of vision Ears, nose, mouth, throat, and face: Denies mucositis or sore throat Respiratory: Denies cough, dyspnea or wheezes Cardiovascular: Denies palpitation, chest discomfort or lower extremity swelling Gastrointestinal:  Denies nausea, heartburn or change in bowel habits Skin: Denies abnormal skin rashes Lymphatics: Denies new lymphadenopathy or easy bruising Neurological:Denies numbness, tingling or new weaknesses Behavioral/Psych: Mood is stable, no new changes  All other systems were reviewed with [name] [name] and are negative.  PHYSICAL EXAMINATION: ECOG PERFORMANCE STATUS: 1 - Symptomatic but completely ambulatory  Vitals:   02/11/17 1016  BP: 129/70  Pulse: 72  Resp: 17  Temp: 98.6 F (37 C)  SpO2: 100%   Filed Weights   02/11/17 1016  Weight: 263 lb 3.2 oz (119.4 kg)    GENERAL:alert, no distress and comfortable SKIN: skin color, texture, turgor are normal, no rashes or significant lesions EYES: normal, Conjunctiva are pink and non-injected, sclera clear OROPHARYNX:no exudate, no erythema and lips, buccal mucosa, and tongue normal  NECK: supple, thyroid normal size, non-tender, without nodularity LYMPH:  no palpable lymphadenopathy in [name] cervical, axillary or inguinal LUNGS: clear to auscultation and percussion with normal breathing effort HEART: regular rate & rhythm and no murmurs and no lower extremity edema ABDOMEN:abdomen soft, non-tender and normal bowel sounds Musculoskeletal:no cyanosis of digits and no clubbing  NEURO: alert & oriented x 3 with fluent speech, no focal motor/sensory deficits  LABORATORY DATA:  I have reviewed [name] data as listed     Component Value Date/Time   NA 139 02/11/2017 1000   K 3.4 (L) 02/11/2017 1000   CL 103 02/28/2012 1607  CO2 26 02/11/2017 1000   GLUCOSE 108 02/11/2017 1000   BUN 12.0 02/11/2017 1000   CREATININE 0.7 02/11/2017 1000   CALCIUM 9.1  02/11/2017 1000   PROT 7.3 02/11/2017 1000   ALBUMIN 3.5 02/11/2017 1000   AST 15 02/11/2017 1000   ALT 15 02/11/2017 1000   ALKPHOS 48 02/11/2017 1000   BILITOT <0.22 02/11/2017 1000    No results found for: SPEP, UPEP  Lab Results  Component Value Date   WBC 5.9 02/11/2017   NEUTROABS 3.1 02/11/2017   HGB 9.2 (L) 02/11/2017   HCT 31.1 (L) 02/11/2017   MCV 66.4 (L) 02/11/2017   PLT 311 02/11/2017      Chemistry      Component Value Date/Time   NA 139 02/11/2017 1000   K 3.4 (L) 02/11/2017 1000   CL 103 02/28/2012 1607   CO2 26 02/11/2017 1000   BUN 12.0 02/11/2017 1000   CREATININE 0.7 02/11/2017 1000      Component Value Date/Time   CALCIUM 9.1 02/11/2017 1000   ALKPHOS 48 02/11/2017 1000   AST 15 02/11/2017 1000   ALT 15 02/11/2017 1000   BILITOT <0.22 02/11/2017 1000      ASSESSMENT & PLAN:  Iron deficiency anemia Previously, we have discussed potential causes of her iron deficiency anemia [name] [name] noted that [name] [name] does have heavy menorrhagia I recommend [name] [name] calls her gynecologist for further management of menorrhagia In [name] meantime, I recommend repeat iron transfusion until resolution of anemia We discussed [name] risks, benefits, of intravenous iron infusion versus blood transfusion and ultimately [name] [name] declined blood transfusion. [name] most likely cause of her anemia is due to chronic blood loss/malabsorption syndrome. We discussed some of [name] risks, benefits, and alternatives of intravenous iron infusions. [name] [name] is symptomatic from anemia and [name] iron level is critically low. [name] [name] tolerated oral iron supplement poorly and desires to achieved higher levels of iron faster for adequate hematopoesis. Some of [name] side-effects to be expected including risks of infusion reactions, phlebitis, headaches, nausea and fatigue.  [name] [name] is willing to proceed. [name] education material was dispensed.  Goal is to keep ferritin level greater than 50 and  resolution of anemia Due to history of reaction to iron, [name] [name] would continue premedication with steroids   Menorrhagia with regular cycle [name] [name] has chronic menorrhagia Recommend GYN evaluation and managment   Orders Placed This Encounter  Procedures  . CBC & Diff and Retic    Standing Status:   Future    Standing Expiration Date:   03/18/2018  . Ferritin    Standing Status:   Future    Standing Expiration Date:   02/11/2018  . Iron and TIBC    Standing Status:   Future    Standing Expiration Date:   03/18/2018    All questions were answered. [name] [name] knows to call [name] clinic with any problems, questions or concerns. No barriers to learning was detected.  I spent 10 minutes counseling [name] [name] face to face. [name] total time spent in [name] appointment was 15 minutes and more than 50% was on counseling.     [name], MD 1/4/201911:00 AM

## 2017-02-11 NOTE — Telephone Encounter (Signed)
Gave patient AVs and calendar of upcoming January and February appointments.  °

## 2017-02-11 NOTE — Patient Instructions (Signed)

## 2017-02-11 NOTE — Assessment & Plan Note (Signed)
Previously, we have discussed potential causes of her iron deficiency anemia The patient noted that she does have heavy menorrhagia I recommend she calls her gynecologist for further management of menorrhagia In the meantime, I recommend repeat iron transfusion until resolution of anemia We discussed the risks, benefits, of intravenous iron infusion versus blood transfusion and ultimately the patient declined blood transfusion. The most likely cause of her anemia is due to chronic blood loss/malabsorption syndrome. We discussed some of the risks, benefits, and alternatives of intravenous iron infusions. The patient is symptomatic from anemia and the iron level is critically low. She tolerated oral iron supplement poorly and desires to achieved higher levels of iron faster for adequate hematopoesis. Some of the side-effects to be expected including risks of infusion reactions, phlebitis, headaches, nausea and fatigue.  The patient is willing to proceed. Patient education material was dispensed.  Goal is to keep ferritin level greater than 50 and resolution of anemia Due to history of reaction to iron, she would continue premedication with steroids

## 2017-02-11 NOTE — Assessment & Plan Note (Signed)
She has chronic menorrhagia Recommend GYN evaluation and managment

## 2017-02-12 LAB — SEDIMENTATION RATE: SED RATE: 84 mm/h — AB (ref 0–32)

## 2017-02-22 ENCOUNTER — Inpatient Hospital Stay: Payer: BC Managed Care – PPO | Attending: Hematology and Oncology

## 2017-02-22 ENCOUNTER — Ambulatory Visit: Payer: BC Managed Care – PPO

## 2017-02-22 VITALS — BP 131/69 | HR 73 | Temp 98.9°F | Resp 17

## 2017-02-22 DIAGNOSIS — D509 Iron deficiency anemia, unspecified: Secondary | ICD-10-CM | POA: Insufficient documentation

## 2017-02-22 DIAGNOSIS — D500 Iron deficiency anemia secondary to blood loss (chronic): Secondary | ICD-10-CM

## 2017-02-22 MED ORDER — SODIUM CHLORIDE 0.9 % IV SOLN
510.0000 mg | INTRAVENOUS | Status: DC
  Administered 2017-02-22: 510 mg via INTRAVENOUS
  Filled 2017-02-22: qty 17

## 2017-02-22 MED ORDER — METHYLPREDNISOLONE SODIUM SUCC 125 MG IJ SOLR
60.0000 mg | Freq: Every day | INTRAMUSCULAR | Status: DC
  Administered 2017-02-22: 60 mg via INTRAVENOUS

## 2017-02-22 NOTE — Progress Notes (Signed)
Per MD Alvy Bimler proceed with iron tx today using 1/4 labs.  Pt tolerated iron well today, stayed 30 min post tx for observation.

## 2017-02-22 NOTE — Patient Instructions (Signed)

## 2017-03-17 ENCOUNTER — Encounter: Payer: Self-pay | Admitting: Hematology and Oncology

## 2017-03-25 ENCOUNTER — Ambulatory Visit: Payer: BC Managed Care – PPO

## 2017-03-25 ENCOUNTER — Encounter: Payer: Self-pay | Admitting: Hematology and Oncology

## 2017-03-25 ENCOUNTER — Telehealth: Payer: Self-pay | Admitting: Hematology and Oncology

## 2017-03-25 ENCOUNTER — Inpatient Hospital Stay: Payer: BC Managed Care – PPO | Attending: Hematology and Oncology

## 2017-03-25 ENCOUNTER — Inpatient Hospital Stay (HOSPITAL_BASED_OUTPATIENT_CLINIC_OR_DEPARTMENT_OTHER): Payer: BC Managed Care – PPO | Admitting: Hematology and Oncology

## 2017-03-25 VITALS — BP 173/80 | HR 86 | Temp 98.4°F | Resp 18

## 2017-03-25 DIAGNOSIS — N920 Excessive and frequent menstruation with regular cycle: Secondary | ICD-10-CM | POA: Diagnosis not present

## 2017-03-25 DIAGNOSIS — D500 Iron deficiency anemia secondary to blood loss (chronic): Secondary | ICD-10-CM | POA: Diagnosis not present

## 2017-03-25 DIAGNOSIS — Z79899 Other long term (current) drug therapy: Secondary | ICD-10-CM | POA: Diagnosis not present

## 2017-03-25 LAB — CBC WITH DIFFERENTIAL (CANCER CENTER ONLY)
Basophils Absolute: 0 10*3/uL (ref 0.0–0.1)
Basophils Relative: 0 %
Eosinophils Absolute: 0.2 10*3/uL (ref 0.0–0.5)
Eosinophils Relative: 3 %
HCT: 35.3 % (ref 34.8–46.6)
Hemoglobin: 10.5 g/dL — ABNORMAL LOW (ref 11.6–15.9)
Lymphocytes Relative: 40 %
Lymphs Abs: 2.4 10*3/uL (ref 0.9–3.3)
MCH: 22.5 pg — ABNORMAL LOW (ref 25.1–34.0)
MCHC: 29.7 g/dL — ABNORMAL LOW (ref 31.5–36.0)
MCV: 75.6 fL — ABNORMAL LOW (ref 79.5–101.0)
Monocytes Absolute: 0.3 10*3/uL (ref 0.1–0.9)
Monocytes Relative: 5 %
Neutro Abs: 3.2 10*3/uL (ref 1.5–6.5)
Neutrophils Relative %: 52 %
Platelet Count: 306 10*3/uL (ref 145–400)
RBC: 4.67 MIL/uL (ref 3.70–5.45)
RDW: 22.4 % — ABNORMAL HIGH (ref 11.2–14.5)
WBC Count: 6.2 10*3/uL (ref 3.9–10.3)

## 2017-03-25 LAB — RETICULOCYTES
RBC.: 4.67 MIL/uL (ref 3.70–5.45)
RETIC COUNT ABSOLUTE: 107.4 10*3/uL — AB (ref 33.7–90.7)
RETIC CT PCT: 2.3 % — AB (ref 0.7–2.1)

## 2017-03-25 LAB — IRON AND TIBC
Iron: 54 ug/dL (ref 41–142)
Saturation Ratios: 20 % — ABNORMAL LOW (ref 21–57)
TIBC: 265 ug/dL (ref 236–444)
UIBC: 211 ug/dL

## 2017-03-25 LAB — FERRITIN: Ferritin: 142 ng/mL (ref 9–269)

## 2017-03-25 MED ORDER — METHYLPREDNISOLONE SODIUM SUCC 125 MG IJ SOLR
60.0000 mg | Freq: Every day | INTRAMUSCULAR | Status: DC
  Administered 2017-03-25: 60 mg via INTRAVENOUS

## 2017-03-25 MED ORDER — SODIUM CHLORIDE 0.9 % IV SOLN
510.0000 mg | INTRAVENOUS | Status: DC
  Administered 2017-03-25: 510 mg via INTRAVENOUS
  Filled 2017-03-25: qty 17

## 2017-03-25 MED ORDER — SODIUM CHLORIDE 0.9 % IV SOLN
INTRAVENOUS | Status: DC
  Administered 2017-03-25: 11:00:00 via INTRAVENOUS

## 2017-03-25 MED ORDER — METHYLPREDNISOLONE SODIUM SUCC 125 MG IJ SOLR
INTRAMUSCULAR | Status: AC
  Filled 2017-03-25: qty 2

## 2017-03-25 NOTE — Progress Notes (Signed)
[place] OFFICE PROGRESS NOTE  [name], [name] Cabal, [name] [name] SUMMARY OF HEMATOLOGIC HISTORY:  She was found to have abnormal CBC from recent blood work for evaluation of fatigue. She denies recent chest pain on exertion, pre-syncopal episodes, or palpitations. She does complain of leg cramps, dizziness, shortness of breath on minimal exertion, and frequent headaches. Recent CBC done last month in May 2015 shows significant anemia with hemoglobin 7.9, MCV of 55 and platelet count of 409. She received one dose of intravenous iron on 07/23/2013. After infusion, she complained of scratchy throat and developed hives. Subsequently, she received premedication with Solu-Medrol and she was able to complete her treatment in July 2015 She received further IV iron in June 2017 for recurrent iron deficiency anemia In February 2019, EGD and colonoscopy excluded GI blood loss INTERVAL HISTORY: [name] [name] 42 y.o. female returns for further follow-up.  She complained of fatigue and pica. The patient denies any recent signs or symptoms of bleeding such as spontaneous epistaxis, hematuria or hematochezia. She complained of menorrhagia with associated cramps.  GYN appointment is pending.  She denies infusion reaction from recent IV iron  I have reviewed the past medical history, past surgical history, social history and family history with the patient and they are unchanged from previous note.  ALLERGIES:  is allergic to feraheme [ferumoxytol].  MEDICATIONS:  Current Outpatient Medications  Medication Sig Dispense Refill  . Cholecalciferol (VITAMIN D) 2000 UNITS tablet Take 2,000 Units by mouth daily.    . rizatriptan (MAXALT-MLT) 10 MG disintegrating tablet Take 10 mg by mouth as needed for migraine. May repeat in 2 hours if needed    . SUMAtriptan (IMITREX) 100 MG tablet Take 1 tablet at earliest onset of headache.  May repeat once in 2 hours if headache persists or recurs. 10 tablet 2    No current facility-administered medications for this visit.      REVIEW OF SYSTEMS:   Constitutional: Denies fevers, chills or night sweats Eyes: Denies blurriness of vision Ears, nose, mouth, throat, and face: Denies mucositis or sore throat Respiratory: Denies cough, dyspnea or wheezes Cardiovascular: Denies palpitation, chest discomfort or lower extremity swelling Gastrointestinal:  Denies nausea, heartburn or change in bowel habits Skin: Denies abnormal skin rashes Lymphatics: Denies new lymphadenopathy or easy bruising Neurological:Denies numbness, tingling or new weaknesses Behavioral/Psych: Mood is stable, no new changes  All other systems were reviewed with the patient and are negative.  PHYSICAL EXAMINATION: ECOG PERFORMANCE STATUS: 1 - Symptomatic but completely ambulatory  Vitals:   03/25/17 0925  BP: 140/71  Pulse: 74  Resp: 18  Temp: 98.5 F (36.9 C)  SpO2: 100%   Filed Weights   03/25/17 0925  Weight: 263 lb 11.2 oz (119.6 kg)    GENERAL:alert, no distress and comfortable SKIN: skin color, texture, turgor are normal, no rashes or significant lesions EYES: normal, Conjunctiva are pink and non-injected, sclera clear OROPHARYNX:no exudate, no erythema and lips, buccal mucosa, and tongue normal  NECK: supple, thyroid normal size, non-tender, without nodularity LYMPH:  no palpable lymphadenopathy in the cervical, axillary or inguinal LUNGS: clear to auscultation and percussion with normal breathing effort HEART: regular rate & rhythm and no murmurs and no lower extremity edema ABDOMEN:abdomen soft, non-tender and normal bowel sounds Musculoskeletal:no cyanosis of digits and no clubbing  NEURO: alert & oriented x 3 with fluent speech, no focal motor/sensory deficits  LABORATORY DATA:  I have reviewed the data as listed     Component Value Date/Time  NA 139 02/11/2017 1000   K 3.4 (L) 02/11/2017 1000   CL 103 02/28/2012 1607   CO2 26 02/11/2017 1000    GLUCOSE 108 02/11/2017 1000   BUN 12.0 02/11/2017 1000   CREATININE 0.7 02/11/2017 1000   CALCIUM 9.1 02/11/2017 1000   PROT 7.3 02/11/2017 1000   ALBUMIN 3.5 02/11/2017 1000   AST 15 02/11/2017 1000   ALT 15 02/11/2017 1000   ALKPHOS 48 02/11/2017 1000   BILITOT <0.22 02/11/2017 1000    No results found for: SPEP, UPEP  Lab Results  Component Value Date   WBC 6.2 03/25/2017   NEUTROABS 3.2 03/25/2017   HGB 9.2 (L) 02/11/2017   HCT 35.3 03/25/2017   MCV 75.6 (L) 03/25/2017   PLT 306 03/25/2017      Chemistry      Component Value Date/Time   NA 139 02/11/2017 1000   K 3.4 (L) 02/11/2017 1000   CL 103 02/28/2012 1607   CO2 26 02/11/2017 1000   BUN 12.0 02/11/2017 1000   CREATININE 0.7 02/11/2017 1000      Component Value Date/Time   CALCIUM 9.1 02/11/2017 1000   ALKPHOS 48 02/11/2017 1000   AST 15 02/11/2017 1000   ALT 15 02/11/2017 1000   BILITOT <0.22 02/11/2017 1000      ASSESSMENT & PLAN:  Iron deficiency anemia Previously, we have discussed potential causes of her iron deficiency anemia The patient noted that she does have heavy menorrhagia I recommend she calls her gynecologist for further management of menorrhagia In the meantime, I recommend repeat iron transfusion until resolution of anemia Recent GI workup was negative for GI source of bleeding We discussed the risks, benefits, of intravenous iron infusion versus blood transfusion and ultimately the patient declined blood transfusion. The most likely cause of her anemia is due to chronic blood loss/malabsorption syndrome. We discussed some of the risks, benefits, and alternatives of intravenous iron infusions. The patient is symptomatic from anemia and the iron level is critically low. She tolerated oral iron supplement poorly and desires to achieved higher levels of iron faster for adequate hematopoesis. Some of the side-effects to be expected including risks of infusion reactions, phlebitis, headaches,  nausea and fatigue.  The patient is willing to proceed. Patient education material was dispensed.  Goal is to keep ferritin level greater than 50 and resolution of anemia Due to history of reaction to iron, she would continue premedication with steroids   Menorrhagia with regular cycle She has chronic menorrhagia Recommend GYN evaluation and managment   No orders of the defined types were placed in this encounter.   All questions were answered. The patient knows to call the clinic with any problems, questions or concerns. No barriers to learning was detected.  I spent 15 minutes counseling the patient face to face. The total time spent in the appointment was 20 minutes and more than 50% was on counseling.     [name], [name] [name] 2/15/20199:39 AM

## 2017-03-25 NOTE — Patient Instructions (Signed)

## 2017-03-25 NOTE — Assessment & Plan Note (Signed)
She has chronic menorrhagia Recommend GYN evaluation and managment

## 2017-03-25 NOTE — Telephone Encounter (Signed)
Gave avs and calendar for February and april

## 2017-03-25 NOTE — Progress Notes (Signed)
Per Dr Alvy Bimler ok to proceed with Feraheme today without Ferritin resulting.

## 2017-03-25 NOTE — Assessment & Plan Note (Signed)
Previously, we have discussed potential causes of her iron deficiency anemia The patient noted that she does have heavy menorrhagia I recommend she calls her gynecologist for further management of menorrhagia In the meantime, I recommend repeat iron transfusion until resolution of anemia Recent GI workup was negative for GI source of bleeding We discussed the risks, benefits, of intravenous iron infusion versus blood transfusion and ultimately the patient declined blood transfusion. The most likely cause of her anemia is due to chronic blood loss/malabsorption syndrome. We discussed some of the risks, benefits, and alternatives of intravenous iron infusions. The patient is symptomatic from anemia and the iron level is critically low. She tolerated oral iron supplement poorly and desires to achieved higher levels of iron faster for adequate hematopoesis. Some of the side-effects to be expected including risks of infusion reactions, phlebitis, headaches, nausea and fatigue.  The patient is willing to proceed. Patient education material was dispensed.  Goal is to keep ferritin level greater than 50 and resolution of anemia Due to history of reaction to iron, she would continue premedication with steroids

## 2017-04-01 ENCOUNTER — Inpatient Hospital Stay: Payer: BC Managed Care – PPO

## 2017-04-01 VITALS — BP 144/76 | HR 70 | Temp 98.4°F | Resp 18

## 2017-04-01 DIAGNOSIS — D500 Iron deficiency anemia secondary to blood loss (chronic): Secondary | ICD-10-CM | POA: Diagnosis not present

## 2017-04-01 MED ORDER — METHYLPREDNISOLONE SODIUM SUCC 125 MG IJ SOLR
INTRAMUSCULAR | Status: AC
Start: 2017-04-01 — End: 2017-04-01
  Filled 2017-04-01: qty 2

## 2017-04-01 MED ORDER — METHYLPREDNISOLONE SODIUM SUCC 125 MG IJ SOLR
60.0000 mg | Freq: Every day | INTRAMUSCULAR | Status: DC
  Administered 2017-04-01: 60 mg via INTRAVENOUS

## 2017-04-01 MED ORDER — SODIUM CHLORIDE 0.9 % IV SOLN
510.0000 mg | INTRAVENOUS | Status: DC
  Administered 2017-04-01: 510 mg via INTRAVENOUS
  Filled 2017-04-01: qty 17

## 2017-04-01 MED ORDER — SODIUM CHLORIDE 0.9 % IV SOLN
Freq: Once | INTRAVENOUS | Status: AC
  Administered 2017-04-01: 13:00:00 via INTRAVENOUS

## 2017-04-01 NOTE — Progress Notes (Signed)
Patient declined to stay for post infusion observation period. Denies concerns or complaints on exit.

## 2017-04-01 NOTE — Patient Instructions (Signed)

## 2017-05-26 ENCOUNTER — Other Ambulatory Visit: Payer: Self-pay | Admitting: Hematology and Oncology

## 2017-05-26 DIAGNOSIS — D500 Iron deficiency anemia secondary to blood loss (chronic): Secondary | ICD-10-CM

## 2017-06-06 ENCOUNTER — Inpatient Hospital Stay: Payer: BC Managed Care – PPO | Attending: Hematology and Oncology

## 2017-06-06 ENCOUNTER — Inpatient Hospital Stay (HOSPITAL_BASED_OUTPATIENT_CLINIC_OR_DEPARTMENT_OTHER): Payer: BC Managed Care – PPO | Admitting: Hematology and Oncology

## 2017-06-06 ENCOUNTER — Inpatient Hospital Stay: Payer: BC Managed Care – PPO

## 2017-06-06 ENCOUNTER — Telehealth: Payer: Self-pay | Admitting: Hematology and Oncology

## 2017-06-06 VITALS — BP 152/81 | HR 72 | Temp 98.3°F | Resp 18 | Ht 64.0 in | Wt 269.1 lb

## 2017-06-06 VITALS — BP 151/69 | HR 75 | Temp 98.3°F | Resp 17

## 2017-06-06 DIAGNOSIS — D500 Iron deficiency anemia secondary to blood loss (chronic): Secondary | ICD-10-CM

## 2017-06-06 DIAGNOSIS — Z79899 Other long term (current) drug therapy: Secondary | ICD-10-CM | POA: Diagnosis not present

## 2017-06-06 DIAGNOSIS — N920 Excessive and frequent menstruation with regular cycle: Secondary | ICD-10-CM | POA: Diagnosis not present

## 2017-06-06 LAB — CBC WITH DIFFERENTIAL/PLATELET
BASOS ABS: 0 10*3/uL (ref 0.0–0.1)
Basophils Relative: 1 %
EOS ABS: 0.1 10*3/uL (ref 0.0–0.5)
Eosinophils Relative: 2 %
HEMATOCRIT: 36.4 % (ref 34.8–46.6)
Hemoglobin: 11.4 g/dL — ABNORMAL LOW (ref 11.6–15.9)
Lymphocytes Relative: 39 %
Lymphs Abs: 2.2 10*3/uL (ref 0.9–3.3)
MCH: 23.8 pg — AB (ref 25.1–34.0)
MCHC: 31.3 g/dL — ABNORMAL LOW (ref 31.5–36.0)
MCV: 76.1 fL — AB (ref 79.5–101.0)
MONOS PCT: 7 %
Monocytes Absolute: 0.4 10*3/uL (ref 0.1–0.9)
NEUTROS PCT: 51 %
Neutro Abs: 2.8 10*3/uL (ref 1.5–6.5)
PLATELETS: 327 10*3/uL (ref 145–400)
RBC: 4.79 MIL/uL (ref 3.70–5.45)
RDW: 15.4 % — AB (ref 11.2–14.5)
WBC: 5.5 10*3/uL (ref 3.9–10.3)

## 2017-06-06 LAB — IRON AND TIBC
Iron: 76 ug/dL (ref 41–142)
SATURATION RATIOS: 29 % (ref 21–57)
TIBC: 263 ug/dL (ref 236–444)
UIBC: 187 ug/dL

## 2017-06-06 LAB — FERRITIN: Ferritin: 130 ng/mL (ref 9–269)

## 2017-06-06 MED ORDER — SODIUM CHLORIDE 0.9 % IV SOLN
Freq: Once | INTRAVENOUS | Status: AC
  Administered 2017-06-06: 10:00:00 via INTRAVENOUS

## 2017-06-06 MED ORDER — METHYLPREDNISOLONE SODIUM SUCC 125 MG IJ SOLR
60.0000 mg | Freq: Every day | INTRAMUSCULAR | Status: DC
  Administered 2017-06-06: 60 mg via INTRAVENOUS

## 2017-06-06 MED ORDER — METHYLPREDNISOLONE SODIUM SUCC 125 MG IJ SOLR
INTRAMUSCULAR | Status: AC
  Filled 2017-06-06: qty 2

## 2017-06-06 MED ORDER — SODIUM CHLORIDE 0.9 % IV SOLN
510.0000 mg | INTRAVENOUS | Status: DC
  Administered 2017-06-06: 510 mg via INTRAVENOUS
  Filled 2017-06-06: qty 17

## 2017-06-06 NOTE — Patient Instructions (Signed)

## 2017-06-06 NOTE — Telephone Encounter (Signed)
Gave patient AVs and calendar of upcoming may and June appointments

## 2017-06-07 ENCOUNTER — Encounter: Payer: Self-pay | Admitting: Hematology and Oncology

## 2017-06-07 NOTE — Assessment & Plan Note (Signed)
Previously, we have discussed potential causes of her iron deficiency anemia The patient noted that she does have heavy menorrhagia I recommend she calls her gynecologist for further management of menorrhagia In the meantime, I recommend repeat iron transfusion until resolution of anemia Recent GI workup was negative for GI source of bleeding We discussed the risks, benefits, of intravenous iron infusion versus blood transfusion and ultimately the patient declined blood transfusion. The most likely cause of her anemia is due to chronic blood loss/malabsorption syndrome. We discussed some of the risks, benefits, and alternatives of intravenous iron infusions. The patient is symptomatic from anemia and the iron level is critically low. She tolerated oral iron supplement poorly and desires to achieved higher levels of iron faster for adequate hematopoesis. Some of the side-effects to be expected including risks of infusion reactions, phlebitis, headaches, nausea and fatigue.  The patient is willing to proceed. Patient education material was dispensed.  Goal is to keep ferritin level greater than 50 and resolution of anemia Due to history of reaction to iron, she would continue premedication with steroids I plan to recheck CBC again next month

## 2017-06-07 NOTE — Progress Notes (Signed)
[place] OFFICE PROGRESS NOTE  [name], [name], MD  ASSESSMENT & PLAN:  Iron deficiency anemia Previously, we have discussed potential causes of her iron deficiency anemia The patient noted that she does have heavy menorrhagia I recommend she calls her gynecologist for further management of menorrhagia In the meantime, I recommend repeat iron transfusion until resolution of anemia Recent GI workup was negative for GI source of bleeding We discussed the risks, benefits, of intravenous iron infusion versus blood transfusion and ultimately the patient declined blood transfusion. The most likely cause of her anemia is due to chronic blood loss/malabsorption syndrome. We discussed some of the risks, benefits, and alternatives of intravenous iron infusions. The patient is symptomatic from anemia and the iron level is critically low. She tolerated oral iron supplement poorly and desires to achieved higher levels of iron faster for adequate hematopoesis. Some of the side-effects to be expected including risks of infusion reactions, phlebitis, headaches, nausea and fatigue.  The patient is willing to proceed. Patient education material was dispensed.  Goal is to keep ferritin level greater than 50 and resolution of anemia Due to history of reaction to iron, she would continue premedication with steroids I plan to recheck CBC again next month  Menorrhagia with regular cycle She has chronic menorrhagia Recommend GYN evaluation and managment   Orders Placed This Encounter  Procedures  . Ferritin    Standing Status:   Standing    Number of Occurrences:   9    Standing Expiration Date:   06/07/2018  . CBC with Differential/Platelet    Standing Status:   Standing    Number of Occurrences:   9    Standing Expiration Date:   06/07/2018  . Iron and TIBC    Standing Status:   Standing    Number of Occurrences:   9    Standing Expiration Date:   06/07/2018    INTERVAL  HISTORY: [name] [name] 42 y.o. female returns for further follow-up.  She continues to have heavy menstruation and is scheduled for endometrial ablation. She tolerated recent intravenous iron infusion well. She has less fatigue with repeat iron infusion. She denies other forms of bleeding.  SUMMARY OF HEMATOLOGIC HISTORY:  She was found to have abnormal CBC from recent blood work for evaluation of fatigue. She denies recent chest pain on exertion, pre-syncopal episodes, or palpitations. She does complain of leg cramps, dizziness, shortness of breath on minimal exertion, and frequent headaches. Recent CBC done last month in May 2015 shows significant anemia with hemoglobin 7.9, MCV of 55 and platelet count of 409. She received one dose of intravenous iron on 07/23/2013. After infusion, she complained of scratchy throat and developed hives. Subsequently, she received premedication with Solu-Medrol and she was able to complete her treatment in July 2015 She received further IV iron in June 2017 for recurrent iron deficiency anemia In February 2019, EGD and colonoscopy excluded GI blood loss  I have reviewed the past medical history, past surgical history, social history and family history with the patient and they are unchanged from previous note.  ALLERGIES:  is allergic to feraheme [ferumoxytol].  MEDICATIONS:  Current Outpatient Medications  Medication Sig Dispense Refill  . Cholecalciferol (VITAMIN D) 2000 UNITS tablet Take 2,000 Units by mouth daily.    . rizatriptan (MAXALT-MLT) 10 MG disintegrating tablet Take 10 mg by mouth as needed for migraine. May repeat in 2 hours if needed    . SUMAtriptan (IMITREX) 100 MG tablet  Take 1 tablet at earliest onset of headache.  May repeat once in 2 hours if headache persists or recurs. 10 tablet 2   No current facility-administered medications for this visit.      REVIEW OF SYSTEMS:   Constitutional: Denies fevers, chills or night  sweats Eyes: Denies blurriness of vision Ears, nose, mouth, throat, and face: Denies mucositis or sore throat Respiratory: Denies cough, dyspnea or wheezes Cardiovascular: Denies palpitation, chest discomfort or lower extremity swelling Gastrointestinal:  Denies nausea, heartburn or change in bowel habits Skin: Denies abnormal skin rashes Lymphatics: Denies new lymphadenopathy or easy bruising Neurological:Denies numbness, tingling or new weaknesses Behavioral/Psych: Mood is stable, no new changes  All other systems were reviewed with the patient and are negative.  PHYSICAL EXAMINATION: ECOG PERFORMANCE STATUS: 0 - Asymptomatic  Vitals:   06/06/17 0915  BP: (!) 152/81  Pulse: 72  Resp: 18  Temp: 98.3 F (36.8 C)  SpO2: 100%   Filed Weights   06/06/17 0915  Weight: 269 lb 1.6 oz (122.1 kg)    GENERAL:alert, no distress and comfortable NEURO: alert & oriented x 3 with fluent speech, no focal motor/sensory deficits  LABORATORY DATA:  I have reviewed the data as listed     Component Value Date/Time   NA 139 02/11/2017 1000   K 3.4 (L) 02/11/2017 1000   CL 103 02/28/2012 1607   CO2 26 02/11/2017 1000   GLUCOSE 108 02/11/2017 1000   BUN 12.0 02/11/2017 1000   CREATININE 0.7 02/11/2017 1000   CALCIUM 9.1 02/11/2017 1000   PROT 7.3 02/11/2017 1000   ALBUMIN 3.5 02/11/2017 1000   AST 15 02/11/2017 1000   ALT 15 02/11/2017 1000   ALKPHOS 48 02/11/2017 1000   BILITOT <0.22 02/11/2017 1000    No results found for: SPEP, UPEP  Lab Results  Component Value Date   WBC 5.5 06/06/2017   NEUTROABS 2.8 06/06/2017   HGB 11.4 (L) 06/06/2017   HCT 36.4 06/06/2017   MCV 76.1 (L) 06/06/2017   PLT 327 06/06/2017      Chemistry      Component Value Date/Time   NA 139 02/11/2017 1000   K 3.4 (L) 02/11/2017 1000   CL 103 02/28/2012 1607   CO2 26 02/11/2017 1000   BUN 12.0 02/11/2017 1000   CREATININE 0.7 02/11/2017 1000      Component Value Date/Time   CALCIUM 9.1  02/11/2017 1000   ALKPHOS 48 02/11/2017 1000   AST 15 02/11/2017 1000   ALT 15 02/11/2017 1000   BILITOT <0.22 02/11/2017 1000      I spent 10 minutes counseling the patient face to face. The total time spent in the appointment was 15 minutes and more than 50% was on counseling.   All questions were answered. The patient knows to call the clinic with any problems, questions or concerns. No barriers to learning was detected.    [name], MD 4/30/201911:00 AM

## 2017-06-07 NOTE — Assessment & Plan Note (Signed)
She has chronic menorrhagia Recommend GYN evaluation and managment

## 2017-06-29 ENCOUNTER — Encounter (HOSPITAL_COMMUNITY): Payer: Self-pay | Admitting: Certified Registered Nurse Anesthetist

## 2017-06-29 ENCOUNTER — Ambulatory Visit (HOSPITAL_COMMUNITY)
Admission: RE | Admit: 2017-06-29 | Discharge: 2017-06-29 | Disposition: A | Discharge status: Home / Self Care | Payer: BC Managed Care – PPO | Source: Ambulatory Visit | Attending: Obstetrics and Gynecology | Admitting: Obstetrics and Gynecology

## 2017-06-29 ENCOUNTER — Ambulatory Visit (HOSPITAL_COMMUNITY): Payer: BC Managed Care – PPO | Admitting: Anesthesiology

## 2017-06-29 ENCOUNTER — Encounter (HOSPITAL_COMMUNITY)
Admission: RE | Disposition: A | Discharge status: Home / Self Care | Payer: Self-pay | Source: Ambulatory Visit | Attending: Obstetrics and Gynecology

## 2017-06-29 ENCOUNTER — Other Ambulatory Visit: Payer: Self-pay | Admitting: Obstetrics and Gynecology

## 2017-06-29 ENCOUNTER — Other Ambulatory Visit: Payer: Self-pay

## 2017-06-29 DIAGNOSIS — N920 Excessive and frequent menstruation with regular cycle: Secondary | ICD-10-CM | POA: Insufficient documentation

## 2017-06-29 DIAGNOSIS — E7800 Pure hypercholesterolemia, unspecified: Secondary | ICD-10-CM | POA: Diagnosis not present

## 2017-06-29 DIAGNOSIS — E559 Vitamin D deficiency, unspecified: Secondary | ICD-10-CM | POA: Insufficient documentation

## 2017-06-29 DIAGNOSIS — N840 Polyp of corpus uteri: Secondary | ICD-10-CM | POA: Insufficient documentation

## 2017-06-29 DIAGNOSIS — I10 Essential (primary) hypertension: Secondary | ICD-10-CM | POA: Diagnosis not present

## 2017-06-29 DIAGNOSIS — Z888 Allergy status to other drugs, medicaments and biological substances status: Secondary | ICD-10-CM | POA: Diagnosis not present

## 2017-06-29 DIAGNOSIS — D251 Intramural leiomyoma of uterus: Secondary | ICD-10-CM | POA: Diagnosis not present

## 2017-06-29 DIAGNOSIS — E282 Polycystic ovarian syndrome: Secondary | ICD-10-CM | POA: Diagnosis not present

## 2017-06-29 DIAGNOSIS — D696 Thrombocytopenia, unspecified: Secondary | ICD-10-CM

## 2017-06-29 DIAGNOSIS — M869 Osteomyelitis, unspecified: Secondary | ICD-10-CM

## 2017-06-29 HISTORY — PX: DILATATION & CURETTAGE/HYSTEROSCOPY WITH MYOSURE: SHX6511

## 2017-06-29 HISTORY — PX: INTRAUTERINE DEVICE (IUD) INSERTION: SHX5877

## 2017-06-29 LAB — CBC
HCT: 34.1 % — ABNORMAL LOW (ref 36.0–46.0)
Hemoglobin: 10.5 g/dL — ABNORMAL LOW (ref 12.0–15.0)
MCH: 24 pg — ABNORMAL LOW (ref 26.0–34.0)
MCHC: 30.8 g/dL (ref 30.0–36.0)
MCV: 78 fL (ref 78.0–100.0)
Platelets: 363 10*3/uL (ref 150–400)
RBC: 4.37 MIL/uL (ref 3.87–5.11)
RDW: 15.5 % (ref 11.5–15.5)
WBC: 8.1 10*3/uL (ref 4.0–10.5)

## 2017-06-29 LAB — PREGNANCY, URINE: PREG TEST UR: NEGATIVE

## 2017-06-29 SURGERY — DILATATION & CURETTAGE/HYSTEROSCOPY WITH MYOSURE
Anesthesia: General | Site: Vagina

## 2017-06-29 MED ORDER — PROPOFOL 10 MG/ML IV BOLUS
INTRAVENOUS | Status: AC
  Filled 2017-06-29: qty 20

## 2017-06-29 MED ORDER — PROMETHAZINE HCL 25 MG/ML IJ SOLN
INTRAMUSCULAR | Status: AC
  Administered 2017-06-29: 6.25 mg via INTRAVENOUS
  Filled 2017-06-29: qty 1

## 2017-06-29 MED ORDER — ONDANSETRON HCL 4 MG/2ML IJ SOLN
INTRAMUSCULAR | Status: AC
  Filled 2017-06-29: qty 2

## 2017-06-29 MED ORDER — FENTANYL CITRATE (PF) 100 MCG/2ML IJ SOLN
INTRAMUSCULAR | Status: DC | PRN
  Administered 2017-06-29 (×4): 50 ug via INTRAVENOUS

## 2017-06-29 MED ORDER — FENTANYL CITRATE (PF) 100 MCG/2ML IJ SOLN
INTRAMUSCULAR | Status: AC
  Filled 2017-06-29: qty 2

## 2017-06-29 MED ORDER — SCOPOLAMINE 1 MG/3DAYS TD PT72
1.0000 | MEDICATED_PATCH | Freq: Once | TRANSDERMAL | Status: DC
  Administered 2017-06-29: 1.5 mg via TRANSDERMAL

## 2017-06-29 MED ORDER — DEXAMETHASONE SODIUM PHOSPHATE 10 MG/ML IJ SOLN
INTRAMUSCULAR | Status: DC | PRN
  Administered 2017-06-29: 4 mg via INTRAVENOUS

## 2017-06-29 MED ORDER — BUPIVACAINE HCL (PF) 0.25 % IJ SOLN
INTRAMUSCULAR | Status: AC
  Filled 2017-06-29: qty 30

## 2017-06-29 MED ORDER — DEXAMETHASONE SODIUM PHOSPHATE 4 MG/ML IJ SOLN
INTRAMUSCULAR | Status: AC
  Filled 2017-06-29: qty 1

## 2017-06-29 MED ORDER — PROPOFOL 10 MG/ML IV BOLUS
INTRAVENOUS | Status: DC | PRN
  Administered 2017-06-29: 200 mg via INTRAVENOUS

## 2017-06-29 MED ORDER — OXYCODONE HCL 5 MG PO TABS: 5.0000 mg | ORAL_TABLET | Freq: Four times a day (QID) | ORAL | 0 refills | Status: DC | PRN

## 2017-06-29 MED ORDER — ONDANSETRON HCL 4 MG/2ML IJ SOLN
INTRAMUSCULAR | Status: DC | PRN
  Administered 2017-06-29: 4 mg via INTRAVENOUS

## 2017-06-29 MED ORDER — MIDAZOLAM HCL 2 MG/2ML IJ SOLN
INTRAMUSCULAR | Status: DC | PRN
  Administered 2017-06-29: 2 mg via INTRAVENOUS

## 2017-06-29 MED ORDER — SODIUM CHLORIDE 0.9 % IR SOLN
Status: DC | PRN
  Administered 2017-06-29: 3000 mL

## 2017-06-29 MED ORDER — KETOROLAC TROMETHAMINE 30 MG/ML IJ SOLN
INTRAMUSCULAR | Status: DC | PRN
  Administered 2017-06-29: 30 mg via INTRAVENOUS

## 2017-06-29 MED ORDER — MIDAZOLAM HCL 2 MG/2ML IJ SOLN
INTRAMUSCULAR | Status: AC
  Filled 2017-06-29: qty 2

## 2017-06-29 MED ORDER — IBUPROFEN 800 MG PO TABS: 800.0000 mg | ORAL_TABLET | Freq: Three times a day (TID) | ORAL | 0 refills | Status: DC | PRN

## 2017-06-29 MED ORDER — HYDROMORPHONE HCL 1 MG/ML IJ SOLN: 0.2500 mg | INTRAMUSCULAR | Status: DC | PRN

## 2017-06-29 MED ORDER — LACTATED RINGERS IV SOLN
INTRAVENOUS | Status: DC
  Administered 2017-06-29: 125 mL/h via INTRAVENOUS
  Administered 2017-06-29: 14:00:00 via INTRAVENOUS

## 2017-06-29 MED ORDER — LIDOCAINE HCL (CARDIAC) PF 100 MG/5ML IV SOSY
PREFILLED_SYRINGE | INTRAVENOUS | Status: DC | PRN
  Administered 2017-06-29: 50 mg via INTRAVENOUS

## 2017-06-29 MED ORDER — PROMETHAZINE HCL 25 MG/ML IJ SOLN
6.2500 mg | INTRAMUSCULAR | Status: DC | PRN
  Administered 2017-06-29: 6.25 mg via INTRAVENOUS

## 2017-06-29 MED ORDER — BUPIVACAINE HCL (PF) 0.25 % IJ SOLN
INTRAMUSCULAR | Status: DC | PRN
  Administered 2017-06-29: 20 mL

## 2017-06-29 MED ORDER — KETOROLAC TROMETHAMINE 30 MG/ML IJ SOLN
INTRAMUSCULAR | Status: AC
  Filled 2017-06-29: qty 1

## 2017-06-29 MED ORDER — SILVER NITRATE-POT NITRATE 75-25 % EX MISC
CUTANEOUS | Status: DC | PRN
  Administered 2017-06-29: 2 via TOPICAL

## 2017-06-29 MED ORDER — SCOPOLAMINE 1 MG/3DAYS TD PT72
MEDICATED_PATCH | TRANSDERMAL | Status: AC
  Administered 2017-06-29: 1.5 mg via TRANSDERMAL
  Filled 2017-06-29: qty 1

## 2017-06-29 SURGICAL SUPPLY — 18 items
CANISTER SUCT 3000ML PPV (MISCELLANEOUS) ×3 IMPLANT
CATH ROBINSON RED A/P 16FR (CATHETERS) ×3 IMPLANT
DEVICE MYOSURE LITE (MISCELLANEOUS) IMPLANT
DEVICE MYOSURE REACH (MISCELLANEOUS) ×2 IMPLANT
FILTER ARTHROSCOPY CONVERTOR (FILTER) ×3 IMPLANT
GLOVE BIOGEL M 6.5 STRL (GLOVE) ×6 IMPLANT
GLOVE BIOGEL PI IND STRL 6.5 (GLOVE) ×1 IMPLANT
GLOVE BIOGEL PI IND STRL 7.0 (GLOVE) ×1 IMPLANT
GLOVE BIOGEL PI INDICATOR 6.5 (GLOVE) ×2
GLOVE BIOGEL PI INDICATOR 7.0 (GLOVE) ×2
GOWN STRL REUS W/TWL LRG LVL3 (GOWN DISPOSABLE) ×6 IMPLANT
PACK VAGINAL MINOR WOMEN LF (CUSTOM PROCEDURE TRAY) ×3 IMPLANT
PAD OB MATERNITY 4.3X12.25 (PERSONAL CARE ITEMS) ×3 IMPLANT
PAD PREP 24X48 CUFFED NSTRL (MISCELLANEOUS) ×3 IMPLANT
SEAL ROD LENS SCOPE MYOSURE (ABLATOR) ×3 IMPLANT
TOWEL OR 17X24 6PK STRL BLUE (TOWEL DISPOSABLE) ×6 IMPLANT
TUBING AQUILEX INFLOW (TUBING) ×3 IMPLANT
TUBING AQUILEX OUTFLOW (TUBING) ×3 IMPLANT

## 2017-06-29 NOTE — Anesthesia Preprocedure Evaluation (Signed)
Anesthesia Evaluation  Patient identified by MRN, date of birth, ID band Patient awake    Reviewed: Allergy & Precautions, NPO status , Patient's Chart, lab work & pertinent test results  Airway Mallampati: II  TM Distance: >3 FB Neck ROM: Full    Dental  (+) Dental Advisory Given   Pulmonary neg pulmonary ROS,    breath sounds clear to auscultation       Cardiovascular negative cardio ROS   Rhythm:Regular Rate:Normal     Neuro/Psych negative neurological ROS     GI/Hepatic negative GI ROS, Neg liver ROS,   Endo/Other  negative endocrine ROS  Renal/GU negative Renal ROS     Musculoskeletal   Abdominal   Peds  Hematology  (+) anemia ,   Anesthesia Other Findings   Reproductive/Obstetrics                             Lab Results  Component Value Date   WBC 5.5 06/06/2017   HGB 11.4 (L) 06/06/2017   HCT 36.4 06/06/2017   MCV 76.1 (L) 06/06/2017   PLT 327 06/06/2017   Lab Results  Component Value Date   CREATININE 0.7 02/11/2017   BUN 12.0 02/11/2017   NA 139 02/11/2017   K 3.4 (L) 02/11/2017   CL 103 02/28/2012   CO2 26 02/11/2017    Anesthesia Physical Anesthesia Plan  ASA: II  Anesthesia Plan: General   Post-op Pain Management:    Induction: Intravenous  PONV Risk Score and Plan: 4 or greater and Scopolamine patch - Pre-op, Midazolam, Dexamethasone, Ondansetron and Treatment may vary due to age or medical condition  Airway Management Planned: LMA  Additional Equipment:   Intra-op Plan:   Post-operative Plan: Extubation in OR  Informed Consent: I have reviewed the patients History and Physical, chart, labs and discussed the procedure including the risks, benefits and alternatives for the proposed anesthesia with the patient or authorized representative who has indicated his/her understanding and acceptance.   Dental advisory given  Plan Discussed with:    Anesthesia Plan Comments:         Anesthesia Quick Evaluation

## 2017-06-29 NOTE — Discharge Instructions (Signed)

## 2017-06-29 NOTE — OR Nursing (Signed)
Mirena 52mg  IUD inserted into patient by  Dr. Landry Mellow Lot number:[id number] YEB:[id number] 2021

## 2017-06-29 NOTE — H&P (Signed)
Subjective: Chief Complaint(s):   Pre-op history and physical / Menorrhagia/ endometrial mass   HPI:  General 42 y/o presents for preop hisotry and physical exam in preparation for hysteroscopy D&C removal of endometrial mass and Mirena IUD placement... her ultrasound showed a 12.6 cm x 8.4 cm x 8.0 cm uterus. she has multiple fibroids seen . the largest 3.5 cm. .. the endometrium is thickened with hyperechoic mass 3.4 cm avascular . she has menorrhagia. she has h/o 6 iron infusions since november 2018. the first 2 days of her mense sshe goes through a super plus tampon in an hour and a half. her menses last 5-7 days. she denies intermenstrual bleeding. changes a super plus tampon every 1 1/2 hour on heaviest day. Current Medication: Taking  Benadryl(diphenhydrAMINE HCl) , Notes: PRN     Dramamine(dimenhyDRINATE) 50 MG Tablet 1 tablet Orally daily as needed     Maxalt-MLT(Rizatriptan Benzoate) 10 MG Tablet Disintegrating 1 tablet on the tongue and allow to dissolve as needed one time Orally Take one at headache onset. May repeat once only in 2 hours     Vitamin D (Ergocalciferol) 50000 UNIT Capsule 1 capsule Orally weekly, Notes: start OTC once rx completed     Vitamin D3 2000 UNIT Capsule 1 capsule Orally Once a day     Lysteda 650 MG Tablet 2 tablets Orally Three times daily     Medication List reviewed and reconciled with the patient   Medical History:  hypercholesterolemia     migraine headaches     polycystic ovary syndrome     ovarian cysts     fibroids     seasonal allergies     Iron deficiency anemia, unspecified iron deficiency anemia type     Vitamin D deficiency     Bilateral carpal tunnel syndrome     Essential (primary) hypertension      Allergies/Intolerance:  N.K.D.A.   Gyn History:  Sexual activity not currently sexually active. Periods : regular--bad cramps. LMP 06/21/17. Birth control none. Last pap smear date 04/01/2015 HPV neg. Denies Last mammogram  date 03/2017 [facility]. Denies Abnormal pap smear. Denies STD. Menarche 59. Trying to get pregnant yes .   OB History:  Number of pregnancies 3. miscarriages 1. Pregnancy # 1 miscarriage 1993--age 76. Pregnancy # 2 09/24/06 , live birth, vaginal delivery, girl "[place]", birth weight 5.2. Pregnancy # 3 live birth, vaginal delivery, girl.   Surgical History:  laparoscopy/ovarian cyst 1998     lymph node removal from throat 2006     Carpal Tunnel Surgery-Right 04/2016     Right Foot Surgery   Hospitalization:  No Hospitalization History.   Family History:  Father: unknown    Mother: alive, hypertension, fibroids but had hysterectomy, diagnosed with Hypertension    2daughter(s) .    denies family HX gyn cancers, No Family History of Colon Cancer, Polyps, or Liver Disease.  Social History: General Tobacco use cigarettes: Never smoked, Tobacco history last updated 06/21/2017, Additional Findings: Tobacco Non-User Non-smoker for personal reasons.  no EXPOSURE TO PASSIVE SMOKE.  Alcohol: yes, occasionally.  Caffeine: yes, tea.  no Recreational drug use.  Marital Status: single, Divorced.  Children: 2, girls.  OCCUPATION: employed, [employer].  COMMUNICATION BARRIERS: visual impairment, wears glasses.   ROS: CONSTITUTIONAL No" options="no,yes" propid="91" itemid="193425" categoryid="10464" encounterid="[id number]"Chills No. No" options="no,yes" propid="91" itemid="172899" categoryid="10464" encounterid="[id number]"Fatigue No. No" options="no,yes" propid="91" itemid="10467" categoryid="10464" encounterid="[id number]"Fever No. No" options="no,yes" propid="91" itemid="193426" categoryid="10464" encounterid="[id number]"Night sweats No. No" options="no,yes" propid="91" itemid="444261" categoryid="10464" encounterid="[id number]"Recent travel outside [place] No.  No" options="no,yes" propid="91" itemid="193427" categoryid="10464" encounterid="[id number]"Sweats No. No" options="no,yes" propid="91" itemid="194825"  categoryid="10464" encounterid="[id number]"Weight change No.  OPHTHALMOLOGY no" options="no,yes" propid="91" itemid="12520" categoryid="12516" encounterid="[id number]"Blurring of vision no. no" options="no,yes" propid="91" itemid="193469" categoryid="12516" encounterid="[id number]"Change in vision no. no" options="no,yes" propid="91" itemid="194379" categoryid="12516" encounterid="[id number]"Double vision no.  ENT no" options="no,yes" propid="91" itemid="193612" categoryid="10481" encounterid="[id number]"Dizziness no. Nose bleeds no. Sore throat no. Teeth pain no.  ALLERGY no" options="no,yes" propid="91" itemid="202589" categoryid="138152" encounterid="[id number]"Hives no.  CARDIOLOGY no" options="no,yes" propid="91" itemid="193603" categoryid="10488" encounterid="[id number]"Chest pain no. no" options="no,yes" propid="91" itemid="199089" categoryid="10488" encounterid="[id number]"High blood pressure no. no" options="no,yes" propid="91" itemid="202598" categoryid="10488" encounterid="[id number]"Irregular heart beat no. no" options="no,yes" propid="91" itemid="10491" categoryid="10488" encounterid="[id number]"Leg edema no. no" options="no,yes" propid="91" itemid="10490" categoryid="10488" encounterid="[id number]"Palpitations no.  RESPIRATORY no" options="no" propid="91" itemid="270013" categoryid="138132" encounterid="[id number]"Shortness of breath no. no" options="no,yes" propid="91" itemid="172745" categoryid="138132" encounterid="[id number]"Cough no. no" options="no,yes" propid="91" itemid="193621" categoryid="138132" encounterid="[id number]"Wheezing no.  UROLOGY no" options="no,yes" propid="91" itemid="194377" categoryid="138166" encounterid="[id number]"Pain with urination no. no" options="no,yes" propid="91" itemid="193493" categoryid="138166" encounterid="[id number]"Urinary urgency no. no" options="no,yes" propid="91" itemid="193492" categoryid="138166" encounterid="[id number]"Urinary frequency no. no" options="no,yes" propid="91"  itemid="138171" categoryid="138166" encounterid="[id number]"Urinary incontinence no. No" options="no,yes" propid="91" itemid="138167" categoryid="138166" encounterid="[id number]"Difficulty urinating No. No" options="no,yes" propid="91" itemid="138168" categoryid="138166" encounterid="[id number]"Blood in urine No.  GASTROENTEROLOGY no" options="no,yes" propid="91" itemid="10496" categoryid="10494" encounterid="[id number]"Abdominal pain no. no" options="no,yes" propid="91" itemid="193447" categoryid="10494" encounterid="[id number]"Appetite change no. no" options="no,yes" propid="91" itemid="193448" categoryid="10494" encounterid="[id number]"Bloating/belching no. no" options="no,yes" propid="91" itemid="10503" categoryid="10494" encounterid="[id number]"Blood in stool or on toilet paper no. no" options="no,yes" propid="91" itemid="199106" categoryid="10494" encounterid="[id number]"Change in bowel movements no. no" options="no,yes" propid="91" itemid="10501" categoryid="10494" encounterid="[id number]"Constipation no. no" options="no,yes" propid="91" itemid="10502" categoryid="10494" encounterid="[id number]"Diarrhea no. no" options="no,yes" propid="91" itemid="199104" categoryid="10494" encounterid="10349943"Difficulty swallowing no. no" options="no,yes" propid="91" itemid="10499" categoryid="10494" encounterid="[id number]"Nausea no.  FEMALE REPRODUCTIVE no" options="no,yes" propid="91" itemid="453725" categoryid="10525" encounterid="[id number]"Vulvar pain no. no" options="no,yes" propid="91" itemid="453726" categoryid="10525" encounterid="[id number]"Vulvar rash no. yes" options="no, yes" propid="91" itemid="444315" categoryid="10525" encounterid="[id number]"Abnormal vaginal bleeding yes. no" options="no,yes" propid="91" itemid="186083" categoryid="10525" encounterid="[id number]"Breast pain no. no" options="no,yes" propid="91" itemid="186084" categoryid="10525" encounterid="[id number]"Nipple discharge no. no" options="no,yes" propid="91"  itemid="275823" categoryid="10525" encounterid="[id number]"Pain with intercourse no. no" options="no,yes" propid="91" itemid="186082" categoryid="10525" encounterid="[id number]"Pelvic pain no. no" options="no,yes" propid="91" itemid="278230" categoryid="10525" encounterid="[id number]"Unusual vaginal discharge no. no" options="no,yes" propid="91" itemid="278942" categoryid="10525" encounterid="[id number]"Vaginal itching no.  MUSCULOSKELETAL no" options="no,yes" propid="91" itemid="193461" categoryid="10514" encounterid="[id number]"Muscle aches no.  NEUROLOGY no" options="no,yes" propid="91" itemid="12513" categoryid="12512" encounterid="[id number]"Headache no. no" options="no,yes" propid="91" itemid="12514" categoryid="12512" encounterid="[id number]"Tingling/numbness no. no" options="no,yes" propid="91" itemid="193468" categoryid="12512" encounterid="10349943"Weakness no.  PSYCHOLOGY no" options="" propid="91" itemid="275919" categoryid="10520" encounterid="[id number]"Depression no. no" options="no,yes" propid="91" itemid="172748" categoryid="10520" encounterid="10349943"Anxiety no. no" options="no,yes" propid="91" itemid="199158" categoryid="10520" encounterid="10349943"Nervousness no. no" options="no,yes" propid="91" itemid="12502" categoryid="10520" encounterid="10349943"Sleep disturbances no. no " options="no,yes" propid="91" itemid="72718" categoryid="10520" encounterid="10349943"Suicidal ideation no .  ENDOCRINOLOGY no" options="no,yes" propid="91" itemid="194628" categoryid="12508" encounterid="[id number]"Excessive thirst no. no" options="no,yes" propid="91" itemid="196285" categoryid="12508" encounterid="10349943"Excessive urination no. no" options="no, yes" propid="91" itemid="444314" categoryid="12508" encounterid="10349943"Hair loss no. no" options="" propid="91" itemid="447284" categoryid="12508" encounterid="10349943"Heat or cold intolerance no.  HEMATOLOGY/LYMPH no" options="no,yes" propid="91" itemid="199152"  categoryid="138157" encounterid="10349943"Abnormal bleeding no. no" options="no,yes" propid="91" itemid="170653" categoryid="138157" encounterid="10349943"Easy bruising no. no" options="no,yes" propid="91" itemid="138158" categoryid="138157" encounterid="10349943"Swollen glands no.  DERMATOLOGY no" options="no,yes" propid="91" itemid="199126" categoryid="12503" encounterid="10349943"New/changing skin lesion no. no" options="no,yes" propid="91" itemid="12504" categoryid="12503" encounterid="10349943"Rash no. no" options="" propid="91" itemid="444313" categoryid="12503" encounterid="[id number]"Sores no.   Negative except as stated in HPI.  Objective: Vitals: Wt 271, Wt change -1 lb, Ht 64.25, BMI 46.15, Pulse sitting 81, BP sitting 132/74  Past Results: Examination:  General Examination alert, oriented, NAD " categoryPropId="10089" examid="193638"CONSTITUTIONAL: alert, oriented, NAD .  moist, warm" categoryPropId="10109" examid="193638"SKIN: moist, warm.  Conjunctiva clear" categoryPropId="21468" examid="193638"EYES: Conjunctiva clear.  clear to auscultation bilaterally" categoryPropId="87" examid="193638"LUNGS: clear to auscultation bilaterally.  regular rate and rhythm" categoryPropId="86" examid="193638"HEART: regular rate and rhythm.  soft, non-tender/non-distended, bowel sounds present " categoryPropId="88"  examid="193638"ABDOMEN: soft, non-tender/non-distended, bowel sounds present .  normal external genitalia, labia - unremarkable, vagina - pink moist mucosa, no lesions or abnormal discharge, cervix - no discharge or lesions or CMT, adnexa - no masses or tenderness, uterus - nontender and normal size on palpation " categoryPropId="13414" examid="193638"FEMALE GENITOURINARY: normal external genitalia, labia - unremarkable, vagina - pink moist mucosa, no lesions or abnormal discharge, cervix - no discharge or lesions or CMT, adnexa - no masses or tenderness, uterus - nontender and normal size on  palpation .  no edema present" categoryPropId="89" examid="193638"EXTREMITIES: no edema present.  affect normal, good eye contact" categoryPropId="16316" examid="193638"PSYCH: affect normal, good eye contact.  Physical Examination:    Assessment: Assessment:  Menorrhagia with regular cycle - N92.0 (Primary)     Endometrial mass - N94.89     Fibroids, intramural - D25.1     Plan: Treatment: Menorrhagia with regular cycle Notes: d/w pt R/B/A of hystereoscopy D&C removal of endometrial mass with myosure and mirena IUD placement. including but not limited to infection bleeding perforation of the uterus with the need for further surgery. pt voiced understanding and desires to proceed. . Endometrial mass Notes: d/w pt R/B/A of hystereoscopy D&C removal of endometrial mass with myosure and mirena IUD placement. including but not limited to infection bleeding perforation of the uterus with the need for further surgery. pt voiced understanding and desires to proceed. .. . Fibroids, intramural Notes: d/w pt R/B/A of hystereoscopy D&C removal of endometrial mass with myosure and mirena IUD placement. including but not limited to infection bleeding perforation of the uterus with the need for further surgery. pt voiced understanding and desires to proceed. she declines definitive therapy with hysterectomy. Marland Kitchen

## 2017-06-29 NOTE — Anesthesia Procedure Notes (Signed)
Procedure Name: LMA Insertion Date/Time: 06/29/2017 12:28 PM Performed by: Bufford Spikes, CRNA Pre-anesthesia Checklist: Patient identified, Emergency Drugs available, Suction available and Patient being monitored Patient Re-evaluated:Patient Re-evaluated prior to induction Oxygen Delivery Method: Circle system utilized Preoxygenation: Pre-oxygenation with 100% oxygen Induction Type: IV induction Ventilation: Mask ventilation without difficulty LMA: LMA inserted LMA Size: 4.0 Number of attempts: 1 Placement Confirmation: positive ETCO2 Tube secured with: Tape Dental Injury: Teeth and Oropharynx as per pre-operative assessment

## 2017-06-29 NOTE — Op Note (Signed)
06/29/2017  1:29 PM  PATIENT:  [name] [name]  42 y.o. 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.1  POST-OPERATIVE DIAGNOSIS:  menorrhagia with regular cycle  PROCEDURE:  Procedure(s) with comments: DILATATION & CURETTAGE/HYSTEROSCOPY WITH MYOSURE POLYPECTOMY (N/A) INTRAUTERINE DEVICE (IUD) INSERTION (N/A) - Mirena Placement  SURGEON:  Surgeon(s) and Role:    Christophe Louis, MD - Primary  PHYSICIAN ASSISTANT:   ASSISTANTS: none   ANESTHESIA:   general  EBL:  10 mL   BLOOD ADMINISTERED:none  DRAINS: none   LOCAL MEDICATIONS USED:  MARCAINE     SPECIMEN:  Source of Specimen:  endometrial mass and curettings   DISPOSITION OF SPECIMEN:  PATHOLOGY  COUNTS:  YES  TOURNIQUET:  * No tourniquets in log *  DICTATION: .Dragon Dictation  PLAN OF CARE: Discharge to home after PACU  PATIENT DISPOSITION:  PACU - hemodynamically stable.   Delay start of Pharmacological VTE agent (>24hrs) due to surgical blood loss or risk of bleeding: not   Findings: normal external genitalia.. Normal vaginal mucosa and cervix. .. Endometrium  Contains a mass approximately 3 cm in size appears to be a fundal polyp...   Procedure: Patient was taken to the operating room where she was placed under general anesthesia. She was placed in the dorsal lithotomy position. She was prepped and draped in the usual sterile fashion. A speculum was placed into the vaginal vault. The anterior lip of the cervix was grasped with a single-tooth tenaculum. Quarter percent Marcaine was injected at the 4 and 8:00 positions of the cervix. The cervix was then sounded to 9 cm. The cervix was dilated to approximately 6 mm. Mysosure operative  hysteroscope was inserted. The findings noted above. Myosure reach  blade was introduced throught the hysteroscope. The endometrial mass was removed in 5 minute.  There was no evidence of perforation.  Hysteroscope was then removed. Sharp curette was inserted and endometrial curettings were obtained. The hysteroscope was reinserted. There was no sign of uterine  perforation. .The hysteroscope was removed.  The single-tooth tenaculum was removed from the anterior lip of the cervix. Patient was noted to have bleeding from the tenaculum site. Silver nitrate was applied and excellent hemostasis was noted. The speculum was removed from the patient's vagina. She was awakened from anesthesia taken care  To the recovery  room awake and in stable condition. Sponge lap and needle counts were correct x2.

## 2017-06-29 NOTE — Anesthesia Postprocedure Evaluation (Signed)
Anesthesia Post Note  Patient: [name] [name]  Procedure(s) Performed: DILATATION & CURETTAGE/HYSTEROSCOPY WITH MYOSURE POLYPECTOMY (N/A Vagina ) INTRAUTERINE DEVICE (IUD) INSERTION (N/A Vagina )     Patient location during evaluation: PACU Anesthesia Type: General Level of consciousness: awake and alert Pain management: pain level controlled Vital Signs Assessment: post-procedure vital signs reviewed and stable Respiratory status: spontaneous breathing, nonlabored ventilation, respiratory function stable and patient connected to nasal cannula oxygen Cardiovascular status: blood pressure returned to baseline and stable Postop Assessment: no apparent nausea or vomiting Anesthetic complications: no    Last Vitals:  Vitals:   06/29/17 1445 06/29/17 1502  BP: (!) 156/80 134/84  Pulse: 74 82  Resp: 17 18  Temp:  37.1 C  SpO2: 96% 95%    Last Pain:  Vitals:   06/29/17 1502  TempSrc: Oral  PainSc:    Pain Goal: Patients Stated Pain Goal: 4 (06/29/17 1131)               Tiajuana Amass

## 2017-06-29 NOTE — Transfer of Care (Signed)
Immediate Anesthesia Transfer of Care Note  Patient: [name] [name]  Procedure(s) Performed: DILATATION & CURETTAGE/HYSTEROSCOPY WITH MYOSURE POLYPECTOMY (N/A Vagina ) INTRAUTERINE DEVICE (IUD) INSERTION (N/A Vagina )  Patient Location: PACU  Anesthesia Type:General  Level of Consciousness: awake, alert  and sedated  Airway & Oxygen Therapy: Patient Spontanous Breathing  Post-op Assessment: Report given to RN  Post vital signs: Reviewed  Last Vitals:  Vitals Value Taken Time  BP    Temp    Pulse    Resp    SpO2      Last Pain:  Vitals:   06/29/17 1502  TempSrc: Oral  PainSc:       Patients Stated Pain Goal: 4 (97/84/78 4128)  Complications: No apparent anesthesia complications

## 2017-06-30 ENCOUNTER — Encounter (HOSPITAL_COMMUNITY): Payer: Self-pay | Admitting: Obstetrics and Gynecology

## 2017-06-30 NOTE — Addendum Note (Signed)
Addendum  created 06/30/17 1122 by Lucretia Kern D, CRNA   Charge Capture section accepted

## 2017-07-08 ENCOUNTER — Inpatient Hospital Stay: Payer: BC Managed Care – PPO

## 2017-07-08 ENCOUNTER — Inpatient Hospital Stay: Payer: BC Managed Care – PPO | Attending: Hematology and Oncology

## 2017-07-08 ENCOUNTER — Inpatient Hospital Stay (HOSPITAL_BASED_OUTPATIENT_CLINIC_OR_DEPARTMENT_OTHER): Payer: BC Managed Care – PPO | Admitting: Hematology and Oncology

## 2017-07-08 ENCOUNTER — Encounter: Payer: Self-pay | Admitting: Hematology and Oncology

## 2017-07-08 ENCOUNTER — Telehealth: Payer: Self-pay | Admitting: Hematology and Oncology

## 2017-07-08 VITALS — BP 155/57 | HR 83 | Resp 18

## 2017-07-08 DIAGNOSIS — D500 Iron deficiency anemia secondary to blood loss (chronic): Secondary | ICD-10-CM | POA: Diagnosis not present

## 2017-07-08 DIAGNOSIS — R5383 Other fatigue: Secondary | ICD-10-CM

## 2017-07-08 DIAGNOSIS — N920 Excessive and frequent menstruation with regular cycle: Secondary | ICD-10-CM

## 2017-07-08 LAB — CBC WITH DIFFERENTIAL/PLATELET
Basophils Absolute: 0.1 10*3/uL (ref 0.0–0.1)
Basophils Relative: 1 %
EOS ABS: 0.2 10*3/uL (ref 0.0–0.5)
Eosinophils Relative: 2 %
HCT: 34.4 % — ABNORMAL LOW (ref 34.8–46.6)
HEMOGLOBIN: 10.8 g/dL — AB (ref 11.6–15.9)
Lymphocytes Relative: 33 %
Lymphs Abs: 2.5 10*3/uL (ref 0.9–3.3)
MCH: 24 pg — ABNORMAL LOW (ref 25.1–34.0)
MCHC: 31.5 g/dL (ref 31.5–36.0)
MCV: 76.2 fL — ABNORMAL LOW (ref 79.5–101.0)
Monocytes Absolute: 0.6 10*3/uL (ref 0.1–0.9)
Monocytes Relative: 9 %
NEUTROS PCT: 55 %
Neutro Abs: 4.1 10*3/uL (ref 1.5–6.5)
Platelets: 338 10*3/uL (ref 145–400)
RBC: 4.52 MIL/uL (ref 3.70–5.45)
RDW: 15.7 % — ABNORMAL HIGH (ref 11.2–14.5)
WBC: 7.5 10*3/uL (ref 3.9–10.3)

## 2017-07-08 LAB — FERRITIN: FERRITIN: 201 ng/mL (ref 9–269)

## 2017-07-08 LAB — IRON AND TIBC
Iron: 62 ug/dL (ref 41–142)
SATURATION RATIOS: 23 % (ref 21–57)
TIBC: 266 ug/dL (ref 236–444)
UIBC: 204 ug/dL

## 2017-07-08 MED ORDER — SODIUM CHLORIDE 0.9 % IV SOLN
510.0000 mg | INTRAVENOUS | Status: DC
  Administered 2017-07-08: 510 mg via INTRAVENOUS
  Filled 2017-07-08: qty 17

## 2017-07-08 MED ORDER — METHYLPREDNISOLONE SODIUM SUCC 40 MG IJ SOLR
30.0000 mg | Freq: Once | INTRAMUSCULAR | Status: AC
  Administered 2017-07-08: 30 mg via INTRAVENOUS

## 2017-07-08 MED ORDER — METHYLPREDNISOLONE SODIUM SUCC 40 MG IJ SOLR
INTRAMUSCULAR | Status: AC
  Filled 2017-07-08: qty 1

## 2017-07-08 NOTE — Patient Instructions (Signed)

## 2017-07-08 NOTE — Assessment & Plan Note (Signed)
She complained of excessive fatigue We will proceed with iron treatment today without waiting for serum ferritin She still has persistent microcytic anemia

## 2017-07-08 NOTE — Telephone Encounter (Signed)
Patient declined avs and calendar of upcoming July appointments

## 2017-07-08 NOTE — Assessment & Plan Note (Signed)
The most likely cause of her anemia is due to chronic blood loss/malabsorption syndrome. We discussed some of the risks, benefits, and alternatives of intravenous iron infusions. The patient is symptomatic from anemia and the iron level is critically low. She tolerated oral iron supplement poorly and desires to achieved higher levels of iron faster for adequate hematopoesis. Some of the side-effects to be expected including risks of infusion reactions, phlebitis, headaches, nausea and fatigue.  The patient is willing to proceed. Patient education material was dispensed.  Goal is to keep ferritin level greater than 50 We will proceed with treatment today without waiting for ferritin level I recommend reducing premedication steroids because she has trouble sleeping I will call her with serum ferritin results next week and to consider canceling her treatment next week if her serum ferritin is adequate I recommend repeat blood count again in 2 months

## 2017-07-08 NOTE — Progress Notes (Signed)
[facility] OFFICE PROGRESS NOTE  [name], [name], MD  ASSESSMENT & PLAN:  Iron deficiency anemia The most likely cause of her anemia is due to chronic blood loss/malabsorption syndrome. We discussed some of the risks, benefits, and alternatives of intravenous iron infusions. The patient is symptomatic from anemia and the iron level is critically low. She tolerated oral iron supplement poorly and desires to achieved higher levels of iron faster for adequate hematopoesis. Some of the side-effects to be expected including risks of infusion reactions, phlebitis, headaches, nausea and fatigue.  The patient is willing to proceed. Patient education material was dispensed.  Goal is to keep ferritin level greater than 50 We will proceed with treatment today without waiting for ferritin level I recommend reducing premedication steroids because she has trouble sleeping I will call her with serum ferritin results next week and to consider canceling her treatment next week if her serum ferritin is adequate I recommend repeat blood count again in 2 months  Fatigue She complained of excessive fatigue We will proceed with iron treatment today without waiting for serum ferritin She still has persistent microcytic anemia   No orders of the defined types were placed in this encounter.   INTERVAL HISTORY: [name] [name] 42 y.o. female returns for further follow-up She had recent D&C Biopsy from polypectomy was benign She denies menstruation since then She complained of excessive fatigue The patient denies any recent signs or symptoms of bleeding such as spontaneous epistaxis, hematuria or hematochezia. She tolerated recent intravenous iron well without complications   SUMMARY OF HEMATOLOGIC HISTORY:  She was found to have abnormal CBC from recent blood work for evaluation of fatigue. She denies recent chest pain on exertion, pre-syncopal episodes, or palpitations. She does  complain of leg cramps, dizziness, shortness of breath on minimal exertion, and frequent headaches. Recent CBC done last month in May 2015 shows significant anemia with hemoglobin 7.9, MCV of 55 and platelet count of 409. She received one dose of intravenous iron on 07/23/2013. After infusion, she complained of scratchy throat and developed hives. Subsequently, she received premedication with Solu-Medrol and she was able to complete her treatment in July 2015 She received further IV iron in June 2017 for recurrent iron deficiency anemia In February 2019, EGD and colonoscopy excluded GI blood loss On 06/29/2017, she underwent D&C and polypectomy  I have reviewed the past medical history, past surgical history, social history and family history with the patient and they are unchanged from previous note.  ALLERGIES:  is allergic to feraheme [ferumoxytol].  MEDICATIONS:  Current Outpatient Medications  Medication Sig Dispense Refill  . diphenhydrAMINE (BENADRYL) 25 MG tablet Take 25 mg by mouth at bedtime as needed for allergies.    Marland Kitchen ibuprofen (ADVIL,MOTRIN) 800 MG tablet Take 1 tablet (800 mg total) by mouth every 8 (eight) hours as needed. 30 tablet 0  . oxyCODONE (OXY IR/ROXICODONE) 5 MG immediate release tablet Take 1 tablet (5 mg total) by mouth every 6 (six) hours as needed for severe pain. 10 tablet 0  . rizatriptan (MAXALT-MLT) 10 MG disintegrating tablet Take 10 mg by mouth as needed for migraine. May repeat in 2 hours if needed    . SUMAtriptan (IMITREX) 100 MG tablet Take 1 tablet at earliest onset of headache.  May repeat once in 2 hours if headache persists or recurs. (Patient not taking: Reported on 06/23/2017) 10 tablet 2  . Vitamin D, Ergocalciferol, (DRISDOL) 50000 units CAPS capsule Take 50,000 Units by  mouth every 7 (seven) days.     No current facility-administered medications for this visit.      REVIEW OF SYSTEMS:   Constitutional: Denies fevers, chills or night  sweats Eyes: Denies blurriness of vision Ears, nose, mouth, throat, and face: Denies mucositis or sore throat Respiratory: Denies cough, dyspnea or wheezes Cardiovascular: Denies palpitation, chest discomfort or lower extremity swelling Gastrointestinal:  Denies nausea, heartburn or change in bowel habits Skin: Denies abnormal skin rashes Lymphatics: Denies new lymphadenopathy or easy bruising Neurological:Denies numbness, tingling or new weaknesses Behavioral/Psych: Mood is stable, no new changes  All other systems were reviewed with the patient and are negative.  PHYSICAL EXAMINATION: ECOG PERFORMANCE STATUS: 1 - Symptomatic but completely ambulatory  Vitals:   07/08/17 1451  BP: (!) 153/62  Pulse: 88  Resp: 18  Temp: 98.2 F (36.8 C)  SpO2: 99%   Filed Weights   07/08/17 1451  Weight: 276 lb 1.6 oz (125.2 kg)    GENERAL:alert, no distress and comfortable SKIN: skin color, texture, turgor are normal, no rashes or significant lesions NEURO: alert & oriented x 3 with fluent speech, no focal motor/sensory deficits  LABORATORY DATA:  I have reviewed the data as listed     Component Value Date/Time   NA 139 02/11/2017 1000   K 3.4 (L) 02/11/2017 1000   CL 103 02/28/2012 1607   CO2 26 02/11/2017 1000   GLUCOSE 108 02/11/2017 1000   BUN 12.0 02/11/2017 1000   CREATININE 0.7 02/11/2017 1000   CALCIUM 9.1 02/11/2017 1000   PROT 7.3 02/11/2017 1000   ALBUMIN 3.5 02/11/2017 1000   AST 15 02/11/2017 1000   ALT 15 02/11/2017 1000   ALKPHOS 48 02/11/2017 1000   BILITOT <0.22 02/11/2017 1000    No results found for: SPEP, UPEP  Lab Results  Component Value Date   WBC 7.5 07/08/2017   NEUTROABS 4.1 07/08/2017   HGB 10.8 (L) 07/08/2017   HCT 34.4 (L) 07/08/2017   MCV 76.2 (L) 07/08/2017   PLT 338 07/08/2017      Chemistry      Component Value Date/Time   NA 139 02/11/2017 1000   K 3.4 (L) 02/11/2017 1000   CL 103 02/28/2012 1607   CO2 26 02/11/2017 1000   BUN  12.0 02/11/2017 1000   CREATININE 0.7 02/11/2017 1000      Component Value Date/Time   CALCIUM 9.1 02/11/2017 1000   ALKPHOS 48 02/11/2017 1000   AST 15 02/11/2017 1000   ALT 15 02/11/2017 1000   BILITOT <0.22 02/11/2017 1000       I spent 10 minutes counseling the patient face to face. The total time spent in the appointment was 15 minutes and more than 50% was on counseling.   All questions were answered. The patient knows to call the clinic with any problems, questions or concerns. No barriers to learning was detected.    [name], MD 5/31/20193:16 PM

## 2017-07-11 ENCOUNTER — Telehealth: Payer: Self-pay | Admitting: *Deleted

## 2017-07-11 NOTE — Telephone Encounter (Signed)
LM to call Dr Gorsuch's nurse 

## 2017-07-11 NOTE — Telephone Encounter (Signed)
-----   Message from [name], MD sent at 07/11/2017  9:23 AM EDT ----- Regarding: labs ok Her iron studies are good I cancelled her IV iron infusion this FRiday Recheck labs as scheduled ----- Message ----- From: Buel Ream, Lab In [place] Sent: 07/08/2017   2:47 PM To: [name], MD

## 2017-07-12 NOTE — Telephone Encounter (Signed)
Notified of message below

## 2017-07-15 ENCOUNTER — Ambulatory Visit: Payer: BC Managed Care – PPO

## 2017-09-02 ENCOUNTER — Inpatient Hospital Stay: Payer: BC Managed Care – PPO | Attending: Hematology and Oncology

## 2017-09-02 DIAGNOSIS — D500 Iron deficiency anemia secondary to blood loss (chronic): Secondary | ICD-10-CM | POA: Insufficient documentation

## 2017-09-02 DIAGNOSIS — N920 Excessive and frequent menstruation with regular cycle: Secondary | ICD-10-CM

## 2017-09-02 LAB — CBC WITH DIFFERENTIAL/PLATELET
BASOS PCT: 0 %
Basophils Absolute: 0 10*3/uL (ref 0.0–0.1)
EOS ABS: 0.2 10*3/uL (ref 0.0–0.5)
Eosinophils Relative: 2 %
HCT: 35 % (ref 34.8–46.6)
HEMOGLOBIN: 10.9 g/dL — AB (ref 11.6–15.9)
LYMPHS ABS: 2.3 10*3/uL (ref 0.9–3.3)
Lymphocytes Relative: 34 %
MCH: 24 pg — AB (ref 25.1–34.0)
MCHC: 31.1 g/dL — AB (ref 31.5–36.0)
MCV: 76.9 fL — ABNORMAL LOW (ref 79.5–101.0)
MONOS PCT: 7 %
Monocytes Absolute: 0.5 10*3/uL (ref 0.1–0.9)
NEUTROS ABS: 3.9 10*3/uL (ref 1.5–6.5)
NEUTROS PCT: 57 %
Platelets: 295 10*3/uL (ref 145–400)
RBC: 4.55 MIL/uL (ref 3.70–5.45)
RDW: 16.1 % — ABNORMAL HIGH (ref 11.2–14.5)
WBC: 6.9 10*3/uL (ref 3.9–10.3)

## 2017-09-02 LAB — IRON AND TIBC
Iron: 82 ug/dL (ref 41–142)
Saturation Ratios: 32 % (ref 21–57)
TIBC: 260 ug/dL (ref 236–444)
UIBC: 178 ug/dL

## 2017-09-02 LAB — FERRITIN: FERRITIN: 221 ng/mL (ref 11–307)

## 2017-09-05 ENCOUNTER — Telehealth: Payer: Self-pay

## 2017-09-05 NOTE — Telephone Encounter (Signed)
Called and given below message. She verbalized understanding. She will follow up with her PCP for labs in 3 months.

## 2017-09-05 NOTE — Telephone Encounter (Signed)
-----   Message from [name], MD sent at 09/05/2017  9:07 AM EDT ----- Regarding: iron level ok Her iron level is OK No need iron Does she has a primary doctor to follow on her iron test? Recommend recheck in 3 months If not, send scheduling msg for labs and see me in 3 months

## 2018-04-01 ENCOUNTER — Other Ambulatory Visit: Payer: Self-pay

## 2018-04-01 ENCOUNTER — Ambulatory Visit
Admission: EM | Admit: 2018-04-01 | Discharge: 2018-04-01 | Disposition: A | Discharge status: Home / Self Care | Payer: BC Managed Care – PPO | Attending: Family Medicine | Admitting: Family Medicine

## 2018-04-01 DIAGNOSIS — G43801 Other migraine, not intractable, with status migrainosus: Secondary | ICD-10-CM

## 2018-04-01 DIAGNOSIS — R51 Headache: Secondary | ICD-10-CM | POA: Diagnosis not present

## 2018-04-01 DIAGNOSIS — R030 Elevated blood-pressure reading, without diagnosis of hypertension: Secondary | ICD-10-CM | POA: Diagnosis not present

## 2018-04-01 MED ORDER — PROPRANOLOL HCL 20 MG PO TABS: 20.0000 mg | ORAL_TABLET | Freq: Three times a day (TID) | ORAL | 0 refills | Status: DC

## 2018-04-01 MED ORDER — DEXAMETHASONE SODIUM PHOSPHATE 10 MG/ML IJ SOLN
10.0000 mg | Freq: Once | INTRAMUSCULAR | Status: AC
  Administered 2018-04-01: 10 mg via INTRAMUSCULAR

## 2018-04-01 MED ORDER — KETOROLAC TROMETHAMINE 60 MG/2ML IM SOLN
60.0000 mg | Freq: Once | INTRAMUSCULAR | Status: AC
  Administered 2018-04-01: 60 mg via INTRAMUSCULAR

## 2018-04-01 NOTE — Discharge Instructions (Signed)
If headache worsens,go immediately to the emergency department for further evaluation

## 2018-04-01 NOTE — ED Triage Notes (Signed)
Per pt has been having headache for about 1 week with nausea and neck pain. Pt has hx of migraines. No vision changes or weakness

## 2018-04-01 NOTE — ED Provider Notes (Signed)
[facility]    CSN: [id number] Arrival date & time: 04/01/18  1438  History   Chief Complaint Chief Complaint  Patient presents with  . Headache    HPI [name] [name] is a 43 y.o. female.   HPI  Headache: She complains of a of headache. She does have a headache at this time. Current headaches has waxed and waned for over 1 week. No truly completely gone totally away during this period of time Description of Headaches: Location of pain: right-sided unilateral, retro-orbital Radiation of pain?:none Character of pain:aching and throbbing Severity of pain: 7 Accompanying symptoms: nausea, photophobia Rapidity of onset: gradual Are most headaches similar in presentation? yes Typical precipitants: stress and lack of sleep    Blood pressure elevated without diagnosis of hypertension. Reports that she has been told in the past BP was elevated. Currently not prescribed medication for hypertension management. No home monitoring of blood pressure. No swelling, shortness of breath, dizziness, or new weakness.   Past Medical History:  Diagnosis Date  . Anemia   . Headache(784.0)   . PCOS (polycystic ovarian syndrome)     Patient Active Problem List   Diagnosis Date Noted  . Menorrhagia with regular cycle 02/11/2017  . Microcytic anemia 09/10/2015  . Iron deficiency anemia 07/19/2013  . Thrombocytopenia, unspecified ([place]) 07/19/2013  . Headache 07/19/2013  . Fatigue 02/28/2012  . Unspecified osteomyelitis, ankle and foot 10/26/2011  . Other complications due to other internal orthopedic device, implant, and graft 10/25/2011    Past Surgical History:  Procedure Laterality Date  . BUNIONECTOMY    . BUNIONECTOMY  05/22/11   right  . DILATATION & CURETTAGE/HYSTEROSCOPY WITH MYOSURE N/A 06/29/2017   Procedure: DILATATION & CURETTAGE/HYSTEROSCOPY WITH MYOSURE POLYPECTOMY;  Surgeon: Christophe Louis, MD;  Location: [facility];  Service: Gynecology;  Laterality: N/A;  .  INTRAUTERINE DEVICE (IUD) INSERTION N/A 06/29/2017   Procedure: INTRAUTERINE DEVICE (IUD) INSERTION;  Surgeon: Christophe Louis, MD;  Location: [facility];  Service: Gynecology;  Laterality: N/A;  Mirena Placement  . LAPAROSCOPIC OVARIAN CYSTECTOMY    . LYMPH GLAND EXCISION      OB History    Gravida  3   Para  2   Term  1   Preterm  1   AB  1   Living  2     SAB  1   TAB      Ectopic      Multiple      Live Births  1            Home Medications    Prior to Admission medications   Medication Sig Start Date End Date Taking? Authorizing Provider  diphenhydrAMINE (BENADRYL) 25 MG tablet Take 25 mg by mouth at bedtime as needed for allergies.    [provider]  ibuprofen (ADVIL,MOTRIN) 800 MG tablet Take 1 tablet (800 mg total) by mouth every 8 (eight) hours as needed. 06/29/17   Christophe Louis, MD  oxyCODONE (OXY IR/ROXICODONE) 5 MG immediate release tablet Take 1 tablet (5 mg total) by mouth every 6 (six) hours as needed for severe pain. 06/29/17   Christophe Louis, MD  rizatriptan (MAXALT-MLT) 10 MG disintegrating tablet Take 10 mg by mouth as needed for migraine. May repeat in 2 hours if needed    [provider]  SUMAtriptan (IMITREX) 100 MG tablet Take 1 tablet at earliest onset of headache.  May repeat once in 2 hours if headache persists or recurs. Patient not taking: Reported  on 06/23/2017 01/28/16   Pieter Partridge, DO  Vitamin D, Ergocalciferol, (DRISDOL) 50000 units CAPS capsule Take 50,000 Units by mouth every 7 (seven) days. 05/10/17   [provider]    Family History No family history on file.  Social History Social History   Tobacco Use  . Smoking status: Never Smoker  . Smokeless tobacco: Never Used  Substance Use Topics  . Alcohol use: Yes    Comment: OCASSIONALLY  . Drug use: No     Allergies   Feraheme [ferumoxytol]   Review of Systems Review of Systems Pertinent negatives listed in HPI  Physical Exam Triage Vital Signs ED  Triage Vitals  Enc Vitals Group     BP 04/01/18 1456 (!) 165/81     Pulse Rate 04/01/18 1456 84     Resp --      Temp 04/01/18 1456 (!) 97.4 F (36.3 C)     Temp Source 04/01/18 1456 Oral     SpO2 --      Weight 04/01/18 1452 270 lb (122.5 kg)     Height 04/01/18 1452 5\' 4"  (1.626 m)     Head Circumference --      Peak Flow --      Pain Score 04/01/18 1451 7     Pain Loc --      Pain Edu? --      Excl. in [place]? --    No data found.  Updated Vital Signs BP (!) 165/81 (BP Location: Right Arm)   Pulse 84   Temp (!) 97.4 F (36.3 C) (Oral)   Ht 5\' 4"  (1.626 m)   Wt 270 lb (122.5 kg)   LMP 03/27/2018 (Exact Date)   BMI 46.35 kg/m   Visual Acuity Right Eye Distance:   Left Eye Distance:   Bilateral Distance:    Right Eye Near:   Left Eye Near:    Bilateral Near:     Physical Exam General appearance: alert, well developed, well nourished, cooperative and in no distress Head: Normocephalic, without obvious abnormality, atraumatic Respiratory: Respirations even and unlabored, normal respiratory rate Heart: Normal rate and rhythm. No gallops or murmurs Extremities: No gross deformities Skin: Skin color, texture, turgor normal. No rashes seen  Psych: Appropriate mood and affect. Neurologic: Mental status: Alert, oriented to person, place, and time, thought content appropriate. Gait intact. Negative Romberg  UC Treatments / Results  Labs (all labs ordered are listed, but only abnormal results are displayed) Labs Reviewed - No data to display  EKG None  Radiology No results found.  Procedures Procedures (including critical care time)  Medications Ordered in UC Medications  ketorolac (TORADOL) injection 60 mg (60 mg Intramuscular Given 04/01/18 1527)  dexamethasone (DECADRON) injection 10 mg (10 mg Intramuscular Given 04/01/18 1527)    Initial Impression / Assessment and Plan / UC Course  I have reviewed the triage vital signs and the nursing notes.  Pertinent  labs & imaging results that were available during my care of the patient were reviewed by me and considered in my medical decision making (see chart for details).    Patient presents with a headache unrelieved with chronic migraine medications. Headache cocktail administered with reported improvement in headache pain. Patient counseled extensively regarding high BP and that readings warrant further evaluation. Will start patient on propranolol TID for headache and BP management. Patient given no refills. Advised to follow-up with PCP for further evaluation.  Final Clinical Impressions(s) / UC Diagnoses   Final diagnoses:  Elevated BP without diagnosis of hypertension  Other migraine with status migrainosus, not intractable     Discharge Instructions     If headache worsens,go immediately to the emergency department for further evaluation     ED Prescriptions    Medication Sig Dispense Auth. Provider   propranolol (INDERAL) 20 MG tablet Take 1 tablet (20 mg total) by mouth 3 (three) times daily. 90 tablet Scot Jun, FNP     Controlled Substance Prescriptions [place] Controlled Substance Registry consulted? Not Applicable   Scot Jun, FNP 04/05/18 2149

## 2018-04-08 IMAGING — CT CT HEAD W/O CM
3 of 4 series · 17 of 47 positions shown, 20 images · non-contrast
Comparison: None.

CLINICAL DATA: Migraines for 20 years.

EXAM:
CT HEAD WITHOUT CONTRAST
TECHNIQUE: Contiguous axial images were obtained from the base of the skull
through the vertex without intravenous contrast.

[Series 32: 3d filtered head w/o · axial · non-contrast · 0.49mm/px · z∈[-17,+118]mm · 11 of 33 slices shown, 14 images]
[im 3/33  brain]
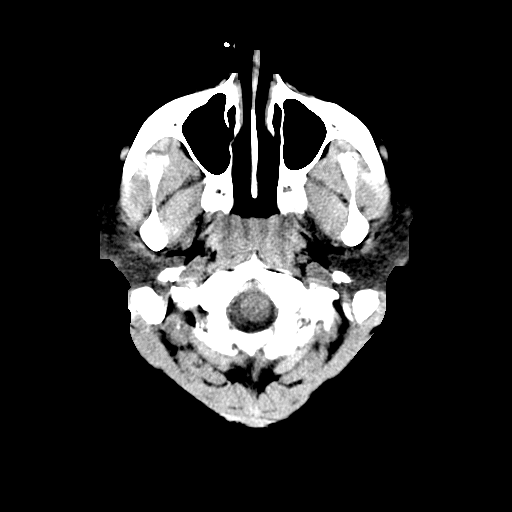
[im 3/33  bone]
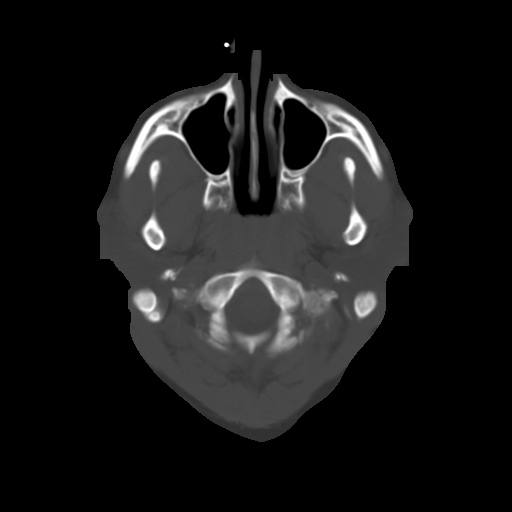
[im 5/33  brain]
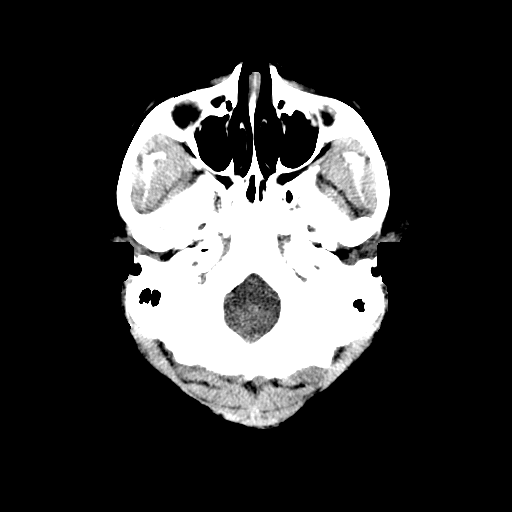
[im 7/33  brain]
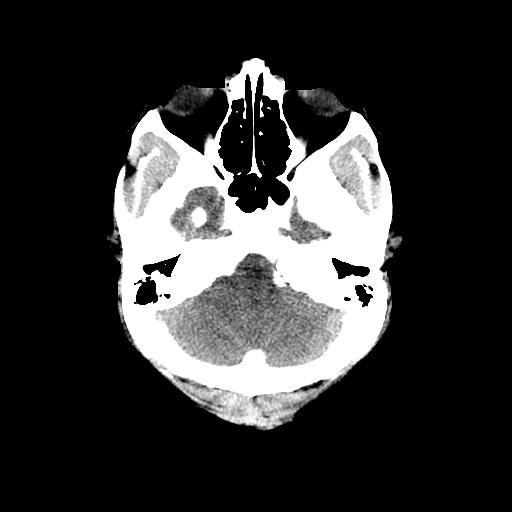
[im 12/33  brain]
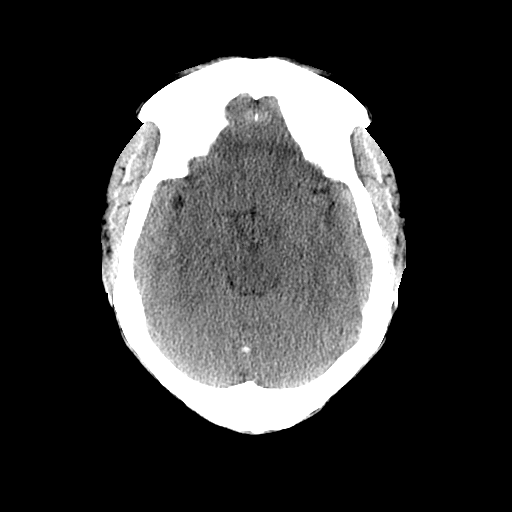
[im 14/33  brain]
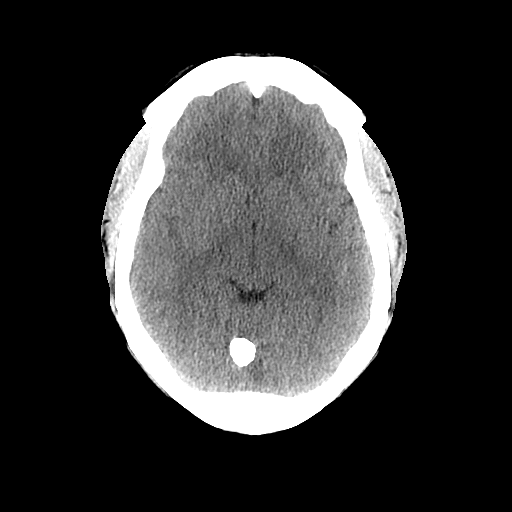
[im 14/33  bone]
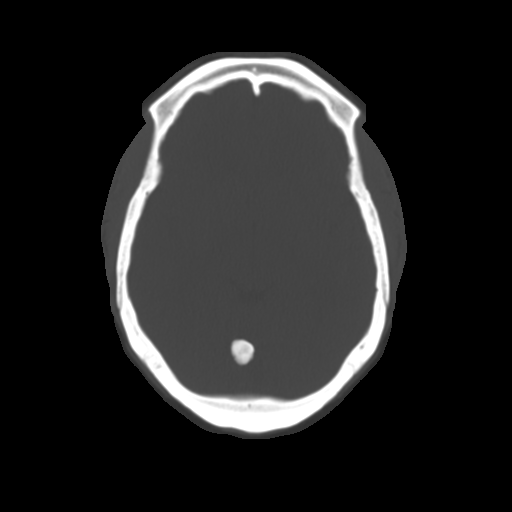
[im 17/33  brain]
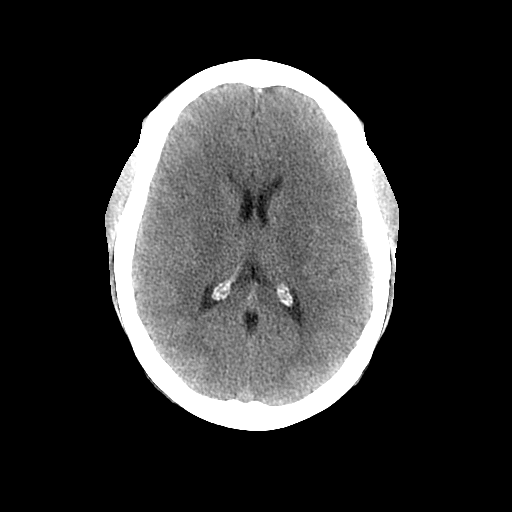
[im 19/33  brain]
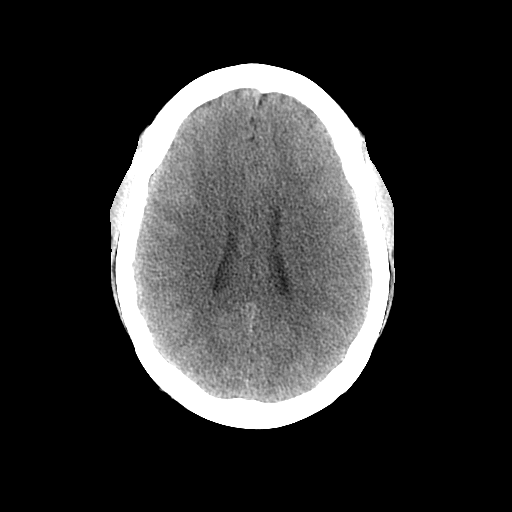
[im 21/33  brain]
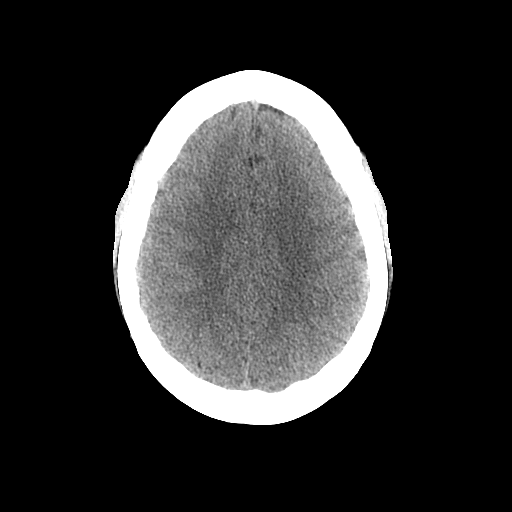
[im 26/33  brain]
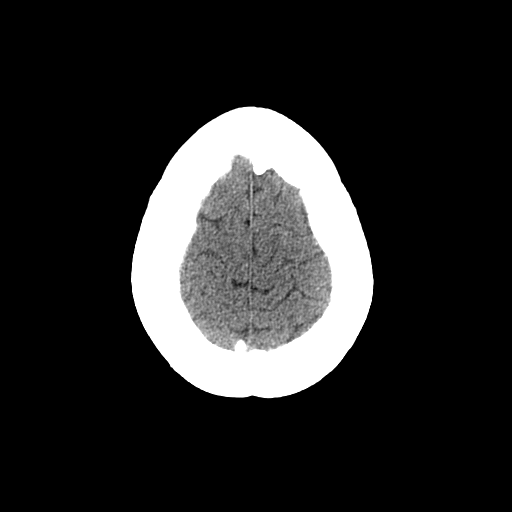
[im 26/33  bone]
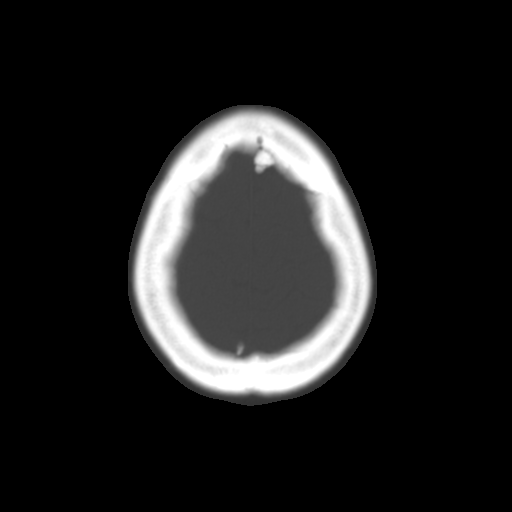
[im 28/33  brain]
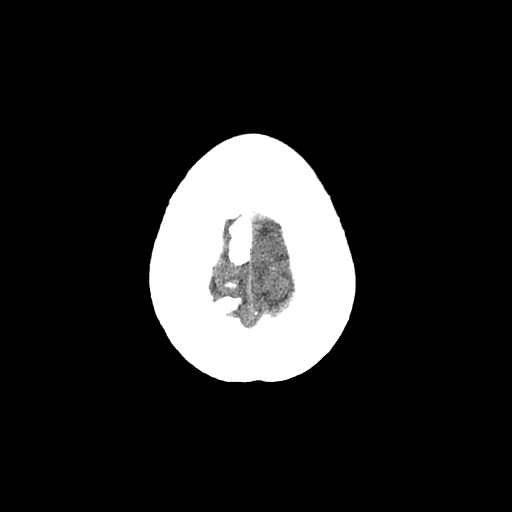
[im 30/33  brain]
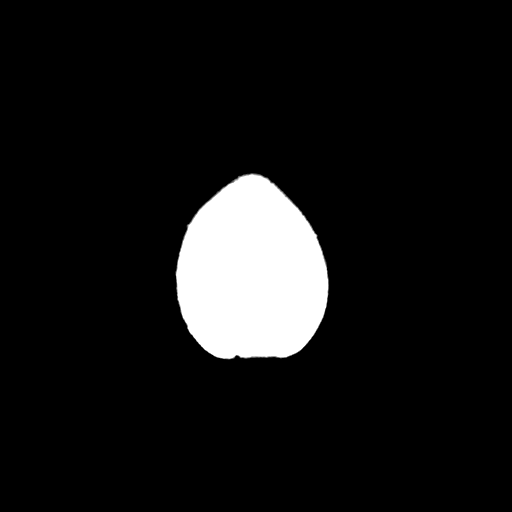

[Series 601: coronal brain · coronal · 0.49mm/px · 3 of 73 slices shown]
[im 25/73  brain]
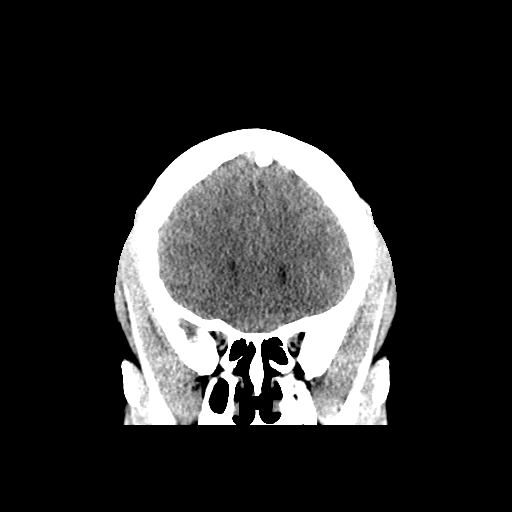
[im 33/73  brain]
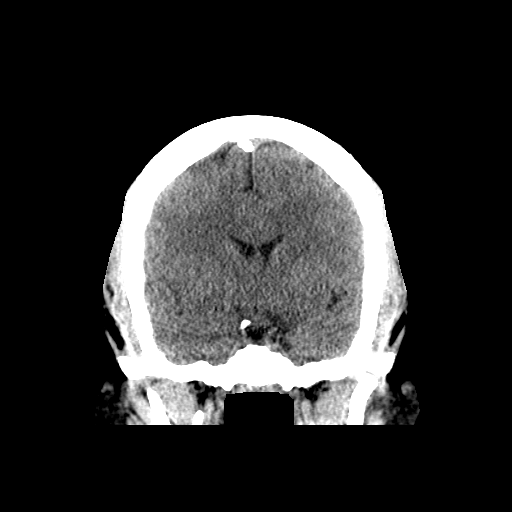
[im 41/73  brain]
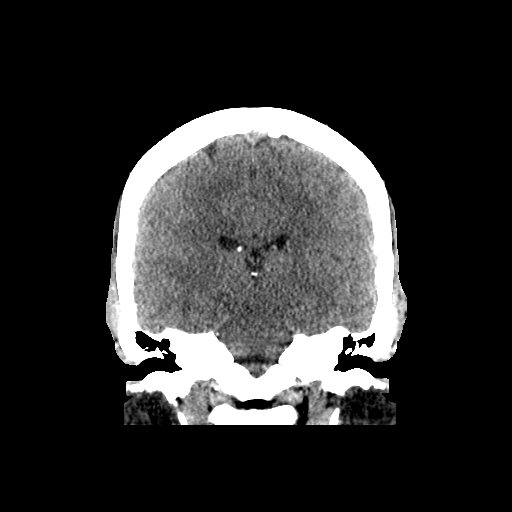

[Series 602: sagittal brain · sagittal · 0.49mm/px · 3 of 62 slices shown]
[im 21/62  brain]
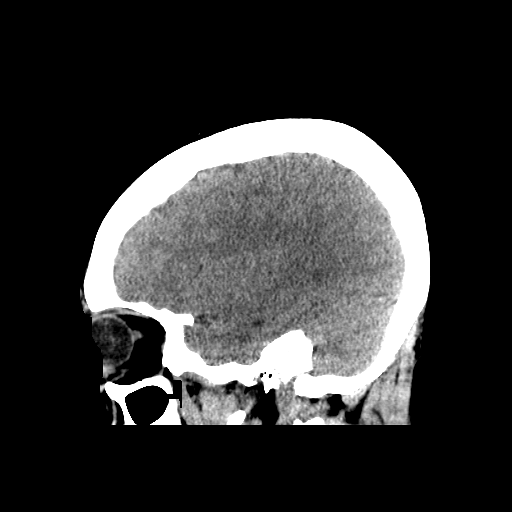
[im 31/62  brain]
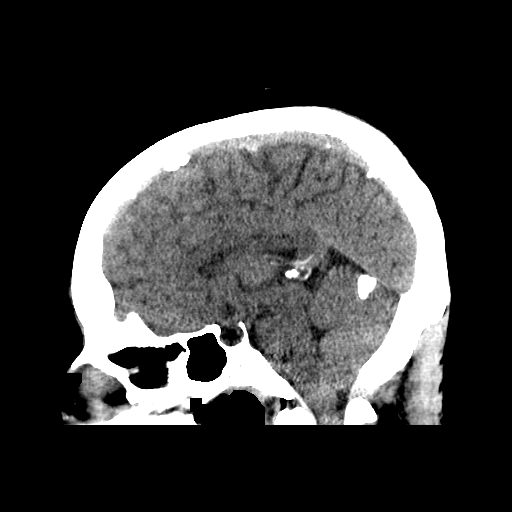
[im 41/62  brain]
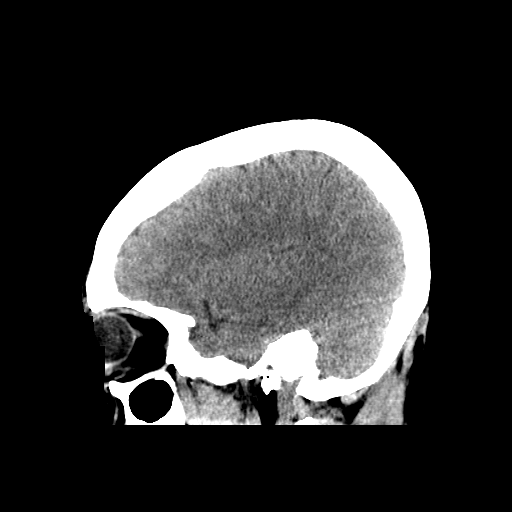

[17 of 47 positions shown; findings below may reference images not displayed]

FINDINGS: No acute intracranial abnormality. Specifically, no hemorrhage,
hydrocephalus, mass lesion, acute infarction, or significant
intracranial injury. No acute calvarial abnormality. Dural
calcifications noted along the falx and tentorium. Visualized
paranasal sinuses and mastoids clear. Orbital soft tissues
unremarkable.
IMPRESSION: No acute intracranial abnormality.

## 2018-05-04 ENCOUNTER — Other Ambulatory Visit: Payer: Self-pay | Admitting: Family Medicine

## 2018-05-28 ENCOUNTER — Other Ambulatory Visit: Payer: Self-pay | Admitting: Family Medicine

## 2018-07-01 ENCOUNTER — Other Ambulatory Visit: Payer: Self-pay | Admitting: Family Medicine

## 2018-07-01 NOTE — Telephone Encounter (Signed)
Patient was advised to f/u with PCP for RF per last RF- sent for review of request

## 2019-04-26 ENCOUNTER — Ambulatory Visit: Payer: BC Managed Care – PPO | Attending: Family

## 2019-04-26 DIAGNOSIS — Z23 Encounter for immunization: Secondary | ICD-10-CM

## 2019-04-26 NOTE — Progress Notes (Signed)
   Covid-19 Vaccination Clinic  Name:  [name] [name]    MRN: [id number] DOB: [birth year]  04/26/2019  [name] was observed post Covid-19 immunization for 15 minutes without incident. She was provided with Vaccine Information Sheet and instruction to access the V-Safe system.   [name] was instructed to call 911 with any severe reactions post vaccine: Marland Kitchen Difficulty breathing  . Swelling of face and throat  . A fast heartbeat  . A bad rash all over body  . Dizziness and weakness   Immunizations Administered    Name Date Dose VIS Date Route   Moderna COVID-19 Vaccine 04/26/2019  1:57 PM 0.5 mL 01/09/2019 Intramuscular   Manufacturer: Moderna   Lot: [id number]   [id number]

## 2019-05-19 ENCOUNTER — Encounter: Payer: Self-pay | Admitting: Emergency Medicine

## 2019-05-19 ENCOUNTER — Ambulatory Visit
Admission: EM | Admit: 2019-05-19 | Discharge: 2019-05-19 | Disposition: A | Discharge status: Home / Self Care | Payer: BC Managed Care – PPO | Attending: Internal Medicine | Admitting: Internal Medicine

## 2019-05-19 ENCOUNTER — Other Ambulatory Visit: Payer: Self-pay

## 2019-05-19 DIAGNOSIS — R1032 Left lower quadrant pain: Secondary | ICD-10-CM | POA: Diagnosis not present

## 2019-05-19 LAB — POCT URINALYSIS DIP (MANUAL ENTRY)
Bilirubin, UA: NEGATIVE
Blood, UA: NEGATIVE
Glucose, UA: NEGATIVE mg/dL
Ketones, POC UA: NEGATIVE mg/dL
Leukocytes, UA: NEGATIVE
Nitrite, UA: NEGATIVE
Protein Ur, POC: NEGATIVE mg/dL
Spec Grav, UA: >=1.03 — AB (ref 1.010–1.025)
Urobilinogen, UA: 0.2 E.U./dL
pH, UA: 6 (ref 5.0–8.0)

## 2019-05-19 NOTE — Discharge Instructions (Addendum)
Keep track of symptoms, drink plenty of water, and follow-up with primary care for persistent symptoms. Return for worsening pain, blood in urine, abdominal or back pain, fever.

## 2019-05-19 NOTE — ED Triage Notes (Signed)
Noticed urine was discolored, noticed yesterday.  Reports color as being pinkish.  Left abdominal pain intermittently for one week

## 2019-05-19 NOTE — ED Provider Notes (Signed)
[facility]    CSN: [id number] Arrival date & time: 05/19/19  1311      History   Chief Complaint Chief Complaint  Patient presents with  . Urinary Tract Infection    HPI [name] [name] is a 44 y.o. female presenting for concern of UTI.  States she noticed her urine discolored yesterday: Pinkish color.  Denying vaginal bleeding, discharge, pain, pelvic or back pain.  Does endorse chronic intermittent LLQ pain which she describes as crampy, gas-like.  No change in stool, hematochezia, melena.  Patient not currently sexually active: Last coitus 2 years ago.  LMP 05/04/2019.  Denies urinary urgency, frequency, polydipsia, polyuria, polyphasia.   Past Medical History:  Diagnosis Date  . Anemia   . Headache(784.0)   . PCOS (polycystic ovarian syndrome)     Patient Active Problem List   Diagnosis Date Noted  . Menorrhagia with regular cycle 02/11/2017  . Microcytic anemia 09/10/2015  . Iron deficiency anemia 07/19/2013  . Thrombocytopenia, unspecified ([place]) 07/19/2013  . Headache 07/19/2013  . Fatigue 02/28/2012  . Unspecified osteomyelitis, ankle and foot 10/26/2011  . Other complications due to other internal orthopedic device, implant, and graft 10/25/2011    Past Surgical History:  Procedure Laterality Date  . BUNIONECTOMY    . BUNIONECTOMY  05/22/11   right  . DILATATION & CURETTAGE/HYSTEROSCOPY WITH MYOSURE N/A 06/29/2017   Procedure: DILATATION & CURETTAGE/HYSTEROSCOPY WITH MYOSURE POLYPECTOMY;  Surgeon: Christophe Louis, MD;  Location: [facility];  Service: Gynecology;  Laterality: N/A;  . INTRAUTERINE DEVICE (IUD) INSERTION N/A 06/29/2017   Procedure: INTRAUTERINE DEVICE (IUD) INSERTION;  Surgeon: Christophe Louis, MD;  Location: [facility];  Service: Gynecology;  Laterality: N/A;  Mirena Placement  . LAPAROSCOPIC OVARIAN CYSTECTOMY    . LYMPH GLAND EXCISION      OB History    Gravida  3   Para  2   Term  1   Preterm  1   AB  1   Living  2     SAB  1   TAB      Ectopic      Multiple      Live Births  1            Home Medications    Prior to Admission medications   Medication Sig Start Date End Date Taking? Authorizing Provider  diphenhydrAMINE (BENADRYL) 25 MG tablet Take 25 mg by mouth at bedtime as needed for allergies.    [provider]  ibuprofen (ADVIL,MOTRIN) 800 MG tablet Take 1 tablet (800 mg total) by mouth every 8 (eight) hours as needed. 06/29/17   Christophe Louis, MD  SUMAtriptan (IMITREX) 100 MG tablet Take 1 tablet at earliest onset of headache.  May repeat once in 2 hours if headache persists or recurs. Patient not taking: Reported on 06/23/2017 01/28/16   Pieter Partridge, DO  propranolol (INDERAL) 20 MG tablet TAKE 1 TABLET BY MOUTH THREE TIMES A DAY 05/04/18 05/19/19  Scot Jun, FNP  rizatriptan (MAXALT-MLT) 10 MG disintegrating tablet Take 10 mg by mouth as needed for migraine. May repeat in 2 hours if needed  05/19/19  [provider]    Family History Family History  Problem Relation Age of Onset  . Hypertension Mother     Social History Social History   Tobacco Use  . Smoking status: Never Smoker  . Smokeless tobacco: Never Used  Substance Use Topics  . Alcohol use: Yes    Comment: OCASSIONALLY  .  Drug use: No     Allergies   Feraheme [ferumoxytol]   Review of Systems As per HPI   Physical Exam Triage Vital Signs ED Triage Vitals  Enc Vitals Group     BP 05/19/19 1426 (!) 159/81     Pulse Rate 05/19/19 1426 94     Resp 05/19/19 1426 20     Temp 05/19/19 1426 98.1 F (36.7 C)     Temp Source 05/19/19 1426 Oral     SpO2 05/19/19 1426 99 %     Weight --      Height --      Head Circumference --      Peak Flow --      Pain Score 05/19/19 1441 3     Pain Loc --      Pain Edu? --      Excl. in [place]? --    No data found.  Updated Vital Signs BP (!) 159/81 (BP Location: Left Arm)   Pulse 94   Temp 98.1 F (36.7 C) (Oral)   Resp 20   LMP 05/04/2019   SpO2  99%   Visual Acuity Right Eye Distance:   Left Eye Distance:   Bilateral Distance:    Right Eye Near:   Left Eye Near:    Bilateral Near:     Physical Exam Constitutional:      General: She is not in acute distress. HENT:     Head: Normocephalic and atraumatic.  Eyes:     General: No scleral icterus.    Pupils: Pupils are equal, round, and reactive to light.  Cardiovascular:     Rate and Rhythm: Normal rate.  Pulmonary:     Effort: Pulmonary effort is normal.  Abdominal:     General: Bowel sounds are normal.     Palpations: Abdomen is soft.     Tenderness: There is no abdominal tenderness. There is no right CVA tenderness, left CVA tenderness or guarding.  Skin:    Coloration: Skin is not jaundiced or pale.  Neurological:     Mental Status: She is alert and oriented to person, place, and time.      UC Treatments / Results  Labs (all labs ordered are listed, but only abnormal results are displayed) Labs Reviewed  POCT URINALYSIS DIP (MANUAL ENTRY) - Abnormal; Notable for the following components:      Result Value   Color, UA orange (*)    Spec Grav, UA >=1.030 (*)    All other components within normal limits    EKG   Radiology No results found.  Procedures Procedures (including critical care time)  Medications Ordered in UC Medications - No data to display  Initial Impression / Assessment and Plan / UC Course  I have reviewed the triage vital signs and the nursing notes.  Pertinent labs & imaging results that were available during my care of the patient were reviewed by me and considered in my medical decision making (see chart for details).     Patient afebrile, nontoxic in office today.  Urine dipstick significant for orange color and elevated specific gravity.  No signs of infectious process, low concern for nephropathy.  Will push fluids, trial NSAID/Tylenol for LLQ tenderness.  Patient also gets relief from heating pads: Encouraged her to continue  doing so.  Patient to follow-up with primary care for persistent symptoms.  Return precautions discussed, patient verbalized understanding and is agreeable to plan. Final Clinical Impressions(s) / UC Diagnoses  Final diagnoses:  LLQ pain     Discharge Instructions     Keep track of symptoms, drink plenty of water, and follow-up with primary care for persistent symptoms. Return for worsening pain, blood in urine, abdominal or back pain, fever.    ED Prescriptions    None     PDMP not reviewed this encounter.   Hall[place], [place], [place] 05/20/19 [phone]

## 2019-05-29 ENCOUNTER — Ambulatory Visit: Payer: BC Managed Care – PPO | Attending: Family

## 2019-05-29 DIAGNOSIS — Z23 Encounter for immunization: Secondary | ICD-10-CM

## 2019-05-29 NOTE — Progress Notes (Signed)
   Covid-19 Vaccination Clinic  Name:  [name] [name]    MRN: [id number] DOB: May 04, 1975  05/29/2019  [name] was observed post Covid-19 immunization for 15 minutes without incident. She was provided with Vaccine Information Sheet and instruction to access the V-Safe system.   [name] was instructed to call 911 with any severe reactions post vaccine: Marland Kitchen Difficulty breathing  . Swelling of face and throat  . A fast heartbeat  . A bad rash all over body  . Dizziness and weakness   Immunizations Administered    Name Date Dose VIS Date Route   Moderna COVID-19 Vaccine 05/29/2019  1:57 PM 0.5 mL 01/2019 Intramuscular   Manufacturer: Moderna   Lot: [id number]   [id number]

## 2019-08-23 ENCOUNTER — Other Ambulatory Visit: Payer: Self-pay

## 2019-08-23 ENCOUNTER — Ambulatory Visit
Admission: EM | Admit: 2019-08-23 | Discharge: 2019-08-23 | Disposition: A | Discharge status: Home / Self Care | Payer: BC Managed Care – PPO | Attending: Physician Assistant | Admitting: Physician Assistant

## 2019-08-23 DIAGNOSIS — R519 Headache, unspecified: Secondary | ICD-10-CM | POA: Diagnosis not present

## 2019-08-23 MED ORDER — METHOCARBAMOL 500 MG PO TABS: 500.0000 mg | ORAL_TABLET | Freq: Two times a day (BID) | ORAL | 0 refills | Status: DC | PRN

## 2019-08-23 MED ORDER — DEXAMETHASONE SODIUM PHOSPHATE 10 MG/ML IJ SOLN
10.0000 mg | Freq: Once | INTRAMUSCULAR | Status: AC
  Administered 2019-08-23: 10 mg via INTRAMUSCULAR

## 2019-08-23 MED ORDER — FLUTICASONE PROPIONATE 50 MCG/ACT NA SUSP: 2.0000 | Freq: Every day | NASAL | 0 refills | Status: DC

## 2019-08-23 MED ORDER — KETOROLAC TROMETHAMINE 15 MG/ML IJ SOLN
15.0000 mg | Freq: Once | INTRAMUSCULAR | Status: AC
  Administered 2019-08-23: 15 mg via INTRAMUSCULAR

## 2019-08-23 MED ORDER — METOCLOPRAMIDE HCL 5 MG/ML IJ SOLN
5.0000 mg | Freq: Once | INTRAMUSCULAR | Status: AC
  Administered 2019-08-23: 5 mg via INTRAMUSCULAR

## 2019-08-23 NOTE — Discharge Instructions (Signed)
As discussed, question migraine or tension headache. Toradol, reglan, decadron injection in office today. Flonase nasal spray for ear pressure. You can try robaxin with ibuprofen 800mg  next time you have a headache to see if this helps. Otherwise, follow up with PCP for further evaluation and management needed. If having worsening symptoms, vomiting, passing out, go to the emergency department for further evaluation.

## 2019-08-23 NOTE — ED Triage Notes (Signed)
Pt c/o migraine x 3 days. Aleve, Tylenol, motrin, benadryl and dramamine not helping

## 2019-08-23 NOTE — ED Provider Notes (Signed)
[facility]    CSN: [id number] Arrival date & time: 08/23/19  [place]      History   Chief Complaint Chief Complaint  Patient presents with  . Headache    HPI [name] [name] is a 44 y.o. female.   43 year old female with history of headache/migraines comes in for 3 day history of right sided headache. Denies injury/trauma. Has tried propranolol/triptans in the past without relief of symptoms. Can get symptoms about once every month. Can have neck/shoulder pain that starts out prior to headache onset. Currently with right sided headache, neck pain. Nausea without vomiting. Photophobia without phonophobia. Can hear swishing in the ears at times with ear pressure. Denies URI symptoms, fever. Denies head injury/LOC. Tried tylenol, aleve, ibuprofen, benadryl, dramamine without relief. Does not had taken robaxin in the past for back pain, and while doing so, felt relief for a headache similar to this.      Past Medical History:  Diagnosis Date  . Anemia   . Headache(784.0)   . PCOS (polycystic ovarian syndrome)     Patient Active Problem List   Diagnosis Date Noted  . Menorrhagia with regular cycle 02/11/2017  . Microcytic anemia 09/10/2015  . Iron deficiency anemia 07/19/2013  . Thrombocytopenia, unspecified ([place]) 07/19/2013  . Headache 07/19/2013  . Fatigue 02/28/2012  . Unspecified osteomyelitis, ankle and foot 10/26/2011  . Other complications due to other internal orthopedic device, implant, and graft 10/25/2011    Past Surgical History:  Procedure Laterality Date  . BUNIONECTOMY    . BUNIONECTOMY  05/22/11   right  . DILATATION & CURETTAGE/HYSTEROSCOPY WITH MYOSURE N/A 06/29/2017   Procedure: DILATATION & CURETTAGE/HYSTEROSCOPY WITH MYOSURE POLYPECTOMY;  Surgeon: Christophe Louis, MD;  Location: [facility];  Service: Gynecology;  Laterality: N/A;  . INTRAUTERINE DEVICE (IUD) INSERTION N/A 06/29/2017   Procedure: INTRAUTERINE DEVICE (IUD) INSERTION;  Surgeon: Christophe Louis, MD;  Location: [facility];  Service: Gynecology;  Laterality: N/A;  Mirena Placement  . LAPAROSCOPIC OVARIAN CYSTECTOMY    . LYMPH GLAND EXCISION      OB History    Gravida  3   Para  2   Term  1   Preterm  1   AB  1   Living  2     SAB  1   TAB      Ectopic      Multiple      Live Births  1           Home Medications    Prior to Admission medications   Medication Sig Start Date End Date Taking? Authorizing Provider  fluticasone (FLONASE) 50 MCG/ACT nasal spray Place 2 sprays into both nostrils daily. 08/23/19   Tasia Catchings, Zaire Levesque V, PA-C  methocarbamol (ROBAXIN) 500 MG tablet Take 1 tablet (500 mg total) by mouth 2 (two) times daily as needed for muscle spasms. 08/23/19   Tasia Catchings, Duyen Beckom V, PA-C  propranolol (INDERAL) 20 MG tablet TAKE 1 TABLET BY MOUTH THREE TIMES A DAY 05/04/18 05/19/19  Scot Jun, FNP  rizatriptan (MAXALT-MLT) 10 MG disintegrating tablet Take 10 mg by mouth as needed for migraine. May repeat in 2 hours if needed  05/19/19  [provider]    Family History Family History  Problem Relation Age of Onset  . Hypertension Mother     Social History Social History   Tobacco Use  . Smoking status: Never Smoker  . Smokeless tobacco: Never Used  Substance Use Topics  .  Alcohol use: Yes    Comment: OCASSIONALLY  . Drug use: No     Allergies   Feraheme [ferumoxytol]   Review of Systems Review of Systems  Reason unable to perform ROS: See HPI as above.     Physical Exam Triage Vital Signs ED Triage Vitals [08/23/19 1848]  Enc Vitals Group     BP (!) 155/74     Pulse Rate 88     Resp 18     Temp 97.7 F (36.5 C)     Temp src      SpO2 100 %     Weight      Height      Head Circumference      Peak Flow      Pain Score      Pain Loc      Pain Edu?      Excl. in [place]?    No data found.  Updated Vital Signs BP (!) 155/74   Pulse 88   Temp 97.7 F (36.5 C)   Resp 18   LMP 08/18/2019   SpO2 100%   Physical  Exam Constitutional:      General: She is not in acute distress.    Appearance: Normal appearance. She is well-developed. She is not toxic-appearing or diaphoretic.  HENT:     Head: Normocephalic and atraumatic.     Right Ear: Tympanic membrane, ear canal and external ear normal. Tympanic membrane is not erythematous or bulging.     Left Ear: Tympanic membrane, ear canal and external ear normal. Tympanic membrane is not erythematous or bulging.  Eyes:     Extraocular Movements: Extraocular movements intact.     Conjunctiva/sclera: Conjunctivae normal.     Pupils: Pupils are equal, round, and reactive to light.  Pulmonary:     Effort: Pulmonary effort is normal. No respiratory distress.     Comments: Speaking in full sentences without difficulty Musculoskeletal:     Cervical back: Normal range of motion and neck supple.     Comments: No tenderness to spinous processes. Tenderness to right middle trapezius. Full ROM of BLE. Strength 5/5.   Skin:    General: Skin is warm and dry.  Neurological:     Mental Status: She is alert and oriented to person, place, and time.     Comments: No focal deficits. Able to ambulate on own without difficulty.       UC Treatments / Results  Labs (all labs ordered are listed, but only abnormal results are displayed) Labs Reviewed - No data to display  EKG   Radiology No results found.  Procedures Procedures (including critical care time)  Medications Ordered in UC Medications  ketorolac (TORADOL) 15 MG/ML injection 15 mg (15 mg Intramuscular Given 08/23/19 1904)  dexamethasone (DECADRON) injection 10 mg (10 mg Intramuscular Given 08/23/19 1904)  metoCLOPramide (REGLAN) injection 5 mg (5 mg Intramuscular Given 08/23/19 1903)    Initial Impression / Assessment and Plan / UC Course  I have reviewed the triage vital signs and the nursing notes.  Pertinent labs & imaging results that were available during my care of the patient were reviewed by  me and considered in my medical decision making (see chart for details).    ?tension headache vs migraines. Will treat today with toradol, reglan, decadron injection as it has helped in the past. Will provide Rx of robaxin, can try in the future with NSAIDs when symptom onset. To follow up with PCP for reevaluation.  Return precautions given.  Final Clinical Impressions(s) / UC Diagnoses   Final diagnoses:  Right-sided headache    ED Prescriptions    Medication Sig Dispense Auth. Provider   methocarbamol (ROBAXIN) 500 MG tablet Take 1 tablet (500 mg total) by mouth 2 (two) times daily as needed for muscle spasms. 20 tablet Jannah Guardiola V, PA-C   fluticasone (FLONASE) 50 MCG/ACT nasal spray Place 2 sprays into both nostrils daily. 1 g Ok Edwards, PA-C     PDMP not reviewed this encounter.   Ok Edwards, PA-C 08/23/19 1913

## 2019-08-31 ENCOUNTER — Other Ambulatory Visit: Payer: Self-pay | Admitting: Hematology and Oncology

## 2019-08-31 DIAGNOSIS — D500 Iron deficiency anemia secondary to blood loss (chronic): Secondary | ICD-10-CM

## 2019-09-18 ENCOUNTER — Other Ambulatory Visit: Payer: Self-pay

## 2019-09-18 ENCOUNTER — Inpatient Hospital Stay: Payer: BC Managed Care – PPO

## 2019-09-18 ENCOUNTER — Inpatient Hospital Stay: Payer: BC Managed Care – PPO | Attending: Hematology and Oncology | Admitting: Hematology and Oncology

## 2019-09-18 ENCOUNTER — Other Ambulatory Visit: Payer: Self-pay | Admitting: Hematology and Oncology

## 2019-09-18 ENCOUNTER — Encounter: Payer: Self-pay | Admitting: Hematology and Oncology

## 2019-09-18 VITALS — BP 144/65 | HR 85 | Temp 98.5°F | Resp 18 | Ht 64.0 in | Wt 255.8 lb

## 2019-09-18 DIAGNOSIS — R5383 Other fatigue: Secondary | ICD-10-CM

## 2019-09-18 DIAGNOSIS — N920 Excessive and frequent menstruation with regular cycle: Secondary | ICD-10-CM

## 2019-09-18 DIAGNOSIS — D500 Iron deficiency anemia secondary to blood loss (chronic): Secondary | ICD-10-CM

## 2019-09-18 DIAGNOSIS — Z79899 Other long term (current) drug therapy: Secondary | ICD-10-CM | POA: Diagnosis not present

## 2019-09-18 DIAGNOSIS — D473 Essential (hemorrhagic) thrombocythemia: Secondary | ICD-10-CM | POA: Diagnosis not present

## 2019-09-18 DIAGNOSIS — D75839 Thrombocytosis, unspecified: Secondary | ICD-10-CM | POA: Insufficient documentation

## 2019-09-18 LAB — CBC WITH DIFFERENTIAL/PLATELET
Abs Immature Granulocytes: 0.03 10*3/uL (ref 0.00–0.07)
Basophils Absolute: 0.1 10*3/uL (ref 0.0–0.1)
Basophils Relative: 1 %
Eosinophils Absolute: 0.3 10*3/uL (ref 0.0–0.5)
Eosinophils Relative: 4 %
HCT: 22.5 % — ABNORMAL LOW (ref 36.0–46.0)
Hemoglobin: 5.4 g/dL — CL (ref 12.0–15.0)
Immature Granulocytes: 0 %
Lymphocytes Relative: 34 %
Lymphs Abs: 2.7 10*3/uL (ref 0.7–4.0)
MCH: 12.6 pg — ABNORMAL LOW (ref 26.0–34.0)
MCHC: 24 g/dL — ABNORMAL LOW (ref 30.0–36.0)
MCV: 52.6 fL — ABNORMAL LOW (ref 80.0–100.0)
Monocytes Absolute: 0.5 10*3/uL (ref 0.1–1.0)
Monocytes Relative: 6 %
Neutro Abs: 4.4 10*3/uL (ref 1.7–7.7)
Neutrophils Relative %: 55 %
Platelets: 493 10*3/uL — ABNORMAL HIGH (ref 150–400)
RBC: 4.28 MIL/uL (ref 3.87–5.11)
RDW: 26.2 % — ABNORMAL HIGH (ref 11.5–15.5)
WBC: 8 10*3/uL (ref 4.0–10.5)
nRBC: 0 % (ref 0.0–0.2)

## 2019-09-18 LAB — IRON AND TIBC
Iron: 15 ug/dL — ABNORMAL LOW (ref 41–142)
Saturation Ratios: 3 % — ABNORMAL LOW (ref 21–57)
TIBC: 438 ug/dL (ref 236–444)
UIBC: 422 ug/dL — ABNORMAL HIGH (ref 120–384)

## 2019-09-18 LAB — FERRITIN: Ferritin: <4 ng/mL — ABNORMAL LOW (ref 11–307)

## 2019-09-18 LAB — ABO/RH: ABO/RH(D): AB POS

## 2019-09-18 LAB — PREPARE RBC (CROSSMATCH)

## 2019-09-18 MED ORDER — SODIUM CHLORIDE 0.9% IV SOLUTION
250.0000 mL | Freq: Once | INTRAVENOUS | Status: AC
  Administered 2019-09-18: 250 mL via INTRAVENOUS
  Filled 2019-09-18: qty 250

## 2019-09-18 MED ORDER — DIPHENHYDRAMINE HCL 25 MG PO CAPS
25.0000 mg | ORAL_CAPSULE | Freq: Once | ORAL | Status: AC
  Administered 2019-09-18: 25 mg via ORAL

## 2019-09-18 MED ORDER — ACETAMINOPHEN 325 MG PO TABS
650.0000 mg | ORAL_TABLET | Freq: Once | ORAL | Status: AC
  Administered 2019-09-18: 650 mg via ORAL

## 2019-09-18 NOTE — Patient Instructions (Signed)

## 2019-09-18 NOTE — Assessment & Plan Note (Signed)
She is profoundly symptomatic today Due to the danger and symptomatic anemia, I recommend we proceed with blood transfusion as soon as possible We will start intravenous iron infusion next week  We discussed some of the risks, benefits, and alternatives of blood transfusions. The patient is symptomatic from anemia and the hemoglobin level is critically low.  Some of the side-effects to be expected including risks of transfusion reactions, chills, infection, syndrome of volume overload and risk of hospitalization from various reasons and the patient is willing to proceed and went ahead to sign consent today. She will receive 2 units of blood Due to the time constraint, she will receive 1 unit of blood today and 1 unit of blood tomorrow  Due to history of allergic reaction to IV Feraheme, I will prescribe IV sucrose The most likely cause of her anemia is due to chronic blood loss/malabsorption syndrome. We discussed some of the risks, benefits, and alternatives of intravenous iron infusions. The patient is symptomatic from anemia and the iron level is critically low. She tolerated oral iron supplement poorly and desires to achieved higher levels of iron faster for adequate hematopoesis. Some of the side-effects to be expected including risks of infusion reactions, phlebitis, headaches, nausea and fatigue.  The patient is willing to proceed. Patient education material was dispensed.  Goal is to keep ferritin level greater than 50 and resolution of anemia I will add premedication Pepcid and low-dose Solu-Medrol to avoid risk of allergic reaction to IV sucrose  I plan to see her back next month for further follow-up We discussed the importance of her not taking aspirin or NSAID

## 2019-09-18 NOTE — Assessment & Plan Note (Signed)
This is reactive thrombocytosis due to severe iron deficiency anemia I expect that to resolve next month after blood transfusion and IV iron

## 2019-09-18 NOTE — Progress Notes (Signed)
[name] OFF[facility] PROGRESS NOTE  [name], [name], MD  ASSESSMENT & PLAN:  Iron deficiency anemia She is profoundly symptomatic today Due to the danger and symptomatic anemia, I recommend we proceed with blood transfusion as soon as possible We will start intravenous iron infusion next week  We discussed some of the risks, benefits, and alternatives of blood transfusions. The patient is symptomatic from anemia and the hemoglobin level is critically low.  Some of the side-effects to be expected including risks of transfusion reactions, chills, infection, syndrome of volume overload and risk of hospitalization from various reasons and the patient is willing to proceed and went ahead to sign consent today. She will receive 2 units of blood Due to the time constraint, she will receive 1 unit of blood today and 1 unit of blood tomorrow  Due to history of allergic reaction to IV Feraheme, I will prescribe IV sucrose The most likely cause of her anemia is due to chronic blood loss/malabsorption syndrome. We discussed some of the risks, benefits, and alternatives of intravenous iron infusions. The patient is symptomatic from anemia and the iron level is critically low. She tolerated oral iron supplement poorly and desires to achieved higher levels of iron faster for adequate hematopoesis. Some of the side-effects to be expected including risks of infusion reactions, phlebitis, headaches, nausea and fatigue.  The patient is willing to proceed. Patient education material was dispensed.  Goal is to keep ferritin level greater than 50 and resolution of anemia I will add premedication Pepcid and low-dose Solu-Medrol to avoid risk of allergic reaction to IV sucrose  I plan to see her back next month for further follow-up We discussed the importance of her not taking aspirin or NSAID  Menorrhagia with regular cycle She have severe, recurrent menorrhagia I recommend the patient to get  an appointment to see her gynecologist soon for further evaluation and management of heavy menstruation, which is the most likely cause of her severe anemia  Thrombocytosis ([place]) This is reactive thrombocytosis due to severe iron deficiency anemia I expect that to resolve next month after blood transfusion and IV iron   Orders Placed This Encounter  Procedures  . CBC with Differential/Platelet    Standing Status:   Standing    Number of Occurrences:   22    Standing Expiration Date:   09/17/2020  . Informed Consent Details: Physician/Practitioner Attestation; Transcribe to consent form and obtain patient signature    Standing Status:   Future    Standing Expiration Date:   09/17/2020    Order Specific Question:   Physician/Practitioner attestation of informed consent for blood and or blood product transfusion    Answer:   I, the physician/practitioner, attest that I have discussed with the patient the benefits, risks, side effects, alternatives, likelihood of achieving goals and potential problems during recovery for the procedure that I have provided informed consent.    Order Specific Question:   Product(s)    Answer:   All Product(s)  . Care order/instruction    Transfuse Parameters    Standing Status:   Future    Standing Expiration Date:   09/17/2020  . Type and screen    Standing Status:   Future    Number of Occurrences:   1    Standing Expiration Date:   09/17/2020  . Sample to Blood Bank    Standing Status:   Standing    Number of Occurrences:   33    Standing  Expiration Date:   09/17/2020  . ABO/RH    Standing Status:   Future    Standing Expiration Date:   09/17/2020    The total time spent in the appointment was 40 minutes encounter with patients including review of chart and various tests results, discussions about plan of care and coordination of care plan   All questions were answered. The patient knows to call the clinic with any problems, questions or concerns. No  barriers to learning was detected.    [name], MD 8/10/202112:10 PM  INTERVAL HISTORY: [name] [name] 44 y.o. female returns for recurrent severe iron deficiency anemia She has not returned to see me since 2019 due to her scheduling issues She has been having chronic severe menorrhagia over the last 2 months She will have 1 very heavy bleeding day out of 5 days of menstruation every month Recently, she has been complaining of chest discomfort, dizziness and lightheadedness She also have pica with excessive chewing of ice She also have severe migraine headaches and has been taking ibuprofen for that The patient denies any recent signs or symptoms of bleeding such as spontaneous epistaxis, hematuria or hematochezia.  SUMMARY OF HEMATOLOGIC HISTORY:  She was found to have abnormal CBC from recent blood work for evaluation of fatigue. She denies recent chest pain on exertion, pre-syncopal episodes, or palpitations. She does complain of leg cramps, dizziness, shortness of breath on minimal exertion, and frequent headaches. Recent CBC done last month in May 2015 shows significant anemia with hemoglobin 7.9, MCV of 55 and platelet count of 409. She received one dose of intravenous iron on 07/23/2013. After infusion, she complained of scratchy throat and developed hives. Subsequently, she received premedication with Solu-Medrol and she was able to complete her treatment in July 2015 She received further IV iron in June 2017 for recurrent iron deficiency anemia In February 2019, EGD and colonoscopy excluded GI blood loss On 06/29/2017, she underwent D&C and polypectomy She has received several doses of intravenous iron in 2019  I have reviewed the past medical history, past surgical history, social history and family history with the patient and they are unchanged from previous note.  ALLERGIES:  is allergic to feraheme [ferumoxytol].  MEDICATIONS:  Current Outpatient Medications   Medication Sig Dispense Refill  . fluticasone (FLONASE) 50 MCG/ACT nasal spray Place 2 sprays into both nostrils daily. 1 g 0  . methocarbamol (ROBAXIN) 500 MG tablet Take 1 tablet (500 mg total) by mouth 2 (two) times daily as needed for muscle spasms. 20 tablet 0   No current facility-administered medications for this visit.     REVIEW OF SYSTEMS:   Constitutional: Denies fevers, chills or night sweats Eyes: Denies blurriness of vision Ears, nose, mouth, throat, and face: Denies mucositis or sore throat Gastrointestinal:  Denies nausea, heartburn or change in bowel habits Skin: Denies abnormal skin rashes Lymphatics: Denies new lymphadenopathy or easy bruising Behavioral/Psych: Mood is stable, no new changes  All other systems were reviewed with the patient and are negative.  PHYSICAL EXAMINATION: ECOG PERFORMANCE STATUS: 1 - Symptomatic but completely ambulatory  Vitals:   09/18/19 1142  BP: (!) 144/65  Pulse: 85  Resp: 18  Temp: 98.5 F (36.9 C)  SpO2: 100%   Filed Weights   09/18/19 1142  Weight: 255 lb 12.8 oz (116 kg)    GENERAL:alert, no distress and comfortable.  She looks pale SKIN: skin color, texture, turgor are normal, no rashes or significant lesions EYES: normal, Conjunctiva  are pink and non-injected, sclera clear OROPHARYNX:no exudate, no erythema and lips, buccal mucosa, and tongue normal  NECK: supple, thyroid normal size, non-tender, without nodularity LYMPH:  no palpable lymphadenopathy in the cervical, axillary or inguinal LUNGS: clear to auscultation and percussion with normal breathing effort HEART: regular rate & rhythm and no murmurs and no lower extremity edema ABDOMEN:abdomen soft, non-tender and normal bowel sounds Musculoskeletal:no cyanosis of digits and no clubbing  NEURO: alert & oriented x 3 with fluent speech, no focal motor/sensory deficits  LABORATORY DATA:  I have reviewed the data as listed     Component Value Date/Time   NA  139 02/11/2017 1000   K 3.4 (L) 02/11/2017 1000   CL 103 02/28/2012 1607   CO2 26 02/11/2017 1000   GLUCOSE 108 02/11/2017 1000   BUN 12.0 02/11/2017 1000   CREATININE 0.7 02/11/2017 1000   CALCIUM 9.1 02/11/2017 1000   PROT 7.3 02/11/2017 1000   ALBUMIN 3.5 02/11/2017 1000   AST 15 02/11/2017 1000   ALT 15 02/11/2017 1000   ALKPHOS 48 02/11/2017 1000   BILITOT <0.22 02/11/2017 1000    No results found for: SPEP, UPEP  Lab Results  Component Value Date   WBC 8.0 09/18/2019   NEUTROABS 4.4 09/18/2019   HGB 5.4 (LL) 09/18/2019   HCT 22.5 (L) 09/18/2019   MCV 52.6 (L) 09/18/2019   PLT 493 (H) 09/18/2019      Chemistry      Component Value Date/Time   NA 139 02/11/2017 1000   K 3.4 (L) 02/11/2017 1000   CL 103 02/28/2012 1607   CO2 26 02/11/2017 1000   BUN 12.0 02/11/2017 1000   CREATININE 0.7 02/11/2017 1000      Component Value Date/Time   CALCIUM 9.1 02/11/2017 1000   ALKPHOS 48 02/11/2017 1000   AST 15 02/11/2017 1000   ALT 15 02/11/2017 1000   BILITOT <0.22 02/11/2017 1000

## 2019-09-18 NOTE — Progress Notes (Signed)
Reported hemoglobin 5.4 to Dr. Alvy Bimler.

## 2019-09-18 NOTE — Assessment & Plan Note (Signed)
She have severe, recurrent menorrhagia I recommend the patient to get an appointment to see her gynecologist soon for further evaluation and management of heavy menstruation, which is the most likely cause of her severe anemia 

## 2019-09-19 ENCOUNTER — Other Ambulatory Visit: Payer: Self-pay

## 2019-09-19 ENCOUNTER — Inpatient Hospital Stay: Payer: BC Managed Care – PPO

## 2019-09-19 DIAGNOSIS — D500 Iron deficiency anemia secondary to blood loss (chronic): Secondary | ICD-10-CM

## 2019-09-19 MED ORDER — DIPHENHYDRAMINE HCL 25 MG PO CAPS
ORAL_CAPSULE | ORAL | Status: AC
  Filled 2019-09-19: qty 1

## 2019-09-19 MED ORDER — ACETAMINOPHEN 325 MG PO TABS
650.0000 mg | ORAL_TABLET | Freq: Once | ORAL | Status: AC
  Administered 2019-09-19: 650 mg via ORAL

## 2019-09-19 MED ORDER — ACETAMINOPHEN 325 MG PO TABS
ORAL_TABLET | ORAL | Status: AC
  Filled 2019-09-19: qty 2

## 2019-09-19 MED ORDER — DIPHENHYDRAMINE HCL 25 MG PO CAPS
25.0000 mg | ORAL_CAPSULE | Freq: Once | ORAL | Status: AC
  Administered 2019-09-19: 25 mg via ORAL

## 2019-09-19 MED ORDER — SODIUM CHLORIDE 0.9% IV SOLUTION
250.0000 mL | Freq: Once | INTRAVENOUS | Status: AC
  Administered 2019-09-19: 250 mL via INTRAVENOUS
  Filled 2019-09-19: qty 250

## 2019-09-19 NOTE — Patient Instructions (Signed)

## 2019-09-20 LAB — TYPE AND SCREEN
ABO/RH(D): AB POS
Antibody Screen: NEGATIVE
Unit division: 0
Unit division: 0

## 2019-09-20 LAB — BPAM RBC
Blood Product Expiration Date: 202108192359
Blood Product Expiration Date: 202108232359
ISSUE DATE / TIME: 202108101529
ISSUE DATE / TIME: 202108110900
Unit Type and Rh: 6200
Unit Type and Rh: 6200

## 2019-09-25 ENCOUNTER — Inpatient Hospital Stay: Payer: BC Managed Care – PPO

## 2019-09-25 ENCOUNTER — Other Ambulatory Visit: Payer: Self-pay

## 2019-09-25 VITALS — BP 149/70 | HR 75 | Temp 98.6°F | Resp 16

## 2019-09-25 DIAGNOSIS — D500 Iron deficiency anemia secondary to blood loss (chronic): Secondary | ICD-10-CM | POA: Diagnosis not present

## 2019-09-25 MED ORDER — SODIUM CHLORIDE 0.9 % IV SOLN
INTRAVENOUS | Status: DC
  Filled 2019-09-25: qty 250

## 2019-09-25 MED ORDER — SODIUM CHLORIDE 0.9 % IV SOLN
200.0000 mg | Freq: Once | INTRAVENOUS | Status: AC
  Administered 2019-09-25: 200 mg via INTRAVENOUS
  Filled 2019-09-25: qty 200

## 2019-09-25 MED ORDER — METHYLPREDNISOLONE SODIUM SUCC 40 MG IJ SOLR
40.0000 mg | Freq: Once | INTRAMUSCULAR | Status: AC
  Administered 2019-09-25: 40 mg via INTRAVENOUS

## 2019-09-25 MED ORDER — FAMOTIDINE IN NACL 20-0.9 MG/50ML-% IV SOLN
20.0000 mg | Freq: Once | INTRAVENOUS | Status: AC
  Administered 2019-09-25: 20 mg via INTRAVENOUS

## 2019-09-25 MED ORDER — METHYLPREDNISOLONE SODIUM SUCC 40 MG IJ SOLR
INTRAMUSCULAR | Status: AC
  Filled 2019-09-25: qty 1

## 2019-09-25 MED ORDER — FAMOTIDINE IN NACL 20-0.9 MG/50ML-% IV SOLN
INTRAVENOUS | Status: AC
  Filled 2019-09-25: qty 50

## 2019-09-25 NOTE — Patient Instructions (Signed)

## 2019-10-01 ENCOUNTER — Encounter: Payer: Self-pay | Admitting: Hematology and Oncology

## 2019-10-02 ENCOUNTER — Telehealth: Payer: Self-pay

## 2019-10-02 ENCOUNTER — Inpatient Hospital Stay: Payer: BC Managed Care – PPO

## 2019-10-02 ENCOUNTER — Other Ambulatory Visit: Payer: Self-pay

## 2019-10-02 ENCOUNTER — Other Ambulatory Visit: Payer: Self-pay | Admitting: Hematology and Oncology

## 2019-10-02 DIAGNOSIS — N920 Excessive and frequent menstruation with regular cycle: Secondary | ICD-10-CM

## 2019-10-02 DIAGNOSIS — D500 Iron deficiency anemia secondary to blood loss (chronic): Secondary | ICD-10-CM | POA: Diagnosis not present

## 2019-10-02 DIAGNOSIS — R5383 Other fatigue: Secondary | ICD-10-CM

## 2019-10-02 LAB — CBC WITH DIFFERENTIAL/PLATELET
Abs Immature Granulocytes: 0.03 10*3/uL (ref 0.00–0.07)
Basophils Absolute: 0.1 10*3/uL (ref 0.0–0.1)
Basophils Relative: 1 %
Eosinophils Absolute: 0.2 10*3/uL (ref 0.0–0.5)
Eosinophils Relative: 2 %
HCT: 31.3 % — ABNORMAL LOW (ref 36.0–46.0)
Hemoglobin: 8.3 g/dL — ABNORMAL LOW (ref 12.0–15.0)
Immature Granulocytes: 0 %
Lymphocytes Relative: 27 %
Lymphs Abs: 2.4 10*3/uL (ref 0.7–4.0)
MCH: 16.4 pg — ABNORMAL LOW (ref 26.0–34.0)
MCHC: 26.5 g/dL — ABNORMAL LOW (ref 30.0–36.0)
MCV: 62 fL — ABNORMAL LOW (ref 80.0–100.0)
Monocytes Absolute: 0.5 10*3/uL (ref 0.1–1.0)
Monocytes Relative: 6 %
Neutro Abs: 5.7 10*3/uL (ref 1.7–7.7)
Neutrophils Relative %: 64 %
Platelets: 370 10*3/uL (ref 150–400)
RBC: 5.05 MIL/uL (ref 3.87–5.11)
RDW: 35.4 % — ABNORMAL HIGH (ref 11.5–15.5)
WBC: 8.9 10*3/uL (ref 4.0–10.5)
nRBC: 0 % (ref 0.0–0.2)

## 2019-10-02 LAB — SAMPLE TO BLOOD BANK

## 2019-10-02 MED ORDER — PROCHLORPERAZINE MALEATE 10 MG PO TABS
10.0000 mg | ORAL_TABLET | Freq: Four times a day (QID) | ORAL | 0 refills | Status: DC | PRN
Start: 2019-10-02 — End: 2020-06-06

## 2019-10-02 NOTE — Telephone Encounter (Signed)
Called and left a message asking her to call the office back. 

## 2019-10-02 NOTE — Telephone Encounter (Signed)
Called and given below message. She verbalzied understanding. Scheduled for labs at 2:30 today. Compazine Rx sent to preferred pharmacy.

## 2019-10-02 NOTE — Telephone Encounter (Signed)
Called and given hemoglobin 8.3. She does not need blood. She verbalized understanding.

## 2019-10-02 NOTE — Telephone Encounter (Signed)
-----   Message from [name], MD sent at 10/02/2019  9:12 AM EDT ----- Regarding: pls call her Please call her Get her to come in for labs today and call in some compazine for nausea I cannot see her today but at least we can get labs

## 2019-10-03 ENCOUNTER — Inpatient Hospital Stay: Payer: BC Managed Care – PPO

## 2019-10-03 ENCOUNTER — Other Ambulatory Visit: Payer: Self-pay

## 2019-10-03 VITALS — BP 128/63 | HR 79 | Temp 98.2°F | Resp 17 | Wt 256.5 lb

## 2019-10-03 DIAGNOSIS — D500 Iron deficiency anemia secondary to blood loss (chronic): Secondary | ICD-10-CM | POA: Diagnosis not present

## 2019-10-03 MED ORDER — METHYLPREDNISOLONE SODIUM SUCC 40 MG IJ SOLR
INTRAMUSCULAR | Status: AC
  Filled 2019-10-03: qty 1

## 2019-10-03 MED ORDER — FAMOTIDINE IN NACL 20-0.9 MG/50ML-% IV SOLN
20.0000 mg | Freq: Once | INTRAVENOUS | Status: AC
  Administered 2019-10-03: 20 mg via INTRAVENOUS

## 2019-10-03 MED ORDER — SODIUM CHLORIDE 0.9 % IV SOLN
200.0000 mg | Freq: Once | INTRAVENOUS | Status: AC
  Administered 2019-10-03: 200 mg via INTRAVENOUS
  Filled 2019-10-03: qty 200

## 2019-10-03 MED ORDER — SODIUM CHLORIDE 0.9 % IV SOLN
Freq: Once | INTRAVENOUS | Status: AC
  Filled 2019-10-03: qty 250

## 2019-10-03 MED ORDER — FAMOTIDINE IN NACL 20-0.9 MG/50ML-% IV SOLN
INTRAVENOUS | Status: AC
  Filled 2019-10-03: qty 50

## 2019-10-03 MED ORDER — METHYLPREDNISOLONE SODIUM SUCC 40 MG IJ SOLR
40.0000 mg | Freq: Once | INTRAMUSCULAR | Status: AC
  Administered 2019-10-03: 40 mg via INTRAVENOUS

## 2019-10-08 ENCOUNTER — Encounter: Payer: Self-pay | Admitting: Hematology and Oncology

## 2019-10-10 ENCOUNTER — Other Ambulatory Visit: Payer: Self-pay

## 2019-10-10 ENCOUNTER — Inpatient Hospital Stay: Payer: BC Managed Care – PPO | Attending: Hematology and Oncology

## 2019-10-10 VITALS — BP 119/87 | HR 86 | Temp 98.2°F | Resp 16

## 2019-10-10 DIAGNOSIS — N920 Excessive and frequent menstruation with regular cycle: Secondary | ICD-10-CM | POA: Diagnosis not present

## 2019-10-10 DIAGNOSIS — D500 Iron deficiency anemia secondary to blood loss (chronic): Secondary | ICD-10-CM | POA: Insufficient documentation

## 2019-10-10 DIAGNOSIS — Z79899 Other long term (current) drug therapy: Secondary | ICD-10-CM | POA: Insufficient documentation

## 2019-10-10 MED ORDER — DIPHENHYDRAMINE HCL 25 MG PO CAPS
ORAL_CAPSULE | ORAL | Status: AC
  Filled 2019-10-10: qty 2

## 2019-10-10 MED ORDER — FAMOTIDINE IN NACL 20-0.9 MG/50ML-% IV SOLN
20.0000 mg | Freq: Once | INTRAVENOUS | Status: AC
  Administered 2019-10-10: 20 mg via INTRAVENOUS

## 2019-10-10 MED ORDER — METHYLPREDNISOLONE SODIUM SUCC 40 MG IJ SOLR
40.0000 mg | Freq: Once | INTRAMUSCULAR | Status: AC
  Administered 2019-10-10: 40 mg via INTRAVENOUS

## 2019-10-10 MED ORDER — METHYLPREDNISOLONE SODIUM SUCC 40 MG IJ SOLR
INTRAMUSCULAR | Status: AC
  Filled 2019-10-10: qty 1

## 2019-10-10 MED ORDER — FAMOTIDINE IN NACL 20-0.9 MG/50ML-% IV SOLN
INTRAVENOUS | Status: AC
  Filled 2019-10-10: qty 50

## 2019-10-10 MED ORDER — SODIUM CHLORIDE 0.9 % IV SOLN
200.0000 mg | Freq: Once | INTRAVENOUS | Status: AC
  Administered 2019-10-10: 200 mg via INTRAVENOUS
  Filled 2019-10-10: qty 200

## 2019-10-10 MED ORDER — ACETAMINOPHEN 325 MG PO TABS
ORAL_TABLET | ORAL | Status: AC
  Filled 2019-10-10: qty 2

## 2019-10-10 MED ORDER — SODIUM CHLORIDE 0.9 % IV SOLN
Freq: Once | INTRAVENOUS | Status: AC
  Filled 2019-10-10: qty 250

## 2019-10-10 NOTE — Patient Instructions (Signed)

## 2019-10-17 ENCOUNTER — Inpatient Hospital Stay: Payer: BC Managed Care – PPO

## 2019-10-17 ENCOUNTER — Other Ambulatory Visit: Payer: Self-pay

## 2019-10-17 VITALS — BP 134/63 | HR 91 | Temp 98.6°F | Resp 17

## 2019-10-17 DIAGNOSIS — D500 Iron deficiency anemia secondary to blood loss (chronic): Secondary | ICD-10-CM | POA: Diagnosis not present

## 2019-10-17 MED ORDER — METHYLPREDNISOLONE SODIUM SUCC 40 MG IJ SOLR
40.0000 mg | Freq: Once | INTRAMUSCULAR | Status: AC
  Administered 2019-10-17: 40 mg via INTRAVENOUS

## 2019-10-17 MED ORDER — SODIUM CHLORIDE 0.9 % IV SOLN
200.0000 mg | Freq: Once | INTRAVENOUS | Status: AC
  Administered 2019-10-17: 200 mg via INTRAVENOUS
  Filled 2019-10-17: qty 200

## 2019-10-17 MED ORDER — FAMOTIDINE IN NACL 20-0.9 MG/50ML-% IV SOLN
INTRAVENOUS | Status: AC
  Filled 2019-10-17: qty 50

## 2019-10-17 MED ORDER — METHYLPREDNISOLONE SODIUM SUCC 40 MG IJ SOLR
INTRAMUSCULAR | Status: AC
  Filled 2019-10-17: qty 1

## 2019-10-17 MED ORDER — FAMOTIDINE IN NACL 20-0.9 MG/50ML-% IV SOLN
20.0000 mg | Freq: Once | INTRAVENOUS | Status: AC
  Administered 2019-10-17: 20 mg via INTRAVENOUS

## 2019-10-17 MED ORDER — SODIUM CHLORIDE 0.9 % IV SOLN
Freq: Once | INTRAVENOUS | Status: AC
  Filled 2019-10-17: qty 250

## 2019-10-17 NOTE — Patient Instructions (Signed)

## 2019-10-23 ENCOUNTER — Encounter: Payer: Self-pay | Admitting: Hematology and Oncology

## 2019-10-23 ENCOUNTER — Inpatient Hospital Stay: Payer: BC Managed Care – PPO

## 2019-10-23 ENCOUNTER — Inpatient Hospital Stay (HOSPITAL_BASED_OUTPATIENT_CLINIC_OR_DEPARTMENT_OTHER): Payer: BC Managed Care – PPO | Admitting: Hematology and Oncology

## 2019-10-23 ENCOUNTER — Other Ambulatory Visit: Payer: Self-pay | Admitting: Hematology and Oncology

## 2019-10-23 ENCOUNTER — Other Ambulatory Visit: Payer: Self-pay

## 2019-10-23 VITALS — BP 142/76 | HR 87 | Resp 16

## 2019-10-23 DIAGNOSIS — R5383 Other fatigue: Secondary | ICD-10-CM

## 2019-10-23 DIAGNOSIS — D500 Iron deficiency anemia secondary to blood loss (chronic): Secondary | ICD-10-CM

## 2019-10-23 DIAGNOSIS — D473 Essential (hemorrhagic) thrombocythemia: Secondary | ICD-10-CM

## 2019-10-23 DIAGNOSIS — D75839 Thrombocytosis, unspecified: Secondary | ICD-10-CM

## 2019-10-23 DIAGNOSIS — N920 Excessive and frequent menstruation with regular cycle: Secondary | ICD-10-CM

## 2019-10-23 LAB — CBC WITH DIFFERENTIAL/PLATELET
Abs Immature Granulocytes: 0.03 10*3/uL (ref 0.00–0.07)
Basophils Absolute: 0.1 10*3/uL (ref 0.0–0.1)
Basophils Relative: 1 %
Eosinophils Absolute: 0.2 10*3/uL (ref 0.0–0.5)
Eosinophils Relative: 2 %
HCT: 33 % — ABNORMAL LOW (ref 36.0–46.0)
Hemoglobin: 8.9 g/dL — ABNORMAL LOW (ref 12.0–15.0)
Immature Granulocytes: 0 %
Lymphocytes Relative: 27 %
Lymphs Abs: 2.3 10*3/uL (ref 0.7–4.0)
MCH: 19.1 pg — ABNORMAL LOW (ref 26.0–34.0)
MCHC: 27 g/dL — ABNORMAL LOW (ref 30.0–36.0)
MCV: 70.7 fL — ABNORMAL LOW (ref 80.0–100.0)
Monocytes Absolute: 0.5 10*3/uL (ref 0.1–1.0)
Monocytes Relative: 5 %
Neutro Abs: 5.6 10*3/uL (ref 1.7–7.7)
Neutrophils Relative %: 65 %
Platelets: 414 10*3/uL — ABNORMAL HIGH (ref 150–400)
RBC: 4.67 MIL/uL (ref 3.87–5.11)
WBC: 8.6 10*3/uL (ref 4.0–10.5)
nRBC: 0 % (ref 0.0–0.2)

## 2019-10-23 LAB — SAMPLE TO BLOOD BANK

## 2019-10-23 MED ORDER — METHYLPREDNISOLONE SODIUM SUCC 40 MG IJ SOLR
20.0000 mg | Freq: Once | INTRAMUSCULAR | Status: AC
  Administered 2019-10-23: 20 mg via INTRAVENOUS

## 2019-10-23 MED ORDER — FAMOTIDINE IN NACL 20-0.9 MG/50ML-% IV SOLN
20.0000 mg | Freq: Once | INTRAVENOUS | Status: AC
  Administered 2019-10-23: 20 mg via INTRAVENOUS

## 2019-10-23 MED ORDER — SODIUM CHLORIDE 0.9 % IV SOLN
200.0000 mg | Freq: Once | INTRAVENOUS | Status: AC
  Administered 2019-10-23: 200 mg via INTRAVENOUS
  Filled 2019-10-23: qty 200

## 2019-10-23 MED ORDER — SODIUM CHLORIDE 0.9 % IV SOLN
Freq: Once | INTRAVENOUS | Status: AC
  Filled 2019-10-23: qty 250

## 2019-10-23 MED ORDER — METHYLPREDNISOLONE SODIUM SUCC 40 MG IJ SOLR
INTRAMUSCULAR | Status: AC
  Filled 2019-10-23: qty 1

## 2019-10-23 MED ORDER — FAMOTIDINE IN NACL 20-0.9 MG/50ML-% IV SOLN
INTRAVENOUS | Status: AC
  Filled 2019-10-23: qty 50

## 2019-10-23 NOTE — Assessment & Plan Note (Signed)
This is reactive thrombocytosis due to severe iron deficiency anemia I expect that to resolve next month after further IV iron

## 2019-10-23 NOTE — Assessment & Plan Note (Signed)
She is profoundly symptomatic today  Due to history of allergic reaction to IV Feraheme, I will continue to prescribe IV sucrose Based on her current labs, she will need further intravenous iron sucrose for at least 1 more month Due to recent weight gain, I plan to reduce premedication steroids  The most likely cause of her anemia is due to chronic blood loss/malabsorption syndrome. We discussed some of the risks, benefits, and alternatives of intravenous iron infusions. The patient is symptomatic from anemia and the iron level is critically low. She tolerated oral iron supplement poorly and desires to achieved higher levels of iron faster for adequate hematopoesis. Some of the side-effects to be expected including risks of infusion reactions, phlebitis, headaches, nausea and fatigue.  The patient is willing to proceed. Patient education material was dispensed.  Goal is to keep ferritin level greater than 50 and resolution of anemia I will add premedication Pepcid and low-dose Solu-Medrol to avoid risk of allergic reaction to IV sucrose  I plan to see her back in 2 months for further follow-up We discussed the importance of her not taking aspirin or NSAID

## 2019-10-23 NOTE — Assessment & Plan Note (Signed)
She have severe, recurrent menorrhagia I recommend the patient to get an appointment to see her gynecologist soon for further evaluation and management of heavy menstruation, which is the most likely cause of her severe anemia

## 2019-10-23 NOTE — Patient Instructions (Signed)

## 2019-10-23 NOTE — Progress Notes (Signed)
[name]  [name], [name], MD  ASSESSMENT & PLAN:  Iron deficiency anemia She is profoundly symptomatic today  Due to history of allergic reaction to IV Feraheme, I will continue to prescribe IV sucrose Based on her current labs, she will need further intravenous iron sucrose for at least 1 more month Due to recent weight gain, I plan to reduce premedication steroids  The most likely cause of her anemia is due to chronic blood loss/malabsorption syndrome. We discussed some of the risks, benefits, and alternatives of intravenous iron infusions. The patient is symptomatic from anemia and the iron level is critically low. She tolerated oral iron supplement poorly and desires to achieved higher levels of iron faster for adequate hematopoesis. Some of the side-effects to be expected including risks of infusion reactions, phlebitis, headaches, nausea and fatigue.  The patient is willing to proceed. Patient education material was dispensed.  Goal is to keep ferritin level greater than 50 and resolution of anemia I will add premedication Pepcid and low-dose Solu-Medrol to avoid risk of allergic reaction to IV sucrose  I plan to see her back in 2 months for further follow-up We discussed the importance of her not taking aspirin or NSAID  Menorrhagia with regular cycle She have severe, recurrent menorrhagia I recommend the patient to get an appointment to see her gynecologist soon for further evaluation and management of heavy menstruation, which is the most likely cause of her severe anemia  Thrombocytosis ([place]) This is reactive thrombocytosis due to severe iron deficiency anemia I expect that to resolve next month after further IV iron   Orders Placed This Encounter  Procedures  . Iron and TIBC    Standing Status:   Standing    Number of Occurrences:   2    Standing Expiration Date:   10/22/2020  . Ferritin    Standing Status:   Standing     Number of Occurrences:   2    Standing Expiration Date:   10/22/2020    The total time spent in the appointment was 20 minutes encounter with patients including review of chart and various tests results, discussions about plan of care and coordination of care plan   All questions were answered. The patient knows to call the clinic with any problems, questions or concerns. No barriers to learning was detected.    [name] Lark, MD 9/14/202110:32 AM  INTERVAL HISTORY: [name] [name] 44 y.o. female returns for further follow-up on recurrent iron deficiency anemia due to menorrhagia She continues to have intermittent headaches She has not been able to see the gynecologist yet because she have some outstanding bills She continues to have heavy menstruation She denies infusion reaction but has gained a lot of weight She continues to have excessive fatigue  SUMMARY OF HEMATOLOGIC HISTORY:  She was found to have abnormal CBC from recent blood work for evaluation of fatigue. She denies recent chest pain on exertion, pre-syncopal episodes, or palpitations. She does complain of leg cramps, dizziness, shortness of breath on minimal exertion, and frequent headaches. Recent CBC done last month in May 2015 shows significant anemia with hemoglobin 7.9, MCV of 55 and platelet count of 409. She received one dose of intravenous iron on 07/23/2013. After infusion, she complained of scratchy throat and developed hives. Subsequently, she received premedication with Solu-Medrol and she was able to complete her treatment in July 2015 She received further IV iron in June 2017 for recurrent iron deficiency anemia  In February 2019, EGD and colonoscopy excluded GI blood loss On 06/29/2017, she underwent D&C and polypectomy She has received several doses of intravenous iron in 2019 And 2021, she received blood transfusion and intravenous iron due to severe menorrhagia  I have reviewed the past medical history,  past surgical history, social history and family history with the patient and they are unchanged from previous note.  ALLERGIES:  is allergic to feraheme [ferumoxytol].  MEDICATIONS:  Current Outpatient Medications  Medication Sig Dispense Refill  . fluticasone (FLONASE) 50 MCG/ACT nasal spray Place 2 sprays into both nostrils daily. 1 g 0  . methocarbamol (ROBAXIN) 500 MG tablet Take 1 tablet (500 mg total) by mouth 2 (two) times daily as needed for muscle spasms. 20 tablet 0  . prochlorperazine (COMPAZINE) 10 MG tablet Take 1 tablet (10 mg total) by mouth every 6 (six) hours as needed for nausea or vomiting. 30 tablet 0   No current facility-administered medications for this visit.     REVIEW OF SYSTEMS:   Constitutional: Denies fevers, chills or night sweats Eyes: Denies blurriness of vision Ears, nose, mouth, throat, and face: Denies mucositis or sore throat Respiratory: Denies cough, dyspnea or wheezes Cardiovascular: Denies palpitation, chest discomfort or lower extremity swelling Gastrointestinal:  Denies nausea, heartburn or change in bowel habits Skin: Denies abnormal skin rashes Lymphatics: Denies new lymphadenopathy or easy bruising Neurological:Denies numbness, tingling or new weaknesses Behavioral/Psych: Mood is stable, no new changes  All other systems were reviewed with the patient and are negative.  PHYSICAL EXAMINATION: ECOG PERFORMANCE STATUS: 1 - Symptomatic but completely ambulatory  Vitals:   10/23/19 1000  BP: (!) 128/53  Pulse: 91  Resp: 18  Temp: 97.7 F (36.5 C)  SpO2: 100%   Filed Weights   10/23/19 1000  Weight: 262 lb 9.6 oz (119.1 kg)    GENERAL:alert, no distress and comfortable NEURO: alert & oriented x 3 with fluent speech, no focal motor/sensory deficits  LABORATORY DATA:  I have reviewed the data as listed     Component Value Date/Time   NA 139 02/11/2017 1000   K 3.4 (L) 02/11/2017 1000   CL 103 02/28/2012 1607   CO2 26  02/11/2017 1000   GLUCOSE 108 02/11/2017 1000   BUN 12.0 02/11/2017 1000   CREATININE 0.7 02/11/2017 1000   CALCIUM 9.1 02/11/2017 1000   PROT 7.3 02/11/2017 1000   ALBUMIN 3.5 02/11/2017 1000   AST 15 02/11/2017 1000   ALT 15 02/11/2017 1000   ALKPHOS 48 02/11/2017 1000   BILITOT <0.22 02/11/2017 1000    No results found for: SPEP, UPEP  Lab Results  Component Value Date   WBC 8.6 10/23/2019   NEUTROABS 5.6 10/23/2019   HGB 8.9 (L) 10/23/2019   HCT 33.0 (L) 10/23/2019   MCV 70.7 (L) 10/23/2019   PLT 414 (H) 10/23/2019      Chemistry      Component Value Date/Time   NA 139 02/11/2017 1000   K 3.4 (L) 02/11/2017 1000   CL 103 02/28/2012 1607   CO2 26 02/11/2017 1000   BUN 12.0 02/11/2017 1000   CREATININE 0.7 02/11/2017 1000      Component Value Date/Time   CALCIUM 9.1 02/11/2017 1000   ALKPHOS 48 02/11/2017 1000   AST 15 02/11/2017 1000   ALT 15 02/11/2017 1000   BILITOT <0.22 02/11/2017 1000

## 2019-10-31 ENCOUNTER — Inpatient Hospital Stay: Payer: BC Managed Care – PPO

## 2019-10-31 ENCOUNTER — Other Ambulatory Visit: Payer: Self-pay

## 2019-10-31 VITALS — BP 158/67 | HR 74 | Temp 98.2°F | Resp 16

## 2019-10-31 DIAGNOSIS — D500 Iron deficiency anemia secondary to blood loss (chronic): Secondary | ICD-10-CM

## 2019-10-31 MED ORDER — SODIUM CHLORIDE 0.9 % IV SOLN
Freq: Once | INTRAVENOUS | Status: AC
  Filled 2019-10-31: qty 250

## 2019-10-31 MED ORDER — SODIUM CHLORIDE 0.9 % IV SOLN
200.0000 mg | Freq: Once | INTRAVENOUS | Status: AC
  Administered 2019-10-31: 200 mg via INTRAVENOUS
  Filled 2019-10-31: qty 200

## 2019-10-31 MED ORDER — METHYLPREDNISOLONE SODIUM SUCC 40 MG IJ SOLR
20.0000 mg | Freq: Once | INTRAMUSCULAR | Status: AC
  Administered 2019-10-31: 20 mg via INTRAVENOUS

## 2019-10-31 MED ORDER — FAMOTIDINE IN NACL 20-0.9 MG/50ML-% IV SOLN
INTRAVENOUS | Status: AC
  Filled 2019-10-31: qty 50

## 2019-10-31 MED ORDER — METHYLPREDNISOLONE SODIUM SUCC 40 MG IJ SOLR
INTRAMUSCULAR | Status: AC
  Filled 2019-10-31: qty 1

## 2019-10-31 MED ORDER — FAMOTIDINE IN NACL 20-0.9 MG/50ML-% IV SOLN
20.0000 mg | Freq: Once | INTRAVENOUS | Status: AC
  Administered 2019-10-31: 20 mg via INTRAVENOUS

## 2019-10-31 NOTE — Patient Instructions (Signed)

## 2019-11-07 ENCOUNTER — Inpatient Hospital Stay: Payer: BC Managed Care – PPO

## 2019-11-07 ENCOUNTER — Other Ambulatory Visit: Payer: Self-pay

## 2019-11-07 VITALS — BP 155/73 | HR 80 | Temp 98.9°F | Resp 16

## 2019-11-07 DIAGNOSIS — D500 Iron deficiency anemia secondary to blood loss (chronic): Secondary | ICD-10-CM

## 2019-11-07 MED ORDER — SODIUM CHLORIDE 0.9 % IV SOLN
INTRAVENOUS | Status: DC
  Filled 2019-11-07 (×2): qty 250

## 2019-11-07 MED ORDER — FAMOTIDINE IN NACL 20-0.9 MG/50ML-% IV SOLN
INTRAVENOUS | Status: AC
  Filled 2019-11-07: qty 50

## 2019-11-07 MED ORDER — METHYLPREDNISOLONE SODIUM SUCC 40 MG IJ SOLR
INTRAMUSCULAR | Status: AC
  Filled 2019-11-07: qty 1

## 2019-11-07 MED ORDER — METHYLPREDNISOLONE SODIUM SUCC 40 MG IJ SOLR
20.0000 mg | Freq: Once | INTRAMUSCULAR | Status: AC
  Administered 2019-11-07: 20 mg via INTRAVENOUS

## 2019-11-07 MED ORDER — SODIUM CHLORIDE 0.9 % IV SOLN
200.0000 mg | Freq: Once | INTRAVENOUS | Status: AC
  Administered 2019-11-07: 200 mg via INTRAVENOUS
  Filled 2019-11-07: qty 10

## 2019-11-07 MED ORDER — FAMOTIDINE IN NACL 20-0.9 MG/50ML-% IV SOLN
20.0000 mg | Freq: Once | INTRAVENOUS | Status: AC
  Administered 2019-11-07: 20 mg via INTRAVENOUS

## 2019-11-07 NOTE — Patient Instructions (Signed)

## 2019-11-14 ENCOUNTER — Other Ambulatory Visit: Payer: Self-pay

## 2019-11-14 ENCOUNTER — Inpatient Hospital Stay: Payer: BC Managed Care – PPO | Attending: Hematology and Oncology

## 2019-11-14 VITALS — BP 152/67 | HR 82 | Temp 98.4°F | Resp 17

## 2019-11-14 DIAGNOSIS — N920 Excessive and frequent menstruation with regular cycle: Secondary | ICD-10-CM | POA: Diagnosis present

## 2019-11-14 DIAGNOSIS — D500 Iron deficiency anemia secondary to blood loss (chronic): Secondary | ICD-10-CM | POA: Insufficient documentation

## 2019-11-14 MED ORDER — FAMOTIDINE IN NACL 20-0.9 MG/50ML-% IV SOLN
20.0000 mg | Freq: Once | INTRAVENOUS | Status: AC
  Administered 2019-11-14: 20 mg via INTRAVENOUS

## 2019-11-14 MED ORDER — FAMOTIDINE IN NACL 20-0.9 MG/50ML-% IV SOLN
INTRAVENOUS | Status: AC
  Filled 2019-11-14: qty 50

## 2019-11-14 MED ORDER — METHYLPREDNISOLONE SODIUM SUCC 40 MG IJ SOLR
20.0000 mg | Freq: Once | INTRAMUSCULAR | Status: AC
  Administered 2019-11-14: 20 mg via INTRAVENOUS

## 2019-11-14 MED ORDER — METHYLPREDNISOLONE SODIUM SUCC 40 MG IJ SOLR
INTRAMUSCULAR | Status: AC
  Filled 2019-11-14: qty 1

## 2019-11-14 MED ORDER — SODIUM CHLORIDE 0.9 % IV SOLN
200.0000 mg | Freq: Once | INTRAVENOUS | Status: AC
  Administered 2019-11-14: 200 mg via INTRAVENOUS
  Filled 2019-11-14: qty 200

## 2019-11-14 NOTE — Patient Instructions (Signed)

## 2019-11-21 ENCOUNTER — Other Ambulatory Visit: Payer: Self-pay

## 2019-11-21 ENCOUNTER — Inpatient Hospital Stay: Payer: BC Managed Care – PPO

## 2019-11-21 VITALS — BP 145/61 | HR 80 | Temp 98.5°F | Resp 16

## 2019-11-21 DIAGNOSIS — D500 Iron deficiency anemia secondary to blood loss (chronic): Secondary | ICD-10-CM | POA: Diagnosis not present

## 2019-11-21 MED ORDER — SODIUM CHLORIDE 0.9 % IV SOLN
INTRAVENOUS | Status: DC
  Filled 2019-11-21: qty 250

## 2019-11-21 MED ORDER — FAMOTIDINE IN NACL 20-0.9 MG/50ML-% IV SOLN
INTRAVENOUS | Status: AC
  Filled 2019-11-21: qty 50

## 2019-11-21 MED ORDER — METHYLPREDNISOLONE SODIUM SUCC 40 MG IJ SOLR
20.0000 mg | Freq: Once | INTRAMUSCULAR | Status: AC
  Administered 2019-11-21: 20 mg via INTRAVENOUS

## 2019-11-21 MED ORDER — METHYLPREDNISOLONE SODIUM SUCC 40 MG IJ SOLR
INTRAMUSCULAR | Status: AC
  Filled 2019-11-21: qty 1

## 2019-11-21 MED ORDER — FAMOTIDINE IN NACL 20-0.9 MG/50ML-% IV SOLN
20.0000 mg | Freq: Once | INTRAVENOUS | Status: AC
  Administered 2019-11-21: 20 mg via INTRAVENOUS

## 2019-11-21 MED ORDER — SODIUM CHLORIDE 0.9 % IV SOLN
200.0000 mg | Freq: Once | INTRAVENOUS | Status: AC
  Administered 2019-11-21: 200 mg via INTRAVENOUS
  Filled 2019-11-21: qty 200

## 2019-11-21 NOTE — Patient Instructions (Signed)

## 2019-12-19 ENCOUNTER — Telehealth: Payer: Self-pay | Admitting: Hematology and Oncology

## 2019-12-19 ENCOUNTER — Inpatient Hospital Stay: Payer: BC Managed Care – PPO

## 2019-12-19 ENCOUNTER — Inpatient Hospital Stay (HOSPITAL_BASED_OUTPATIENT_CLINIC_OR_DEPARTMENT_OTHER): Payer: BC Managed Care – PPO | Admitting: Hematology and Oncology

## 2019-12-19 ENCOUNTER — Other Ambulatory Visit: Payer: Self-pay

## 2019-12-19 ENCOUNTER — Inpatient Hospital Stay: Payer: BC Managed Care – PPO | Attending: Hematology and Oncology

## 2019-12-19 ENCOUNTER — Encounter: Payer: Self-pay | Admitting: Hematology and Oncology

## 2019-12-19 VITALS — BP 141/68 | HR 79 | Temp 98.5°F | Resp 17

## 2019-12-19 DIAGNOSIS — N920 Excessive and frequent menstruation with regular cycle: Secondary | ICD-10-CM | POA: Insufficient documentation

## 2019-12-19 DIAGNOSIS — D500 Iron deficiency anemia secondary to blood loss (chronic): Secondary | ICD-10-CM

## 2019-12-19 DIAGNOSIS — T50905A Adverse effect of unspecified drugs, medicaments and biological substances, initial encounter: Secondary | ICD-10-CM | POA: Diagnosis not present

## 2019-12-19 DIAGNOSIS — R635 Abnormal weight gain: Secondary | ICD-10-CM | POA: Insufficient documentation

## 2019-12-19 DIAGNOSIS — R5383 Other fatigue: Secondary | ICD-10-CM

## 2019-12-19 DIAGNOSIS — Z79899 Other long term (current) drug therapy: Secondary | ICD-10-CM | POA: Diagnosis not present

## 2019-12-19 LAB — CBC WITH DIFFERENTIAL/PLATELET
Abs Immature Granulocytes: 0.01 10*3/uL (ref 0.00–0.07)
Basophils Absolute: 0 10*3/uL (ref 0.0–0.1)
Basophils Relative: 1 %
Eosinophils Absolute: 0.2 10*3/uL (ref 0.0–0.5)
Eosinophils Relative: 2 %
HCT: 36.6 % (ref 36.0–46.0)
Hemoglobin: 10.6 g/dL — ABNORMAL LOW (ref 12.0–15.0)
Immature Granulocytes: 0 %
Lymphocytes Relative: 31 %
Lymphs Abs: 2.1 10*3/uL (ref 0.7–4.0)
MCH: 22.1 pg — ABNORMAL LOW (ref 26.0–34.0)
MCHC: 29 g/dL — ABNORMAL LOW (ref 30.0–36.0)
MCV: 76.4 fL — ABNORMAL LOW (ref 80.0–100.0)
Monocytes Absolute: 0.3 10*3/uL (ref 0.1–1.0)
Monocytes Relative: 4 %
Neutro Abs: 4.2 10*3/uL (ref 1.7–7.7)
Neutrophils Relative %: 62 %
Platelets: 353 10*3/uL (ref 150–400)
RBC: 4.79 MIL/uL (ref 3.87–5.11)
RDW: 17.4 % — ABNORMAL HIGH (ref 11.5–15.5)
WBC: 6.8 10*3/uL (ref 4.0–10.5)
nRBC: 0 % (ref 0.0–0.2)

## 2019-12-19 LAB — IRON AND TIBC
Iron: 55 ug/dL (ref 41–142)
Saturation Ratios: 16 % — ABNORMAL LOW (ref 21–57)
TIBC: 340 ug/dL (ref 236–444)
UIBC: 285 ug/dL (ref 120–384)

## 2019-12-19 LAB — FERRITIN: Ferritin: 49 ng/mL (ref 11–307)

## 2019-12-19 MED ORDER — DIPHENHYDRAMINE HCL 25 MG PO CAPS
25.0000 mg | ORAL_CAPSULE | Freq: Once | ORAL | Status: AC
  Administered 2019-12-19: 25 mg via ORAL

## 2019-12-19 MED ORDER — DIPHENHYDRAMINE HCL 25 MG PO CAPS
ORAL_CAPSULE | ORAL | Status: AC
  Filled 2019-12-19: qty 1

## 2019-12-19 MED ORDER — FAMOTIDINE IN NACL 20-0.9 MG/50ML-% IV SOLN
INTRAVENOUS | Status: AC
  Filled 2019-12-19: qty 50

## 2019-12-19 MED ORDER — LORATADINE 10 MG PO TABS
ORAL_TABLET | ORAL | Status: AC
  Filled 2019-12-19: qty 1

## 2019-12-19 MED ORDER — LORATADINE 10 MG PO TABS
10.0000 mg | ORAL_TABLET | Freq: Once | ORAL | Status: AC
  Administered 2019-12-19: 10 mg via ORAL

## 2019-12-19 MED ORDER — SODIUM CHLORIDE 0.9 % IV SOLN
INTRAVENOUS | Status: DC
  Filled 2019-12-19: qty 250

## 2019-12-19 MED ORDER — METHYLPREDNISOLONE SODIUM SUCC 40 MG IJ SOLR
INTRAMUSCULAR | Status: AC
  Filled 2019-12-19: qty 1

## 2019-12-19 MED ORDER — SODIUM CHLORIDE 0.9 % IV SOLN
300.0000 mg | Freq: Once | INTRAVENOUS | Status: AC
  Administered 2019-12-19: 300 mg via INTRAVENOUS
  Filled 2019-12-19: qty 10

## 2019-12-19 MED ORDER — FAMOTIDINE IN NACL 20-0.9 MG/50ML-% IV SOLN
20.0000 mg | Freq: Once | INTRAVENOUS | Status: AC
  Administered 2019-12-19: 20 mg via INTRAVENOUS

## 2019-12-19 NOTE — Patient Instructions (Signed)

## 2019-12-19 NOTE — Assessment & Plan Note (Signed)
She is still quite symptomatic from anemia despite improvement of her hemoglobin levels She tolerated IV iron sucrose well without side effects Unfortunately, she has profound weight gain from steroids I plan to eliminate premedication steroid with iron sucrose and to substitute with oral Benadryl, Pepcid and others  The most likely cause of her anemia is due to chronic blood loss/malabsorption syndrome. We discussed some of the risks, benefits, and alternatives of intravenous iron infusions. The patient is symptomatic from anemia and the iron level is critically low. She tolerated oral iron supplement poorly and desires to achieved higher levels of iron faster for adequate hematopoesis. Some of the side-effects to be expected including risks of infusion reactions, phlebitis, headaches, nausea and fatigue.  The patient is willing to proceed. Patient education material was dispensed.  Goal is to keep ferritin level greater than 50 and resolution of anemia I plan to increase the dose of IV iron sucrose to 300 mg Given her recent severe menorrhagia, I recommend a few more doses of IV iron after today and then see her back in early January for further follow-up

## 2019-12-19 NOTE — Progress Notes (Signed)
[place] OFFICE PROGRESS NOTE  [name] Morning, DO  ASSESSMENT & PLAN:  Iron deficiency anemia She is still quite symptomatic from anemia despite improvement of her hemoglobin levels She tolerated IV iron sucrose well without side effects Unfortunately, she has profound weight gain from steroids I plan to eliminate premedication steroid with iron sucrose and to substitute with oral Benadryl, Pepcid and others  The most likely cause of her anemia is due to chronic blood loss/malabsorption syndrome. We discussed some of the risks, benefits, and alternatives of intravenous iron infusions. The patient is symptomatic from anemia and the iron level is critically low. She tolerated oral iron supplement poorly and desires to achieved higher levels of iron faster for adequate hematopoesis. Some of the side-effects to be expected including risks of infusion reactions, phlebitis, headaches, nausea and fatigue.  The patient is willing to proceed. Patient education material was dispensed.  Goal is to keep ferritin level greater than 50 and resolution of anemia I plan to increase the dose of IV iron sucrose to 300 mg Given her recent severe menorrhagia, I recommend a few more doses of IV iron after today and then see her back in early January for further follow-up  Menorrhagia with regular cycle She have severe, recurrent menorrhagia I recommend the patient to get an appointment to see her gynecologist soon for further evaluation and management of heavy menstruation, which is the most likely cause of her severe anemia If she is not able to see her gynecologist, I also recommend consideration to discuss this with her primary care doctor for further management  Weight gain due to medication She tolerated iron sucrose well I will eliminate steroids I suspect her recent weight gain is due to this   No orders of the defined types were placed in this encounter.   The total time spent in  the appointment was 20 minutes encounter with patients including review of chart and various tests results, discussions about plan of care and coordination of care plan   All questions were answered. The patient knows to call the clinic with any problems, questions or concerns. No barriers to learning was detected.    [name], MD 11/10/202110:28 AM  INTERVAL HISTORY: [name] [name] 44 y.o. female returns for further follow-up for iron deficiency anemia secondary to menorrhagia Since last time I saw her, she have to more heavy cycles of menstruation She is not able to get appointment made to see a gynecologist The patient denies any recent signs or symptoms of bleeding such as spontaneous epistaxis, hematuria or hematochezia.  She complained of excessive fatigue, mild dizziness and intermittent headaches She denies reaction to IV iron sucrose Unfortunately, she has gained a lot of weight recently  SUMMARY OF HEMATOLOGIC HISTORY:  She was found to have abnormal CBC from recent blood work for evaluation of fatigue. She denies recent chest pain on exertion, pre-syncopal episodes, or palpitations. She does complain of leg cramps, dizziness, shortness of breath on minimal exertion, and frequent headaches. Recent CBC done last month in May 2015 shows significant anemia with hemoglobin 7.9, MCV of 55 and platelet count of 409. She received one dose of intravenous iron on 07/23/2013. After infusion, she complained of scratchy throat and developed hives. Subsequently, she received premedication with Solu-Medrol and she was able to complete her treatment in July 2015 She received further IV iron in June 2017 for recurrent iron deficiency anemia In February 2019, EGD and colonoscopy excluded GI blood loss On 06/29/2017,  she underwent D&C and polypectomy She has received several doses of intravenous iron in 2019 In 2021, she received blood transfusion and intravenous iron due to severe  menorrhagia  I have reviewed the past medical history, past surgical history, social history and family history with the patient and they are unchanged from previous note.  ALLERGIES:  is allergic to feraheme [ferumoxytol].  MEDICATIONS:  Current Outpatient Medications  Medication Sig Dispense Refill  . fluticasone (FLONASE) 50 MCG/ACT nasal spray Place 2 sprays into both nostrils daily. 1 g 0  . methocarbamol (ROBAXIN) 500 MG tablet Take 1 tablet (500 mg total) by mouth 2 (two) times daily as needed for muscle spasms. 20 tablet 0  . prochlorperazine (COMPAZINE) 10 MG tablet Take 1 tablet (10 mg total) by mouth every 6 (six) hours as needed for nausea or vomiting. 30 tablet 0   No current facility-administered medications for this visit.   Facility-Administered Medications Ordered in Other Visits  Medication Dose Route Frequency Provider Last Rate Last Admin  . 0.9 %  sodium chloride infusion   Intravenous Continuous Alvy Bimler, Sandar Krinke, MD      . diphenhydrAMINE (BENADRYL) capsule 25 mg  25 mg Oral Once Alvy Bimler, Kasheena Sambrano, MD      . famotidine (PEPCID) IVPB 20 mg premix  20 mg Intravenous Once Alvy Bimler, Praneeth Bussey, MD      . iron sucrose (VENOFER) 300 mg in sodium chloride 0.9 % 250 mL IVPB  300 mg Intravenous Once Alvy Bimler, Laakea Pereira, MD      . loratadine (CLARITIN) tablet 10 mg  10 mg Oral Once [name], MD         REVIEW OF SYSTEMS:   Constitutional: Denies fevers, chills or night sweats Eyes: Denies blurriness of vision Ears, nose, mouth, throat, and face: Denies mucositis or sore throat Respiratory: Denies cough, dyspnea or wheezes Cardiovascular: Denies palpitation, chest discomfort or lower extremity swelling Gastrointestinal:  Denies nausea, heartburn or change in bowel habits Skin: Denies abnormal skin rashes Lymphatics: Denies new lymphadenopathy or easy bruising Neurological:Denies numbness, tingling or new weaknesses Behavioral/Psych: Mood is stable, no new changes  All other systems were reviewed  with the patient and are negative.  PHYSICAL EXAMINATION: ECOG PERFORMANCE STATUS: 1 - Symptomatic but completely ambulatory  Vitals:   12/19/19 0948  BP: (!) 153/73  Pulse: 86  Resp: 18  Temp: 98.1 F (36.7 C)  SpO2: 100%   Filed Weights   12/19/19 0948  Weight: 273 lb 14.4 oz (124.2 kg)    GENERAL:alert, no distress and comfortable NEURO: alert & oriented x 3 with fluent speech, no focal motor/sensory deficits  LABORATORY DATA:  I have reviewed the data as listed     Component Value Date/Time   NA 139 02/11/2017 1000   K 3.4 (L) 02/11/2017 1000   CL 103 02/28/2012 1607   CO2 26 02/11/2017 1000   GLUCOSE 108 02/11/2017 1000   BUN 12.0 02/11/2017 1000   CREATININE 0.7 02/11/2017 1000   CALCIUM 9.1 02/11/2017 1000   PROT 7.3 02/11/2017 1000   ALBUMIN 3.5 02/11/2017 1000   AST 15 02/11/2017 1000   ALT 15 02/11/2017 1000   ALKPHOS 48 02/11/2017 1000   BILITOT <0.22 02/11/2017 1000    No results found for: SPEP, UPEP  Lab Results  Component Value Date   WBC 6.8 12/19/2019   NEUTROABS 4.2 12/19/2019   HGB 10.6 (L) 12/19/2019   HCT 36.6 12/19/2019   MCV 76.4 (L) 12/19/2019   PLT 353 12/19/2019  Chemistry      Component Value Date/Time   NA 139 02/11/2017 1000   K 3.4 (L) 02/11/2017 1000   CL 103 02/28/2012 1607   CO2 26 02/11/2017 1000   BUN 12.0 02/11/2017 1000   CREATININE 0.7 02/11/2017 1000      Component Value Date/Time   CALCIUM 9.1 02/11/2017 1000   ALKPHOS 48 02/11/2017 1000   AST 15 02/11/2017 1000   ALT 15 02/11/2017 1000   BILITOT <0.22 02/11/2017 1000

## 2019-12-19 NOTE — Assessment & Plan Note (Signed)
She tolerated iron sucrose well I will eliminate steroids I suspect her recent weight gain is due to this

## 2019-12-19 NOTE — Telephone Encounter (Signed)
Scheduled appointments per 11/10 sch msg. Spoke to patient who is aware of appointment date and time.

## 2019-12-19 NOTE — Assessment & Plan Note (Signed)
She have severe, recurrent menorrhagia I recommend the patient to get an appointment to see her gynecologist soon for further evaluation and management of heavy menstruation, which is the most likely cause of her severe anemia If she is not able to see her gynecologist, I also recommend consideration to discuss this with her primary care doctor for further management

## 2019-12-26 ENCOUNTER — Other Ambulatory Visit: Payer: Self-pay

## 2019-12-26 ENCOUNTER — Inpatient Hospital Stay: Payer: BC Managed Care – PPO

## 2019-12-26 VITALS — BP 150/66 | HR 78 | Temp 98.6°F | Resp 16

## 2019-12-26 DIAGNOSIS — D500 Iron deficiency anemia secondary to blood loss (chronic): Secondary | ICD-10-CM

## 2019-12-26 MED ORDER — LORATADINE 10 MG PO TABS
ORAL_TABLET | ORAL | Status: AC
  Filled 2019-12-26: qty 1

## 2019-12-26 MED ORDER — DIPHENHYDRAMINE HCL 25 MG PO CAPS
ORAL_CAPSULE | ORAL | Status: AC
  Filled 2019-12-26: qty 1

## 2019-12-26 MED ORDER — SODIUM CHLORIDE 0.9 % IV SOLN
300.0000 mg | Freq: Once | INTRAVENOUS | Status: AC
  Administered 2019-12-26: 300 mg via INTRAVENOUS
  Filled 2019-12-26: qty 5

## 2019-12-26 MED ORDER — FAMOTIDINE IN NACL 20-0.9 MG/50ML-% IV SOLN
20.0000 mg | Freq: Once | INTRAVENOUS | Status: AC
  Administered 2019-12-26: 20 mg via INTRAVENOUS

## 2019-12-26 MED ORDER — LORATADINE 10 MG PO TABS
10.0000 mg | ORAL_TABLET | Freq: Once | ORAL | Status: AC
  Administered 2019-12-26: 10 mg via ORAL

## 2019-12-26 MED ORDER — DIPHENHYDRAMINE HCL 25 MG PO CAPS
25.0000 mg | ORAL_CAPSULE | Freq: Once | ORAL | Status: AC
  Administered 2019-12-26: 25 mg via ORAL

## 2019-12-26 MED ORDER — FAMOTIDINE IN NACL 20-0.9 MG/50ML-% IV SOLN
INTRAVENOUS | Status: AC
  Filled 2019-12-26: qty 50

## 2019-12-26 NOTE — Patient Instructions (Signed)

## 2020-01-04 ENCOUNTER — Ambulatory Visit: Payer: BC Managed Care – PPO

## 2020-01-04 ENCOUNTER — Inpatient Hospital Stay: Payer: BC Managed Care – PPO

## 2020-01-09 ENCOUNTER — Other Ambulatory Visit: Payer: Self-pay

## 2020-01-09 ENCOUNTER — Inpatient Hospital Stay: Payer: BC Managed Care – PPO | Attending: Hematology and Oncology

## 2020-01-09 VITALS — BP 148/71 | HR 78 | Temp 98.5°F | Resp 16

## 2020-01-09 DIAGNOSIS — N920 Excessive and frequent menstruation with regular cycle: Secondary | ICD-10-CM | POA: Diagnosis present

## 2020-01-09 DIAGNOSIS — Z79899 Other long term (current) drug therapy: Secondary | ICD-10-CM | POA: Diagnosis not present

## 2020-01-09 DIAGNOSIS — D500 Iron deficiency anemia secondary to blood loss (chronic): Secondary | ICD-10-CM | POA: Diagnosis not present

## 2020-01-09 MED ORDER — DIPHENHYDRAMINE HCL 25 MG PO CAPS
25.0000 mg | ORAL_CAPSULE | Freq: Once | ORAL | Status: AC
  Administered 2020-01-09: 25 mg via ORAL

## 2020-01-09 MED ORDER — FAMOTIDINE IN NACL 20-0.9 MG/50ML-% IV SOLN
20.0000 mg | Freq: Once | INTRAVENOUS | Status: AC
  Administered 2020-01-09: 20 mg via INTRAVENOUS

## 2020-01-09 MED ORDER — LORATADINE 10 MG PO TABS
10.0000 mg | ORAL_TABLET | Freq: Once | ORAL | Status: AC
  Administered 2020-01-09: 10 mg via ORAL

## 2020-01-09 MED ORDER — SODIUM CHLORIDE 0.9 % IV SOLN
300.0000 mg | Freq: Once | INTRAVENOUS | Status: AC
  Administered 2020-01-09: 300 mg via INTRAVENOUS
  Filled 2020-01-09: qty 10

## 2020-01-09 MED ORDER — LORATADINE 10 MG PO TABS
ORAL_TABLET | ORAL | Status: AC
  Filled 2020-01-09: qty 1

## 2020-01-09 MED ORDER — SODIUM CHLORIDE 0.9 % IV SOLN
INTRAVENOUS | Status: DC
  Filled 2020-01-09: qty 250

## 2020-01-09 MED ORDER — DIPHENHYDRAMINE HCL 25 MG PO CAPS
ORAL_CAPSULE | ORAL | Status: AC
  Filled 2020-01-09: qty 1

## 2020-01-09 MED ORDER — FAMOTIDINE IN NACL 20-0.9 MG/50ML-% IV SOLN
INTRAVENOUS | Status: AC
  Filled 2020-01-09: qty 50

## 2020-01-09 NOTE — Progress Notes (Signed)
Pt waited approximately 10 minutes post infusion, did not want to wait full 30 minutes.  VSS.  Stable upon discharge.  Ambulatory to lobby.

## 2020-01-09 NOTE — Patient Instructions (Signed)

## 2020-02-20 ENCOUNTER — Encounter: Payer: Self-pay | Admitting: Hematology and Oncology

## 2020-02-20 ENCOUNTER — Inpatient Hospital Stay: Payer: BC Managed Care – PPO

## 2020-02-20 ENCOUNTER — Other Ambulatory Visit: Payer: Self-pay

## 2020-02-20 ENCOUNTER — Other Ambulatory Visit: Payer: Self-pay | Admitting: Hematology and Oncology

## 2020-02-20 ENCOUNTER — Inpatient Hospital Stay: Payer: BC Managed Care – PPO | Attending: Hematology and Oncology

## 2020-02-20 ENCOUNTER — Inpatient Hospital Stay (HOSPITAL_BASED_OUTPATIENT_CLINIC_OR_DEPARTMENT_OTHER): Payer: BC Managed Care – PPO | Admitting: Hematology and Oncology

## 2020-02-20 VITALS — BP 144/62 | HR 75

## 2020-02-20 DIAGNOSIS — D500 Iron deficiency anemia secondary to blood loss (chronic): Secondary | ICD-10-CM

## 2020-02-20 DIAGNOSIS — N920 Excessive and frequent menstruation with regular cycle: Secondary | ICD-10-CM

## 2020-02-20 DIAGNOSIS — R5383 Other fatigue: Secondary | ICD-10-CM

## 2020-02-20 LAB — CBC WITH DIFFERENTIAL/PLATELET
Abs Immature Granulocytes: 0.03 10*3/uL (ref 0.00–0.07)
Basophils Absolute: 0.1 10*3/uL (ref 0.0–0.1)
Basophils Relative: 1 %
Eosinophils Absolute: 0.2 10*3/uL (ref 0.0–0.5)
Eosinophils Relative: 2 %
HCT: 33.3 % — ABNORMAL LOW (ref 36.0–46.0)
Hemoglobin: 9.9 g/dL — ABNORMAL LOW (ref 12.0–15.0)
Immature Granulocytes: 0 %
Lymphocytes Relative: 36 %
Lymphs Abs: 2.7 10*3/uL (ref 0.7–4.0)
MCH: 22.3 pg — ABNORMAL LOW (ref 26.0–34.0)
MCHC: 29.7 g/dL — ABNORMAL LOW (ref 30.0–36.0)
MCV: 75.2 fL — ABNORMAL LOW (ref 80.0–100.0)
Monocytes Absolute: 0.4 10*3/uL (ref 0.1–1.0)
Monocytes Relative: 5 %
Neutro Abs: 4.2 10*3/uL (ref 1.7–7.7)
Neutrophils Relative %: 56 %
Platelets: 279 10*3/uL (ref 150–400)
RBC: 4.43 MIL/uL (ref 3.87–5.11)
RDW: 16.6 % — ABNORMAL HIGH (ref 11.5–15.5)
WBC: 7.6 10*3/uL (ref 4.0–10.5)
nRBC: 0 % (ref 0.0–0.2)

## 2020-02-20 LAB — IRON AND TIBC
Iron: 50 ug/dL (ref 41–142)
Saturation Ratios: 16 % — ABNORMAL LOW (ref 21–57)
TIBC: 316 ug/dL (ref 236–444)
UIBC: 267 ug/dL (ref 120–384)

## 2020-02-20 LAB — FERRITIN: Ferritin: 47 ng/mL (ref 11–307)

## 2020-02-20 MED ORDER — LORATADINE 10 MG PO TABS
10.0000 mg | ORAL_TABLET | Freq: Once | ORAL | Status: AC
  Administered 2020-02-20: 10 mg via ORAL

## 2020-02-20 MED ORDER — SODIUM CHLORIDE 0.9 % IV SOLN
300.0000 mg | Freq: Once | INTRAVENOUS | Status: AC
  Administered 2020-02-20: 300 mg via INTRAVENOUS
  Filled 2020-02-20: qty 15

## 2020-02-20 MED ORDER — FAMOTIDINE IN NACL 20-0.9 MG/50ML-% IV SOLN
INTRAVENOUS | Status: AC
  Filled 2020-02-20: qty 50

## 2020-02-20 MED ORDER — DIPHENHYDRAMINE HCL 25 MG PO CAPS
25.0000 mg | ORAL_CAPSULE | Freq: Once | ORAL | Status: AC
  Administered 2020-02-20: 25 mg via ORAL

## 2020-02-20 MED ORDER — FAMOTIDINE IN NACL 20-0.9 MG/50ML-% IV SOLN
20.0000 mg | Freq: Once | INTRAVENOUS | Status: AC
  Administered 2020-02-20: 20 mg via INTRAVENOUS

## 2020-02-20 MED ORDER — DIPHENHYDRAMINE HCL 25 MG PO CAPS
ORAL_CAPSULE | ORAL | Status: AC
  Filled 2020-02-20: qty 1

## 2020-02-20 MED ORDER — SODIUM CHLORIDE 0.9 % IV SOLN
Freq: Once | INTRAVENOUS | Status: AC
  Filled 2020-02-20: qty 250

## 2020-02-20 MED ORDER — LORATADINE 10 MG PO TABS
ORAL_TABLET | ORAL | Status: AC
  Filled 2020-02-20: qty 1

## 2020-02-20 NOTE — Assessment & Plan Note (Signed)
She is known to have uterine fibroids She has significant menorrhagia on the monthly basis She has appointment to see gynecologist soon I recommend a trial of Lupron to see if we can start her menses and she is in agreement to proceed We discussed the risk, benefits, side effects of Lupron

## 2020-02-20 NOTE — Progress Notes (Signed)
Pt declined to stay for 30 min post infusion, VS stable, tolerated well.

## 2020-02-20 NOTE — Patient Instructions (Signed)

## 2020-02-20 NOTE — Progress Notes (Signed)
[place] OFFICE PROGRESS NOTE  [name] Morning, DO  ASSESSMENT & PLAN:  Iron deficiency anemia She is still quite symptomatic from anemia despite improvement of her hemoglobin levels She tolerated IV iron sucrose well without side effects  The most likely cause of her anemia is due to chronic blood loss/malabsorption syndrome. We discussed some of the risks, benefits, and alternatives of intravenous iron infusions. The patient is symptomatic from anemia and the iron level is critically low. She tolerated oral iron supplement poorly and desires to achieved higher levels of iron faster for adequate hematopoesis. Some of the side-effects to be expected including risks of infusion reactions, phlebitis, headaches, nausea and fatigue.  The patient is willing to proceed. Patient education material was dispensed.  Goal is to keep ferritin level greater than 50 and resolution of anemia I plan to increase the dose of IV iron sucrose to 300 mg Given her recent severe menorrhagia, I recommend a few more doses of IV iron after today and then see her back in next month for further follow-up  I also recommend a trial of Lupron injection to see if we can start her menstruation  Menorrhagia with regular cycle She is known to have uterine fibroids She has significant menorrhagia on the monthly basis She has appointment to see gynecologist soon I recommend a trial of Lupron to see if we can start her menses and she is in agreement to proceed We discussed the risk, benefits, side effects of Lupron   No orders of the defined types were placed in this encounter.   The total time spent in the appointment was 20 minutes encounter with patients including review of chart and various tests results, discussions about plan of care and coordination of care plan   All questions were answered. The patient knows to call the clinic with any problems, questions or concerns. No barriers to learning was  detected.    [name], MD 1/12/20223:21 PM  INTERVAL HISTORY: [name] [name] 45 y.o. female returns for further follow-up on chronic iron deficiency anemia due to menorrhagia She had 2 heavy menses recently causing significant anemia She complained of excessive fatigue The patient denies any recent signs or symptoms of bleeding such as spontaneous epistaxis, hematuria or hematochezia. She has no infusion reactions with IV iron sucrose  SUMMARY OF HEMATOLOGIC HISTORY:  She was found to have abnormal CBC from recent blood work for evaluation of fatigue. She denies recent chest pain on exertion, pre-syncopal episodes, or palpitations. She does complain of leg cramps, dizziness, shortness of breath on minimal exertion, and frequent headaches. Recent CBC done last month in May 2015 shows significant anemia with hemoglobin 7.9, MCV of 55 and platelet count of 409. She received one dose of intravenous iron on 07/23/2013. After infusion, she complained of scratchy throat and developed hives. Subsequently, she received premedication with Solu-Medrol and she was able to complete her treatment in July 2015 She received further IV iron in June 2017 for recurrent iron deficiency anemia In February 2019, EGD and colonoscopy excluded GI blood loss On 06/29/2017, she underwent D&C and polypectomy She has received several doses of intravenous iron in 2019 In 2021, she received blood transfusion and intravenous iron due to severe menorrhagia  I have reviewed the past medical history, past surgical history, social history and family history with the patient and they are unchanged from previous note.  ALLERGIES:  is allergic to feraheme [ferumoxytol].  MEDICATIONS:  Current Outpatient Medications  Medication Sig  Dispense Refill  . fluticasone (FLONASE) 50 MCG/ACT nasal spray Place 2 sprays into both nostrils daily. 1 g 0  . methocarbamol (ROBAXIN) 500 MG tablet Take 1 tablet (500 mg total) by mouth  2 (two) times daily as needed for muscle spasms. 20 tablet 0  . prochlorperazine (COMPAZINE) 10 MG tablet Take 1 tablet (10 mg total) by mouth every 6 (six) hours as needed for nausea or vomiting. 30 tablet 0   No current facility-administered medications for this visit.     REVIEW OF SYSTEMS:   Constitutional: Denies fevers, chills or night sweats Eyes: Denies blurriness of vision Ears, nose, mouth, throat, and face: Denies mucositis or sore throat Respiratory: Denies cough, dyspnea or wheezes Cardiovascular: Denies palpitation, chest discomfort or lower extremity swelling Gastrointestinal:  Denies nausea, heartburn or change in bowel habits Skin: Denies abnormal skin rashes Lymphatics: Denies new lymphadenopathy or easy bruising Neurological:Denies numbness, tingling or new weaknesses Behavioral/Psych: Mood is stable, no new changes  All other systems were reviewed with the patient and are negative.  PHYSICAL EXAMINATION: ECOG PERFORMANCE STATUS: 1 - Symptomatic but completely ambulatory  Vitals:   02/20/20 1003  BP: (!) 150/79  Pulse: 83  Resp: 18  Temp: (!) 97.4 F (36.3 C)  SpO2: 100%   Filed Weights   02/20/20 1003  Weight: 281 lb 6.4 oz (127.6 kg)    GENERAL:alert, no distress and comfortable NEURO: alert & oriented x 3 with fluent speech, no focal motor/sensory deficits  LABORATORY DATA:  I have reviewed the data as listed     Component Value Date/Time   NA 139 02/11/2017 1000   K 3.4 (L) 02/11/2017 1000   CL 103 02/28/2012 1607   CO2 26 02/11/2017 1000   GLUCOSE 108 02/11/2017 1000   BUN 12.0 02/11/2017 1000   CREATININE 0.7 02/11/2017 1000   CALCIUM 9.1 02/11/2017 1000   PROT 7.3 02/11/2017 1000   ALBUMIN 3.5 02/11/2017 1000   AST 15 02/11/2017 1000   ALT 15 02/11/2017 1000   ALKPHOS 48 02/11/2017 1000   BILITOT <0.22 02/11/2017 1000    No results found for: SPEP, UPEP  Lab Results  Component Value Date   WBC 7.6 02/20/2020   NEUTROABS 4.2  02/20/2020   HGB 9.9 (L) 02/20/2020   HCT 33.3 (L) 02/20/2020   MCV 75.2 (L) 02/20/2020   PLT 279 02/20/2020      Chemistry      Component Value Date/Time   NA 139 02/11/2017 1000   K 3.4 (L) 02/11/2017 1000   CL 103 02/28/2012 1607   CO2 26 02/11/2017 1000   BUN 12.0 02/11/2017 1000   CREATININE 0.7 02/11/2017 1000      Component Value Date/Time   CALCIUM 9.1 02/11/2017 1000   ALKPHOS 48 02/11/2017 1000   AST 15 02/11/2017 1000   ALT 15 02/11/2017 1000   BILITOT <0.22 02/11/2017 1000

## 2020-02-20 NOTE — Assessment & Plan Note (Signed)
She is still quite symptomatic from anemia despite improvement of her hemoglobin levels She tolerated IV iron sucrose well without side effects  The most likely cause of her anemia is due to chronic blood loss/malabsorption syndrome. We discussed some of the risks, benefits, and alternatives of intravenous iron infusions. The patient is symptomatic from anemia and the iron level is critically low. She tolerated oral iron supplement poorly and desires to achieved higher levels of iron faster for adequate hematopoesis. Some of the side-effects to be expected including risks of infusion reactions, phlebitis, headaches, nausea and fatigue.  The patient is willing to proceed. Patient education material was dispensed.  Goal is to keep ferritin level greater than 50 and resolution of anemia I plan to increase the dose of IV iron sucrose to 300 mg Given her recent severe menorrhagia, I recommend a few more doses of IV iron after today and then see her back in next month for further follow-up  I also recommend a trial of Lupron injection to see if we can start her menstruation

## 2020-02-27 ENCOUNTER — Telehealth: Payer: Self-pay

## 2020-02-27 ENCOUNTER — Inpatient Hospital Stay: Payer: BC Managed Care – PPO

## 2020-02-27 NOTE — Telephone Encounter (Signed)
Patient did not show for today's appt. Per telephone conversation, patient states she is unable to get to treatment today due to icy driveway. Will send message to scheduling to reschedule this appt. Patient aware and verbalizes understanding.

## 2020-03-05 ENCOUNTER — Inpatient Hospital Stay: Payer: BC Managed Care – PPO

## 2020-03-05 ENCOUNTER — Other Ambulatory Visit: Payer: Self-pay

## 2020-03-05 VITALS — BP 160/78 | HR 75 | Temp 98.4°F | Resp 8

## 2020-03-05 DIAGNOSIS — D500 Iron deficiency anemia secondary to blood loss (chronic): Secondary | ICD-10-CM

## 2020-03-05 MED ORDER — DIPHENHYDRAMINE HCL 25 MG PO CAPS
25.0000 mg | ORAL_CAPSULE | Freq: Once | ORAL | Status: AC
  Administered 2020-03-05: 25 mg via ORAL

## 2020-03-05 MED ORDER — LORATADINE 10 MG PO TABS
ORAL_TABLET | ORAL | Status: AC
  Filled 2020-03-05: qty 1

## 2020-03-05 MED ORDER — LORATADINE 10 MG PO TABS
10.0000 mg | ORAL_TABLET | Freq: Once | ORAL | Status: AC
  Administered 2020-03-05: 10 mg via ORAL

## 2020-03-05 MED ORDER — SODIUM CHLORIDE 0.9 % IV SOLN
300.0000 mg | Freq: Once | INTRAVENOUS | Status: AC
  Administered 2020-03-05: 300 mg via INTRAVENOUS
  Filled 2020-03-05: qty 10

## 2020-03-05 MED ORDER — FAMOTIDINE IN NACL 20-0.9 MG/50ML-% IV SOLN
INTRAVENOUS | Status: AC
  Filled 2020-03-05: qty 50

## 2020-03-05 MED ORDER — FAMOTIDINE IN NACL 20-0.9 MG/50ML-% IV SOLN
20.0000 mg | Freq: Once | INTRAVENOUS | Status: AC
  Administered 2020-03-05: 20 mg via INTRAVENOUS

## 2020-03-05 MED ORDER — DIPHENHYDRAMINE HCL 25 MG PO CAPS
ORAL_CAPSULE | ORAL | Status: AC
  Filled 2020-03-05: qty 1

## 2020-03-05 NOTE — Patient Instructions (Signed)
Iron Dextran injection °What is this medicine? °IRON DEXTRAN (AHY ern DEX tran) is an iron complex. Iron is used to make healthy red blood cells, which carry oxygen and nutrients through the body. This medicine is used to treat people who cannot take iron by mouth and have low levels of iron in the blood. °This medicine may be used for other purposes; ask your health care provider or pharmacist if you have questions. °COMMON BRAND NAME(S): Dexferrum, INFeD °What should I tell my health care provider before I take this medicine? °They need to know if you have any of these conditions: °· anemia not caused by low iron levels °· heart disease °· high levels of iron in the blood °· kidney disease °· liver disease °· an unusual or allergic reaction to iron, other medicines, foods, dyes, or preservatives °· pregnant or trying to get pregnant °· breast-feeding °How should I use this medicine? °This medicine is for injection into a vein or a muscle. It is given by a health care professional in a hospital or clinic setting. °Talk to your pediatrician regarding the use of this medicine in children. While this drug may be prescribed for children as young as 4 months old for selected conditions, precautions do apply. °Overdosage: If you think you have taken too much of this medicine contact a poison control center or emergency room at once. °NOTE: This medicine is only for you. Do not share this medicine with others. °What if I miss a dose? °It is important not to miss your dose. Call your doctor or health care professional if you are unable to keep an appointment. °What may interact with this medicine? °Do not take this medicine with any of the following medications: °· deferoxamine °· dimercaprol °· other iron products °This medicine may also interact with the following medications: °· chloramphenicol °· deferasirox °This list may not describe all possible interactions. Give your health care provider a list of all the  medicines, herbs, non-prescription drugs, or dietary supplements you use. Also tell them if you smoke, drink alcohol, or use illegal drugs. Some items may interact with your medicine. °What should I watch for while using this medicine? °Visit your doctor or health care professional regularly. Tell your doctor if your symptoms do not start to get better or if they get worse. You may need blood work done while you are taking this medicine. °You may need to follow a special diet. Talk to your doctor. Foods that contain iron include: whole grains/cereals, dried fruits, beans, or peas, leafy green vegetables, and organ meats (liver, kidney). °Long-term use of this medicine may increase your risk of some cancers. Talk to your doctor about how to limit your risk. °What side effects may I notice from receiving this medicine? °Side effects that you should report to your doctor or health care professional as soon as possible: °· allergic reactions like skin rash, itching or hives, swelling of the face, lips, or tongue °· blue lips, nails, or skin °· breathing problems °· changes in blood pressure °· chest pain °· confusion °· fast, irregular heartbeat °· feeling faint or lightheaded, falls °· fever or chills °· flushing, sweating, or hot feelings °· joint or muscle aches or pains °· pain, tingling, numbness in the hands or feet °· seizures °· unusually weak or tired °Side effects that usually do not require medical attention (report to your doctor or health care professional if they continue or are bothersome): °· change in taste (metallic taste) °·   diarrhea °· headache °· irritation at site where injected °· nausea, vomiting °· stomach upset °This list may not describe all possible side effects. Call your doctor for medical advice about side effects. You may report side effects to FDA at 1-800-FDA-1088. °Where should I keep my medicine? °This drug is given in a hospital or clinic and will not be stored at home. °NOTE: This  sheet is a summary. It may not cover all possible information. If you have questions about this medicine, talk to your doctor, pharmacist, or health care provider. °© 2021 [employer] (2007-06-13 16:59:50) ° °

## 2020-03-07 ENCOUNTER — Other Ambulatory Visit: Payer: Self-pay

## 2020-03-07 ENCOUNTER — Inpatient Hospital Stay: Payer: BC Managed Care – PPO

## 2020-03-07 VITALS — BP 149/75 | HR 69 | Temp 98.5°F | Resp 18

## 2020-03-07 DIAGNOSIS — D500 Iron deficiency anemia secondary to blood loss (chronic): Secondary | ICD-10-CM

## 2020-03-07 MED ORDER — LORATADINE 10 MG PO TABS
ORAL_TABLET | ORAL | Status: AC
  Filled 2020-03-07: qty 1

## 2020-03-07 MED ORDER — DIPHENHYDRAMINE HCL 25 MG PO CAPS
25.0000 mg | ORAL_CAPSULE | Freq: Once | ORAL | Status: AC
  Administered 2020-03-07: 25 mg via ORAL

## 2020-03-07 MED ORDER — FAMOTIDINE IN NACL 20-0.9 MG/50ML-% IV SOLN
INTRAVENOUS | Status: AC
  Filled 2020-03-07: qty 50

## 2020-03-07 MED ORDER — FAMOTIDINE IN NACL 20-0.9 MG/50ML-% IV SOLN
20.0000 mg | Freq: Once | INTRAVENOUS | Status: AC
  Administered 2020-03-07: 20 mg via INTRAVENOUS

## 2020-03-07 MED ORDER — DIPHENHYDRAMINE HCL 25 MG PO CAPS
ORAL_CAPSULE | ORAL | Status: AC
  Filled 2020-03-07: qty 1

## 2020-03-07 MED ORDER — LORATADINE 10 MG PO TABS
10.0000 mg | ORAL_TABLET | Freq: Once | ORAL | Status: AC
  Administered 2020-03-07: 10 mg via ORAL

## 2020-03-07 MED ORDER — SODIUM CHLORIDE 0.9 % IV SOLN
300.0000 mg | Freq: Once | INTRAVENOUS | Status: AC
  Administered 2020-03-07: 300 mg via INTRAVENOUS
  Filled 2020-03-07: qty 10

## 2020-03-07 MED ORDER — SODIUM CHLORIDE 0.9 % IV SOLN
Freq: Once | INTRAVENOUS | Status: DC
  Filled 2020-03-07: qty 250

## 2020-03-07 MED ORDER — LEUPROLIDE ACETATE 3.75 MG IM KIT
3.7500 mg | PACK | INTRAMUSCULAR | Status: DC
  Filled 2020-03-07: qty 3.75

## 2020-03-07 NOTE — Patient Instructions (Signed)
Iron Dextran injection °What is this medicine? °IRON DEXTRAN (AHY ern DEX tran) is an iron complex. Iron is used to make healthy red blood cells, which carry oxygen and nutrients through the body. This medicine is used to treat people who cannot take iron by mouth and have low levels of iron in the blood. °This medicine may be used for other purposes; ask your health care provider or pharmacist if you have questions. °COMMON BRAND NAME(S): Dexferrum, INFeD °What should I tell my health care provider before I take this medicine? °They need to know if you have any of these conditions: °· anemia not caused by low iron levels °· heart disease °· high levels of iron in the blood °· kidney disease °· liver disease °· an unusual or allergic reaction to iron, other medicines, foods, dyes, or preservatives °· pregnant or trying to get pregnant °· breast-feeding °How should I use this medicine? °This medicine is for injection into a vein or a muscle. It is given by a health care professional in a hospital or clinic setting. °Talk to your pediatrician regarding the use of this medicine in children. While this drug may be prescribed for children as young as 4 months old for selected conditions, precautions do apply. °Overdosage: If you think you have taken too much of this medicine contact a poison control center or emergency room at once. °NOTE: This medicine is only for you. Do not share this medicine with others. °What if I miss a dose? °It is important not to miss your dose. Call your doctor or health care professional if you are unable to keep an appointment. °What may interact with this medicine? °Do not take this medicine with any of the following medications: °· deferoxamine °· dimercaprol °· other iron products °This medicine may also interact with the following medications: °· chloramphenicol °· deferasirox °This list may not describe all possible interactions. Give your health care provider a list of all the  medicines, herbs, non-prescription drugs, or dietary supplements you use. Also tell them if you smoke, drink alcohol, or use illegal drugs. Some items may interact with your medicine. °What should I watch for while using this medicine? °Visit your doctor or health care professional regularly. Tell your doctor if your symptoms do not start to get better or if they get worse. You may need blood work done while you are taking this medicine. °You may need to follow a special diet. Talk to your doctor. Foods that contain iron include: whole grains/cereals, dried fruits, beans, or peas, leafy green vegetables, and organ meats (liver, kidney). °Long-term use of this medicine may increase your risk of some cancers. Talk to your doctor about how to limit your risk. °What side effects may I notice from receiving this medicine? °Side effects that you should report to your doctor or health care professional as soon as possible: °· allergic reactions like skin rash, itching or hives, swelling of the face, lips, or tongue °· blue lips, nails, or skin °· breathing problems °· changes in blood pressure °· chest pain °· confusion °· fast, irregular heartbeat °· feeling faint or lightheaded, falls °· fever or chills °· flushing, sweating, or hot feelings °· joint or muscle aches or pains °· pain, tingling, numbness in the hands or feet °· seizures °· unusually weak or tired °Side effects that usually do not require medical attention (report to your doctor or health care professional if they continue or are bothersome): °· change in taste (metallic taste) °·   diarrhea °· headache °· irritation at site where injected °· nausea, vomiting °· stomach upset °This list may not describe all possible side effects. Call your doctor for medical advice about side effects. You may report side effects to FDA at 1-800-FDA-1088. °Where should I keep my medicine? °This drug is given in a hospital or clinic and will not be stored at home. °NOTE: This  sheet is a summary. It may not cover all possible information. If you have questions about this medicine, talk to your doctor, pharmacist, or health care provider. °© 2021 [employer] (2007-06-13 16:59:50) ° °

## 2020-03-14 ENCOUNTER — Other Ambulatory Visit: Payer: Self-pay

## 2020-03-14 ENCOUNTER — Inpatient Hospital Stay: Payer: BC Managed Care – PPO | Attending: Hematology and Oncology

## 2020-03-14 VITALS — BP 134/58 | HR 80 | Temp 98.2°F | Resp 16

## 2020-03-14 DIAGNOSIS — D500 Iron deficiency anemia secondary to blood loss (chronic): Secondary | ICD-10-CM | POA: Diagnosis not present

## 2020-03-14 DIAGNOSIS — N920 Excessive and frequent menstruation with regular cycle: Secondary | ICD-10-CM | POA: Insufficient documentation

## 2020-03-14 MED ORDER — ONDANSETRON HCL 8 MG PO TABS
8.0000 mg | ORAL_TABLET | Freq: Once | ORAL | Status: AC
  Administered 2020-03-14: 8 mg via ORAL

## 2020-03-14 MED ORDER — DIPHENHYDRAMINE HCL 25 MG PO CAPS
25.0000 mg | ORAL_CAPSULE | Freq: Once | ORAL | Status: AC
  Administered 2020-03-14: 25 mg via ORAL

## 2020-03-14 MED ORDER — LORATADINE 10 MG PO TABS
ORAL_TABLET | ORAL | Status: AC
  Filled 2020-03-14: qty 1

## 2020-03-14 MED ORDER — FAMOTIDINE IN NACL 20-0.9 MG/50ML-% IV SOLN
INTRAVENOUS | Status: AC
  Filled 2020-03-14: qty 50

## 2020-03-14 MED ORDER — LORATADINE 10 MG PO TABS
10.0000 mg | ORAL_TABLET | Freq: Once | ORAL | Status: AC
  Administered 2020-03-14: 10 mg via ORAL

## 2020-03-14 MED ORDER — FAMOTIDINE IN NACL 20-0.9 MG/50ML-% IV SOLN
20.0000 mg | Freq: Once | INTRAVENOUS | Status: AC
Start: 2020-03-14 — End: 2020-03-14
  Administered 2020-03-14: 20 mg via INTRAVENOUS

## 2020-03-14 MED ORDER — DIPHENHYDRAMINE HCL 25 MG PO CAPS
ORAL_CAPSULE | ORAL | Status: AC
  Filled 2020-03-14: qty 1

## 2020-03-14 MED ORDER — SODIUM CHLORIDE 0.9 % IV SOLN
300.0000 mg | Freq: Once | INTRAVENOUS | Status: AC
  Administered 2020-03-14: 300 mg via INTRAVENOUS
  Filled 2020-03-14: qty 10

## 2020-03-14 MED ORDER — SODIUM CHLORIDE 0.9 % IV SOLN
Freq: Once | INTRAVENOUS | Status: AC
  Filled 2020-03-14: qty 250

## 2020-03-14 MED ORDER — ONDANSETRON HCL 8 MG PO TABS
ORAL_TABLET | ORAL | Status: AC
  Filled 2020-03-14: qty 1

## 2020-03-14 NOTE — Patient Instructions (Signed)
Iron Dextran injection °What is this medicine? °IRON DEXTRAN (AHY ern DEX tran) is an iron complex. Iron is used to make healthy red blood cells, which carry oxygen and nutrients through the body. This medicine is used to treat people who cannot take iron by mouth and have low levels of iron in the blood. °This medicine may be used for other purposes; ask your health care provider or pharmacist if you have questions. °COMMON BRAND NAME(S): Dexferrum, INFeD °What should I tell my health care provider before I take this medicine? °They need to know if you have any of these conditions: °· anemia not caused by low iron levels °· heart disease °· high levels of iron in the blood °· kidney disease °· liver disease °· an unusual or allergic reaction to iron, other medicines, foods, dyes, or preservatives °· pregnant or trying to get pregnant °· breast-feeding °How should I use this medicine? °This medicine is for injection into a vein or a muscle. It is given by a health care professional in a hospital or clinic setting. °Talk to your pediatrician regarding the use of this medicine in children. While this drug may be prescribed for children as young as 4 months old for selected conditions, precautions do apply. °Overdosage: If you think you have taken too much of this medicine contact a poison control center or emergency room at once. °NOTE: This medicine is only for you. Do not share this medicine with others. °What if I miss a dose? °It is important not to miss your dose. Call your doctor or health care professional if you are unable to keep an appointment. °What may interact with this medicine? °Do not take this medicine with any of the following medications: °· deferoxamine °· dimercaprol °· other iron products °This medicine may also interact with the following medications: °· chloramphenicol °· deferasirox °This list may not describe all possible interactions. Give your health care provider a list of all the  medicines, herbs, non-prescription drugs, or dietary supplements you use. Also tell them if you smoke, drink alcohol, or use illegal drugs. Some items may interact with your medicine. °What should I watch for while using this medicine? °Visit your doctor or health care professional regularly. Tell your doctor if your symptoms do not start to get better or if they get worse. You may need blood work done while you are taking this medicine. °You may need to follow a special diet. Talk to your doctor. Foods that contain iron include: whole grains/cereals, dried fruits, beans, or peas, leafy green vegetables, and organ meats (liver, kidney). °Long-term use of this medicine may increase your risk of some cancers. Talk to your doctor about how to limit your risk. °What side effects may I notice from receiving this medicine? °Side effects that you should report to your doctor or health care professional as soon as possible: °· allergic reactions like skin rash, itching or hives, swelling of the face, lips, or tongue °· blue lips, nails, or skin °· breathing problems °· changes in blood pressure °· chest pain °· confusion °· fast, irregular heartbeat °· feeling faint or lightheaded, falls °· fever or chills °· flushing, sweating, or hot feelings °· joint or muscle aches or pains °· pain, tingling, numbness in the hands or feet °· seizures °· unusually weak or tired °Side effects that usually do not require medical attention (report to your doctor or health care professional if they continue or are bothersome): °· change in taste (metallic taste) °·   diarrhea °· headache °· irritation at site where injected °· nausea, vomiting °· stomach upset °This list may not describe all possible side effects. Call your doctor for medical advice about side effects. You may report side effects to FDA at 1-800-FDA-1088. °Where should I keep my medicine? °This drug is given in a hospital or clinic and will not be stored at home. °NOTE: This  sheet is a summary. It may not cover all possible information. If you have questions about this medicine, talk to your doctor, pharmacist, or health care provider. °© 2021 [employer] (2007-06-13 16:59:50) ° °

## 2020-03-14 NOTE — Progress Notes (Signed)
Patient declined 30 min post observation.  VS WNL; discharged in stable condition.

## 2020-04-04 ENCOUNTER — Inpatient Hospital Stay: Payer: BC Managed Care – PPO

## 2020-04-04 ENCOUNTER — Encounter: Payer: Self-pay | Admitting: Hematology and Oncology

## 2020-04-04 ENCOUNTER — Other Ambulatory Visit: Payer: Self-pay | Admitting: Hematology and Oncology

## 2020-04-04 ENCOUNTER — Other Ambulatory Visit: Payer: Self-pay

## 2020-04-04 ENCOUNTER — Inpatient Hospital Stay (HOSPITAL_BASED_OUTPATIENT_CLINIC_OR_DEPARTMENT_OTHER): Payer: BC Managed Care – PPO | Admitting: Hematology and Oncology

## 2020-04-04 ENCOUNTER — Telehealth: Payer: Self-pay | Admitting: Hematology and Oncology

## 2020-04-04 VITALS — BP 140/75 | HR 77 | Temp 98.6°F | Resp 20

## 2020-04-04 VITALS — BP 165/70 | HR 80 | Temp 97.7°F | Resp 18 | Ht 64.0 in | Wt 284.8 lb

## 2020-04-04 DIAGNOSIS — R5383 Other fatigue: Secondary | ICD-10-CM

## 2020-04-04 DIAGNOSIS — R030 Elevated blood-pressure reading, without diagnosis of hypertension: Secondary | ICD-10-CM | POA: Diagnosis not present

## 2020-04-04 DIAGNOSIS — D500 Iron deficiency anemia secondary to blood loss (chronic): Secondary | ICD-10-CM

## 2020-04-04 DIAGNOSIS — N920 Excessive and frequent menstruation with regular cycle: Secondary | ICD-10-CM | POA: Diagnosis not present

## 2020-04-04 LAB — CBC WITH DIFFERENTIAL/PLATELET
Abs Immature Granulocytes: 0.02 10*3/uL (ref 0.00–0.07)
Basophils Absolute: 0 10*3/uL (ref 0.0–0.1)
Basophils Relative: 0 %
Eosinophils Absolute: 0.2 10*3/uL (ref 0.0–0.5)
Eosinophils Relative: 3 %
HCT: 38.5 % (ref 36.0–46.0)
Hemoglobin: 11 g/dL — ABNORMAL LOW (ref 12.0–15.0)
Immature Granulocytes: 0 %
Lymphocytes Relative: 36 %
Lymphs Abs: 2.4 10*3/uL (ref 0.7–4.0)
MCH: 22.7 pg — ABNORMAL LOW (ref 26.0–34.0)
MCHC: 28.6 g/dL — ABNORMAL LOW (ref 30.0–36.0)
MCV: 79.4 fL — ABNORMAL LOW (ref 80.0–100.0)
Monocytes Absolute: 0.3 10*3/uL (ref 0.1–1.0)
Monocytes Relative: 5 %
Neutro Abs: 3.7 10*3/uL (ref 1.7–7.7)
Neutrophils Relative %: 56 %
Platelets: 340 10*3/uL (ref 150–400)
RBC: 4.85 MIL/uL (ref 3.87–5.11)
RDW: 18.7 % — ABNORMAL HIGH (ref 11.5–15.5)
WBC: 6.5 10*3/uL (ref 4.0–10.5)
nRBC: 0 % (ref 0.0–0.2)

## 2020-04-04 MED ORDER — LORATADINE 10 MG PO TABS
10.0000 mg | ORAL_TABLET | Freq: Once | ORAL | Status: AC
  Administered 2020-04-04: 10 mg via ORAL

## 2020-04-04 MED ORDER — FAMOTIDINE IN NACL 20-0.9 MG/50ML-% IV SOLN
INTRAVENOUS | Status: AC
  Filled 2020-04-04: qty 50

## 2020-04-04 MED ORDER — SODIUM CHLORIDE 0.9 % IV SOLN
300.0000 mg | Freq: Once | INTRAVENOUS | Status: AC
  Administered 2020-04-04: 300 mg via INTRAVENOUS
  Filled 2020-04-04: qty 15
  Filled 2020-04-04: qty 10

## 2020-04-04 MED ORDER — DIPHENHYDRAMINE HCL 25 MG PO CAPS
ORAL_CAPSULE | ORAL | Status: AC
  Filled 2020-04-04: qty 1

## 2020-04-04 MED ORDER — LORATADINE 10 MG PO TABS
ORAL_TABLET | ORAL | Status: AC
  Filled 2020-04-04: qty 1

## 2020-04-04 MED ORDER — DIPHENHYDRAMINE HCL 25 MG PO CAPS
25.0000 mg | ORAL_CAPSULE | Freq: Once | ORAL | Status: AC
  Administered 2020-04-04: 25 mg via ORAL

## 2020-04-04 MED ORDER — SODIUM CHLORIDE 0.9 % IV SOLN
Freq: Once | INTRAVENOUS | Status: AC
  Filled 2020-04-04: qty 250

## 2020-04-04 MED ORDER — FAMOTIDINE IN NACL 20-0.9 MG/50ML-% IV SOLN
20.0000 mg | Freq: Once | INTRAVENOUS | Status: AC
Start: 2020-04-04 — End: 2020-04-04
  Administered 2020-04-04: 20 mg via INTRAVENOUS

## 2020-04-04 NOTE — Assessment & Plan Note (Signed)
This could be attributed to anxiety Observe closely for now

## 2020-04-04 NOTE — Assessment & Plan Note (Signed)
She is still quite symptomatic from anemia despite improvement of her hemoglobin levels She tolerated IV iron sucrose well without side effects  The most likely cause of her anemia is due to chronic blood loss/malabsorption syndrome. We discussed some of the risks, benefits, and alternatives of intravenous iron infusions. The patient is symptomatic from anemia and the iron level is critically low. She tolerated oral iron supplement poorly and desires to achieved higher levels of iron faster for adequate hematopoesis. Some of the side-effects to be expected including risks of infusion reactions, phlebitis, headaches, nausea and fatigue.  The patient is willing to proceed. Patient education material was dispensed.  Goal is to keep ferritin level greater than 50 and resolution of anemia I plan to increase the dose of IV iron sucrose to 300 mg Given her recent severe menorrhagia, I recommend a few more doses of IV iron after today and then see her back in next month for further follow-up  I also recommend a trial of Lupron injection to see if we can stop her menstruation However, the patient does not like the side effects and declined Lupron injection She is put back on birth control pill by her gynecologist I recommend 3 more doses of IV iron after today's dose and I plan to see her back at the end of April for further follow-up

## 2020-04-04 NOTE — Progress Notes (Signed)
[place] OFFICE PROGRESS NOTE  [name], DO  ASSESSMENT & PLAN:  Iron deficiency anemia She is still quite symptomatic from anemia despite improvement of her hemoglobin levels She tolerated IV iron sucrose well without side effects  The most likely cause of her anemia is due to chronic blood loss/malabsorption syndrome. We discussed some of the risks, benefits, and alternatives of intravenous iron infusions. The patient is symptomatic from anemia and the iron level is critically low. She tolerated oral iron supplement poorly and desires to achieved higher levels of iron faster for adequate hematopoesis. Some of the side-effects to be expected including risks of infusion reactions, phlebitis, headaches, nausea and fatigue.  The patient is willing to proceed. Patient education material was dispensed.  Goal is to keep ferritin level greater than 50 and resolution of anemia I plan to increase the dose of IV iron sucrose to 300 mg Given her recent severe menorrhagia, I recommend a few more doses of IV iron after today and then see her back in next month for further follow-up  I also recommend a trial of Lupron injection to see if we can stop her menstruation However, the patient does not like the side effects and declined Lupron injection She is put back on birth control pill by her gynecologist I recommend 3 more doses of IV iron after today's dose and I plan to see her back at the end of April for further follow-up  Menorrhagia with regular cycle As discussed previously, I recommended Lupron injection but the patient declined She is back on birth control pill Her gynecologist recommended hysterectomy but the patient is undecided  Elevated BP without diagnosis of hypertension This could be attributed to anxiety Observe closely for now   Orders Placed This Encounter  Procedures  . Iron and TIBC    Standing Status:   Standing    Number of Occurrences:   11     Standing Expiration Date:   04/04/2021  . Ferritin    Standing Status:   Standing    Number of Occurrences:   9    Standing Expiration Date:   04/04/2021    The total time spent in the appointment was 20 minutes encounter with patients including review of chart and various tests results, discussions about plan of care and coordination of care plan   All questions were answered. The patient knows to call the clinic with any problems, questions or concerns. No barriers to learning was detected.    [name], MD 2/25/202210:23 AM  INTERVAL HISTORY: [name] [name] 45 y.o. female returns for further follow-up on recurrent iron deficiency anemia She continues to complain of excessive fatigue Denies other forms of bleeding She has seen her gynecologist recently who recommended hysterectomy She is undecided She has read side effects of Lupron and decided against it  SUMMARY OF HEMATOLOGIC HISTORY:  She was found to have abnormal CBC from recent blood work for evaluation of fatigue. She denies recent chest pain on exertion, pre-syncopal episodes, or palpitations. She does complain of leg cramps, dizziness, shortness of breath on minimal exertion, and frequent headaches. Recent CBC done last month in May 2015 shows significant anemia with hemoglobin 7.9, MCV of 55 and platelet count of 409. She received one dose of intravenous iron on 07/23/2013. After infusion, she complained of scratchy throat and developed hives. Subsequently, she received premedication with Solu-Medrol and she was able to complete her treatment in July 2015 She received further IV iron in  June 2017 for recurrent iron deficiency anemia In February 2019, EGD and colonoscopy excluded GI blood loss On 06/29/2017, she underwent D&C and polypectomy She has received several doses of intravenous iron in 2019 In 2021, she received blood transfusion and intravenous iron due to severe menorrhagia   I have reviewed the past  medical history, past surgical history, social history and family history with the patient and they are unchanged from previous note.  ALLERGIES:  is allergic to feraheme [ferumoxytol].  MEDICATIONS:  Current Outpatient Medications  Medication Sig Dispense Refill  . fluticasone (FLONASE) 50 MCG/ACT nasal spray Place 2 sprays into both nostrils daily. 1 g 0  . methocarbamol (ROBAXIN) 500 MG tablet Take 1 tablet (500 mg total) by mouth 2 (two) times daily as needed for muscle spasms. 20 tablet 0  . prochlorperazine (COMPAZINE) 10 MG tablet Take 1 tablet (10 mg total) by mouth every 6 (six) hours as needed for nausea or vomiting. 30 tablet 0   No current facility-administered medications for this visit.     REVIEW OF SYSTEMS:   Constitutional: Denies fevers, chills or night sweats Eyes: Denies blurriness of vision Ears, nose, mouth, throat, and face: Denies mucositis or sore throat Respiratory: Denies cough, dyspnea or wheezes Cardiovascular: Denies palpitation, chest discomfort or lower extremity swelling Gastrointestinal:  Denies nausea, heartburn or change in bowel habits Skin: Denies abnormal skin rashes Lymphatics: Denies new lymphadenopathy or easy bruising Neurological:Denies numbness, tingling or new weaknesses Behavioral/Psych: Mood is stable, no new changes  All other systems were reviewed with the patient and are negative.  PHYSICAL EXAMINATION: ECOG PERFORMANCE STATUS: 1 - Symptomatic but completely ambulatory  Vitals:   04/04/20 0948  BP: (!) 165/70  Pulse: 80  Resp: 18  Temp: 97.7 F (36.5 C)  SpO2: 100%   Filed Weights   04/04/20 0948  Weight: 284 lb 12.8 oz (129.2 kg)    GENERAL:alert, no distress and comfortable NEURO: alert & oriented x 3 with fluent speech, no focal motor/sensory deficits  LABORATORY DATA:  I have reviewed the data as listed     Component Value Date/Time   NA 139 02/11/2017 1000   K 3.4 (L) 02/11/2017 1000   CL 103 02/28/2012 1607    CO2 26 02/11/2017 1000   GLUCOSE 108 02/11/2017 1000   BUN 12.0 02/11/2017 1000   CREATININE 0.7 02/11/2017 1000   CALCIUM 9.1 02/11/2017 1000   PROT 7.3 02/11/2017 1000   ALBUMIN 3.5 02/11/2017 1000   AST 15 02/11/2017 1000   ALT 15 02/11/2017 1000   ALKPHOS 48 02/11/2017 1000   BILITOT <0.22 02/11/2017 1000    No results found for: SPEP, UPEP  Lab Results  Component Value Date   WBC 6.5 04/04/2020   NEUTROABS 3.7 04/04/2020   HGB 11.0 (L) 04/04/2020   HCT 38.5 04/04/2020   MCV 79.4 (L) 04/04/2020   PLT 340 04/04/2020      Chemistry      Component Value Date/Time   NA 139 02/11/2017 1000   K 3.4 (L) 02/11/2017 1000   CL 103 02/28/2012 1607   CO2 26 02/11/2017 1000   BUN 12.0 02/11/2017 1000   CREATININE 0.7 02/11/2017 1000      Component Value Date/Time   CALCIUM 9.1 02/11/2017 1000   ALKPHOS 48 02/11/2017 1000   AST 15 02/11/2017 1000   ALT 15 02/11/2017 1000   BILITOT <0.22 02/11/2017 1000

## 2020-04-04 NOTE — Telephone Encounter (Signed)
Scheduled appts per 2/25 sch msg. Pt stated she would refer to mychart for AVS and appts.

## 2020-04-04 NOTE — Patient Instructions (Signed)

## 2020-04-04 NOTE — Assessment & Plan Note (Signed)
As discussed previously, I recommended Lupron injection but the patient declined She is back on birth control pill Her gynecologist recommended hysterectomy but the patient is undecided

## 2020-04-04 NOTE — Progress Notes (Signed)
Pt. declined to stay 30 minute post observation. Vital signs stable, pt. states she has been tolerating treatment well. Left via ambulation, no respiratory distress noted.

## 2020-04-09 ENCOUNTER — Other Ambulatory Visit: Payer: Self-pay | Admitting: Obstetrics and Gynecology

## 2020-04-11 ENCOUNTER — Other Ambulatory Visit: Payer: Self-pay

## 2020-04-11 ENCOUNTER — Inpatient Hospital Stay: Payer: BC Managed Care – PPO | Attending: Hematology and Oncology

## 2020-04-11 VITALS — BP 161/82 | HR 76 | Temp 98.3°F | Resp 18

## 2020-04-11 DIAGNOSIS — N920 Excessive and frequent menstruation with regular cycle: Secondary | ICD-10-CM | POA: Insufficient documentation

## 2020-04-11 DIAGNOSIS — D500 Iron deficiency anemia secondary to blood loss (chronic): Secondary | ICD-10-CM | POA: Diagnosis present

## 2020-04-11 MED ORDER — LORATADINE 10 MG PO TABS
ORAL_TABLET | ORAL | Status: AC
  Filled 2020-04-11: qty 1

## 2020-04-11 MED ORDER — SODIUM CHLORIDE 0.9 % IV SOLN
300.0000 mg | Freq: Once | INTRAVENOUS | Status: AC
  Administered 2020-04-11: 300 mg via INTRAVENOUS
  Filled 2020-04-11: qty 15

## 2020-04-11 MED ORDER — DIPHENHYDRAMINE HCL 25 MG PO CAPS
25.0000 mg | ORAL_CAPSULE | Freq: Once | ORAL | Status: AC
Start: 2020-04-11 — End: 2020-04-11
  Administered 2020-04-11: 25 mg via ORAL

## 2020-04-11 MED ORDER — SODIUM CHLORIDE 0.9 % IV SOLN
INTRAVENOUS | Status: DC
  Filled 2020-04-11: qty 250

## 2020-04-11 MED ORDER — FAMOTIDINE IN NACL 20-0.9 MG/50ML-% IV SOLN
20.0000 mg | Freq: Once | INTRAVENOUS | Status: AC
Start: 2020-04-11 — End: 2020-04-11
  Administered 2020-04-11: 20 mg via INTRAVENOUS

## 2020-04-11 MED ORDER — LORATADINE 10 MG PO TABS
10.0000 mg | ORAL_TABLET | Freq: Once | ORAL | Status: AC
  Administered 2020-04-11: 10 mg via ORAL

## 2020-04-11 MED ORDER — DIPHENHYDRAMINE HCL 25 MG PO CAPS
ORAL_CAPSULE | ORAL | Status: AC
  Filled 2020-04-11: qty 1

## 2020-04-11 MED ORDER — FAMOTIDINE IN NACL 20-0.9 MG/50ML-% IV SOLN
INTRAVENOUS | Status: AC
  Filled 2020-04-11: qty 50

## 2020-04-11 NOTE — Patient Instructions (Signed)

## 2020-04-11 NOTE — Progress Notes (Signed)
Patient declined to wait 30 minute post observation period. Vital signs baseline for patient. IV catheter discontinued and patient sent home.

## 2020-04-15 ENCOUNTER — Telehealth: Payer: Self-pay | Admitting: Hematology and Oncology

## 2020-04-15 NOTE — Telephone Encounter (Signed)
Rescheduled upcoming appointment per 3/8 schedule message. Patient is aware of changes.

## 2020-04-16 ENCOUNTER — Inpatient Hospital Stay: Payer: BC Managed Care – PPO

## 2020-04-16 ENCOUNTER — Other Ambulatory Visit: Payer: Self-pay

## 2020-04-16 VITALS — BP 142/66 | HR 77 | Temp 98.7°F | Resp 18

## 2020-04-16 DIAGNOSIS — D500 Iron deficiency anemia secondary to blood loss (chronic): Secondary | ICD-10-CM | POA: Diagnosis not present

## 2020-04-16 MED ORDER — LORATADINE 10 MG PO TABS
10.0000 mg | ORAL_TABLET | Freq: Once | ORAL | Status: AC
  Administered 2020-04-16: 10 mg via ORAL

## 2020-04-16 MED ORDER — DIPHENHYDRAMINE HCL 25 MG PO CAPS
ORAL_CAPSULE | ORAL | Status: AC
  Filled 2020-04-16: qty 1

## 2020-04-16 MED ORDER — SODIUM CHLORIDE 0.9 % IV SOLN
Freq: Once | INTRAVENOUS | Status: AC
  Filled 2020-04-16: qty 250

## 2020-04-16 MED ORDER — LORATADINE 10 MG PO TABS
ORAL_TABLET | ORAL | Status: AC
  Filled 2020-04-16: qty 1

## 2020-04-16 MED ORDER — DIPHENHYDRAMINE HCL 25 MG PO CAPS
25.0000 mg | ORAL_CAPSULE | Freq: Once | ORAL | Status: AC
  Administered 2020-04-16: 25 mg via ORAL

## 2020-04-16 MED ORDER — SODIUM CHLORIDE 0.9 % IV SOLN
300.0000 mg | Freq: Once | INTRAVENOUS | Status: AC
  Administered 2020-04-16: 300 mg via INTRAVENOUS
  Filled 2020-04-16: qty 5

## 2020-04-16 MED ORDER — FAMOTIDINE IN NACL 20-0.9 MG/50ML-% IV SOLN
20.0000 mg | Freq: Once | INTRAVENOUS | Status: AC
Start: 2020-04-16 — End: 2020-04-16
  Administered 2020-04-16: 20 mg via INTRAVENOUS

## 2020-04-16 MED ORDER — FAMOTIDINE IN NACL 20-0.9 MG/50ML-% IV SOLN
INTRAVENOUS | Status: AC
  Filled 2020-04-16: qty 50

## 2020-04-16 NOTE — Patient Instructions (Signed)

## 2020-04-18 ENCOUNTER — Inpatient Hospital Stay: Payer: BC Managed Care – PPO

## 2020-04-25 ENCOUNTER — Inpatient Hospital Stay: Payer: BC Managed Care – PPO

## 2020-04-25 ENCOUNTER — Other Ambulatory Visit: Payer: Self-pay

## 2020-04-25 VITALS — BP 157/69 | HR 80 | Temp 98.4°F | Resp 18

## 2020-04-25 DIAGNOSIS — D500 Iron deficiency anemia secondary to blood loss (chronic): Secondary | ICD-10-CM

## 2020-04-25 MED ORDER — DIPHENHYDRAMINE HCL 25 MG PO CAPS
25.0000 mg | ORAL_CAPSULE | Freq: Once | ORAL | Status: AC
  Administered 2020-04-25: 25 mg via ORAL

## 2020-04-25 MED ORDER — SODIUM CHLORIDE 0.9 % IV SOLN
Freq: Once | INTRAVENOUS | Status: AC
  Filled 2020-04-25: qty 250

## 2020-04-25 MED ORDER — FAMOTIDINE IN NACL 20-0.9 MG/50ML-% IV SOLN
20.0000 mg | Freq: Once | INTRAVENOUS | Status: AC
  Administered 2020-04-25: 20 mg via INTRAVENOUS

## 2020-04-25 MED ORDER — LORATADINE 10 MG PO TABS
ORAL_TABLET | ORAL | Status: AC
  Filled 2020-04-25: qty 1

## 2020-04-25 MED ORDER — DIPHENHYDRAMINE HCL 25 MG PO CAPS
ORAL_CAPSULE | ORAL | Status: AC
  Filled 2020-04-25: qty 1

## 2020-04-25 MED ORDER — LORATADINE 10 MG PO TABS
10.0000 mg | ORAL_TABLET | Freq: Once | ORAL | Status: AC
  Administered 2020-04-25: 10 mg via ORAL

## 2020-04-25 MED ORDER — FAMOTIDINE IN NACL 20-0.9 MG/50ML-% IV SOLN
INTRAVENOUS | Status: AC
  Filled 2020-04-25: qty 50

## 2020-04-25 MED ORDER — SODIUM CHLORIDE 0.9 % IV SOLN
300.0000 mg | Freq: Once | INTRAVENOUS | Status: AC
  Administered 2020-04-25: 300 mg via INTRAVENOUS
  Filled 2020-04-25: qty 10

## 2020-04-25 NOTE — Patient Instructions (Signed)

## 2020-05-29 ENCOUNTER — Ambulatory Visit
Admission: EM | Admit: 2020-05-29 | Discharge: 2020-05-29 | Disposition: A | Discharge status: Home / Self Care | Payer: BC Managed Care – PPO | Attending: Emergency Medicine | Admitting: Emergency Medicine

## 2020-05-29 ENCOUNTER — Other Ambulatory Visit: Payer: Self-pay

## 2020-05-29 DIAGNOSIS — R202 Paresthesia of skin: Secondary | ICD-10-CM | POA: Diagnosis not present

## 2020-05-29 DIAGNOSIS — H6001 Abscess of right external ear: Secondary | ICD-10-CM | POA: Diagnosis not present

## 2020-05-29 DIAGNOSIS — K219 Gastro-esophageal reflux disease without esophagitis: Secondary | ICD-10-CM | POA: Diagnosis not present

## 2020-05-29 DIAGNOSIS — R590 Localized enlarged lymph nodes: Secondary | ICD-10-CM

## 2020-05-29 MED ORDER — DOXYCYCLINE HYCLATE 100 MG PO CAPS: 100.0000 mg | ORAL_CAPSULE | Freq: Two times a day (BID) | ORAL | 0 refills | Status: AC

## 2020-05-29 MED ORDER — PREDNISONE 10 MG PO TABS: 30.0000 mg | ORAL_TABLET | Freq: Every day | ORAL | 0 refills | Status: AC

## 2020-05-29 MED ORDER — TIZANIDINE HCL 2 MG PO TABS: 2.0000 mg | ORAL_TABLET | Freq: Four times a day (QID) | ORAL | 0 refills | Status: DC | PRN

## 2020-05-29 NOTE — ED Triage Notes (Addendum)
Pt reports "funny feeling" which comes in "jolts" from right arm up to neck and side of face. Pt reports having difficulty describing feeling, states it is not really numbness or pain. Pt also notes some right shoulder pain, mainly on underside of shoulder. Pt also reports having had a migraine this past Monday.  Pt noticed swollen lump under right ear which is palpable by RN. Pt also reports feeling shob, and some indigestion. Symptoms have been going on about a week.

## 2020-05-29 NOTE — ED Provider Notes (Signed)
[facility]    CSN: [id number] Arrival date & time: 05/29/20  1856      History   Chief Complaint Chief Complaint  Patient presents with  . Arm Tingling  . Shortness of Breath    HPI [name] [name] is a 45 y.o. female presenting today for evaluation of right-sided neck discomfort, shoulder tingling and swollen area around right ear.  She reports over the past couple days she has developed discomfort in her right neck has developed an area of swelling behind her ear and below her ear with associated pain and discomfort.  Is having a numbness tingling sensation to neck shoulder and upper arm.  Denies arm weakness.  Denies injury or trauma.  Denies any sore throat cough or congestion.  Denies fevers.  Has also had some central chest discomfort which she describes as a dull ache and has felt like indigestion.  HPI  Past Medical History:  Diagnosis Date  . Anemia   . Headache(784.0)   . PCOS (polycystic ovarian syndrome)     Patient Active Problem List   Diagnosis Date Noted  . Elevated BP without diagnosis of hypertension 04/04/2020  . Weight gain due to medication 12/19/2019  . Thrombocytosis 09/18/2019  . Menorrhagia with regular cycle 02/11/2017  . Microcytic anemia 09/10/2015  . Iron deficiency anemia 07/19/2013  . Thrombocytopenia, unspecified ([place]) 07/19/2013  . Headache 07/19/2013  . Fatigue 02/28/2012  . Unspecified osteomyelitis, ankle and foot 10/26/2011  . Other complications due to other internal orthopedic device, implant, and graft 10/25/2011    Past Surgical History:  Procedure Laterality Date  . BUNIONECTOMY    . BUNIONECTOMY  05/22/11   right  . DILATATION & CURETTAGE/HYSTEROSCOPY WITH MYOSURE N/A 06/29/2017   Procedure: DILATATION & CURETTAGE/HYSTEROSCOPY WITH MYOSURE POLYPECTOMY;  Surgeon: Christophe Louis, MD;  Location: [facility];  Service: Gynecology;  Laterality: N/A;  . INTRAUTERINE DEVICE (IUD) INSERTION N/A 06/29/2017   Procedure:  INTRAUTERINE DEVICE (IUD) INSERTION;  Surgeon: Christophe Louis, MD;  Location: [facility];  Service: Gynecology;  Laterality: N/A;  Mirena Placement  . LAPAROSCOPIC OVARIAN CYSTECTOMY    . LYMPH GLAND EXCISION      OB History    Gravida  3   Para  2   Term  1   Preterm  1   AB  1   Living  2     SAB  1   IAB      Ectopic      Multiple      Live Births  1            Home Medications    Prior to Admission medications   Medication Sig Start Date End Date Taking? Authorizing Provider  doxycycline (VIBRAMYCIN) 100 MG capsule Take 1 capsule (100 mg total) by mouth 2 (two) times daily for 7 days. 05/29/20 06/05/20 Yes Avo Schlachter C, PA-C  predniSONE (DELTASONE) 10 MG tablet Take 3 tablets (30 mg total) by mouth daily with breakfast for 5 days. 05/29/20 06/03/20 Yes Makyia Erxleben C, PA-C  prochlorperazine (COMPAZINE) 10 MG tablet Take 1 tablet (10 mg total) by mouth every 6 (six) hours as needed for nausea or vomiting. 10/02/19  Yes Gorsuch, Ni, MD  tiZANidine (ZANAFLEX) 2 MG tablet Take 1-2 tablets (2-4 mg total) by mouth every 6 (six) hours as needed for muscle spasms. 05/29/20  Yes Albert Devaul C, PA-C  fluticasone (FLONASE) 50 MCG/ACT nasal spray Place 2 sprays into both nostrils daily. 08/23/19  Tasia Catchings, Amy V, PA-C  propranolol (INDERAL) 20 MG tablet TAKE 1 TABLET BY MOUTH THREE TIMES A DAY 05/04/18 05/19/19  Scot Jun, FNP  rizatriptan (MAXALT-MLT) 10 MG disintegrating tablet Take 10 mg by mouth as needed for migraine. May repeat in 2 hours if needed  05/19/19  [provider]    Family History Family History  Problem Relation Age of Onset  . Hypertension Mother     Social History Social History   Tobacco Use  . Smoking status: Never Smoker  . Smokeless tobacco: Never Used  Substance Use Topics  . Alcohol use: Yes    Comment: OCASSIONALLY  . Drug use: No     Allergies   Feraheme [ferumoxytol]   Review of Systems Review of Systems   Constitutional: Negative for activity change, appetite change, chills, fatigue and fever.  HENT: Positive for ear pain. Negative for congestion, rhinorrhea, sinus pressure, sore throat and trouble swallowing.   Eyes: Negative for discharge and redness.  Respiratory: Negative for cough, chest tightness and shortness of breath.   Cardiovascular: Negative for chest pain.  Gastrointestinal: Negative for abdominal pain, diarrhea, nausea and vomiting.  Musculoskeletal: Negative for myalgias.  Skin: Negative for rash.  Neurological: Positive for headaches. Negative for dizziness and light-headedness.     Physical Exam Triage Vital Signs ED Triage Vitals  Enc Vitals Group     BP 05/29/20 1928 (!) 137/97     Pulse Rate 05/29/20 1928 76     Resp 05/29/20 1928 18     Temp 05/29/20 1928 97.6 F (36.4 C)     Temp src --      SpO2 05/29/20 1928 98 %     Weight --      Height --      Head Circumference --      Peak Flow --      Pain Score 05/29/20 1924 2     Pain Loc --      Pain Edu? --      Excl. in [place]? --    No data found.  Updated Vital Signs BP (!) 137/97   Pulse 76   Temp 97.6 F (36.4 C)   Resp 18   LMP 05/23/2020   SpO2 98%   Visual Acuity Right Eye Distance:   Left Eye Distance:   Bilateral Distance:    Right Eye Near:   Left Eye Near:    Bilateral Near:     Physical Exam Vitals and nursing note reviewed.  Constitutional:      Appearance: She is well-developed.     Comments: No acute distress  HENT:     Head: Normocephalic and atraumatic.     Ears:     Comments: Bilateral ears without tenderness to palpation of external auricle, tragus and mastoid, EAC's without erythema or swelling, TM's with good bony landmarks and cone of light. Non erythematous.  Right auricle with area of erythema mild induration and fluctuance noted posteriorly, mild tenderness to palpation    Nose: Nose normal.     Mouth/Throat:     Comments: Oral mucosa pink and moist, no tonsillar  enlargement or exudate. Posterior pharynx patent and nonerythematous, no uvula deviation or swelling. Normal phonation.  Eyes:     Conjunctiva/sclera: Conjunctivae normal.  Neck:     Comments: Right palpable lymphadenopathy to right cervical chain Cardiovascular:     Rate and Rhythm: Normal rate and regular rhythm.  Pulmonary:     Effort: Pulmonary effort is normal.  No respiratory distress.     Comments: Breathing comfortably at rest, CTABL, no wheezing, rales or other adventitious sounds auscultated  Abdominal:     General: There is no distension.  Musculoskeletal:        General: Normal range of motion.     Cervical back: Neck supple.  Skin:    General: Skin is warm and dry.  Neurological:     Mental Status: She is alert and oriented to person, place, and time.      UC Treatments / Results  Labs (all labs ordered are listed, but only abnormal results are displayed) Labs Reviewed - No data to display  EKG   Radiology No results found.  Procedures Procedures (including critical care time)  Medications Ordered in UC Medications - No data to display  Initial Impression / Assessment and Plan / UC Course  I have reviewed the triage vital signs and the nursing notes.  Pertinent labs & imaging results that were available during my care of the patient were reviewed by me and considered in my medical decision making (see chart for details).     EKG normal sinus rhythm, no acute signs of ischemia or infarction Placing on doxycycline to cover for early abscess noted to posterior right ear, lymphadenopathy may be secondary to this, anti-inflammatories warm compresses, given patient's paresthesias into neck/arm opted to place on prednisone to help with inflammation around any nerves, supplement tizanidine for further relief of neck discomfort.  Continue to monitor, Pepcid for underlying indigestion.  Follow-up if any symptoms not improving worsening or changing.  Discussed  strict return precautions. Patient verbalized understanding and is agreeable with plan.  Final Clinical Impressions(s) / UC Diagnoses   Final diagnoses:  Abscess of right external ear  Lymphadenopathy of right cervical region  Paresthesias  Gastroesophageal reflux disease without esophagitis     Discharge Instructions     EKG normal Begin doxycycline twice daily for 1 week to treat abscess behind the ear Warm compresses Prednisone 30 mg daily for 5 days to help with numbness tingling May support with muscle relaxer at home for neck discomfort-will cause drowsiness, do not drive or work after taking For indigestion May use over-the-counter Pepcid or Maalox  Follow-up if not improving or worsening    ED Prescriptions    Medication Sig Dispense Auth. Provider   doxycycline (VIBRAMYCIN) 100 MG capsule Take 1 capsule (100 mg total) by mouth 2 (two) times daily for 7 days. 20 capsule Kollin Udell C, PA-C   predniSONE (DELTASONE) 10 MG tablet Take 3 tablets (30 mg total) by mouth daily with breakfast for 5 days. 15 tablet Krithika Tome C, PA-C   tiZANidine (ZANAFLEX) 2 MG tablet Take 1-2 tablets (2-4 mg total) by mouth every 6 (six) hours as needed for muscle spasms. 30 tablet Lyrick Lagrand, [place] C, PA-C     PDMP not reviewed this encounter.   Janith Lima, PA-C 05/30/20 1204

## 2020-05-29 NOTE — Discharge Instructions (Signed)
EKG normal Begin doxycycline twice daily for 1 week to treat abscess behind the ear Warm compresses Prednisone 30 mg daily for 5 days to help with numbness tingling May support with muscle relaxer at home for neck discomfort-will cause drowsiness, do not drive or work after taking For indigestion May use over-the-counter Pepcid or Maalox  Follow-up if not improving or worsening

## 2020-06-06 ENCOUNTER — Inpatient Hospital Stay: Payer: BC Managed Care – PPO

## 2020-06-06 ENCOUNTER — Other Ambulatory Visit: Payer: Self-pay

## 2020-06-06 ENCOUNTER — Inpatient Hospital Stay: Payer: BC Managed Care – PPO | Attending: Hematology and Oncology

## 2020-06-06 ENCOUNTER — Inpatient Hospital Stay (HOSPITAL_BASED_OUTPATIENT_CLINIC_OR_DEPARTMENT_OTHER): Payer: BC Managed Care – PPO | Admitting: Hematology and Oncology

## 2020-06-06 ENCOUNTER — Encounter: Payer: Self-pay | Admitting: Hematology and Oncology

## 2020-06-06 VITALS — BP 159/72 | HR 77 | Temp 99.2°F | Resp 16

## 2020-06-06 DIAGNOSIS — D500 Iron deficiency anemia secondary to blood loss (chronic): Secondary | ICD-10-CM | POA: Insufficient documentation

## 2020-06-06 DIAGNOSIS — N920 Excessive and frequent menstruation with regular cycle: Secondary | ICD-10-CM | POA: Insufficient documentation

## 2020-06-06 DIAGNOSIS — H9209 Otalgia, unspecified ear: Secondary | ICD-10-CM | POA: Insufficient documentation

## 2020-06-06 DIAGNOSIS — R5383 Other fatigue: Secondary | ICD-10-CM

## 2020-06-06 LAB — IRON AND TIBC
Iron: 69 ug/dL (ref 41–142)
Saturation Ratios: 25 % (ref 21–57)
TIBC: 278 ug/dL (ref 236–444)
UIBC: 208 ug/dL (ref 120–384)

## 2020-06-06 LAB — FERRITIN: Ferritin: 212 ng/mL (ref 11–307)

## 2020-06-06 MED ORDER — DIPHENHYDRAMINE HCL 25 MG PO CAPS
ORAL_CAPSULE | ORAL | Status: AC
  Filled 2020-06-06: qty 1

## 2020-06-06 MED ORDER — DIPHENHYDRAMINE HCL 25 MG PO CAPS
25.0000 mg | ORAL_CAPSULE | Freq: Once | ORAL | Status: AC
Start: 2020-06-06 — End: 2020-06-06
  Administered 2020-06-06: 25 mg via ORAL

## 2020-06-06 MED ORDER — LORATADINE 10 MG PO TABS
ORAL_TABLET | ORAL | Status: AC
  Filled 2020-06-06: qty 1

## 2020-06-06 MED ORDER — IRON SUCROSE 20 MG/ML IV SOLN
300.0000 mg | Freq: Once | INTRAVENOUS | Status: AC
Start: 2020-06-06 — End: 2020-06-06
  Administered 2020-06-06: 300 mg via INTRAVENOUS
  Filled 2020-06-06: qty 300

## 2020-06-06 MED ORDER — FAMOTIDINE IN NACL 20-0.9 MG/50ML-% IV SOLN
INTRAVENOUS | Status: AC
  Filled 2020-06-06: qty 50

## 2020-06-06 MED ORDER — LORATADINE 10 MG PO TABS
10.0000 mg | ORAL_TABLET | Freq: Once | ORAL | Status: AC
Start: 2020-06-06 — End: 2020-06-06
  Administered 2020-06-06: 10 mg via ORAL

## 2020-06-06 MED ORDER — PROCHLORPERAZINE MALEATE 10 MG PO TABS: 10.0000 mg | ORAL_TABLET | Freq: Four times a day (QID) | ORAL | 1 refills | Status: DC | PRN

## 2020-06-06 MED ORDER — FAMOTIDINE IN NACL 20-0.9 MG/50ML-% IV SOLN
20.0000 mg | Freq: Once | INTRAVENOUS | Status: AC
  Administered 2020-06-06: 20 mg via INTRAVENOUS

## 2020-06-06 MED ORDER — SODIUM CHLORIDE 0.9 % IV SOLN
Freq: Once | INTRAVENOUS | Status: AC
  Filled 2020-06-06: qty 250

## 2020-06-06 MED ORDER — AMOXICILLIN 500 MG PO TABS: 500.0000 mg | ORAL_TABLET | Freq: Two times a day (BID) | ORAL | 0 refills | Status: DC

## 2020-06-06 NOTE — Assessment & Plan Note (Signed)
Unfortunately, due to poor venous access, we are not available to get an accurate CBC Based on her ongoing recent bleeding and her symptoms, we will proceed with IV iron today I plan to see her again in 3 months for further follow-up

## 2020-06-06 NOTE — Progress Notes (Signed)
[name] OFFICE PROGRESS NOTE  [name] Morning, DO  ASSESSMENT & PLAN:  Iron deficiency anemia Unfortunately, due to poor venous access, we are not available to get an accurate CBC Based on her ongoing recent bleeding and her symptoms, we will proceed with IV iron today I plan to see her again in 3 months for further follow-up  Menorrhagia with regular cycle She is currently on birth control pill Her recent endometrial biopsy showed no evidence of malignancy I would defer to her gynecologist for further management  Ear ache She has persistent symptoms She did not tolerate doxycycline well I will prescribe a course of amoxicillin for her   No orders of the defined types were placed in this encounter.   The total time spent in the appointment was 20 minutes encounter with patients including review of chart and various tests results, discussions about plan of care and coordination of care plan   All questions were answered. The patient knows to call the clinic with any problems, questions or concerns. No barriers to learning was detected.    [name], MD 4/29/20221:41 PM  INTERVAL HISTORY: [name] [name] 45 y.o. female returns for further follow-up She continues to have heavy menstruation She is not able to take time off work to undergo hysterectomy She was placed on birth control pill by her gynecologist but still have heavy menstruation She has craving for ice recently She was seen at urgent care recently for earache and was diagnosed with infection She was prescribed doxycycline but developed nausea with the antibiotic and requested an alternative antibiotic She denies fever chills   SUMMARY OF HEMATOLOGIC HISTORY:  She was found to have abnormal CBC from recent blood work for evaluation of fatigue. She denies recent chest pain on exertion, pre-syncopal episodes, or palpitations. She does complain of leg cramps, dizziness, shortness of breath on  minimal exertion, and frequent headaches. Recent CBC done last month in May 2015 shows significant anemia with hemoglobin 7.9, MCV of 55 and platelet count of 409. She received one dose of intravenous iron on 07/23/2013. After infusion, she complained of scratchy throat and developed hives. Subsequently, she received premedication with Solu-Medrol and she was able to complete her treatment in July 2015 She received further IV iron in June 2017 for recurrent iron deficiency anemia In February 2019, EGD and colonoscopy excluded GI blood loss On 06/29/2017, she underwent D&C and polypectomy She has received several doses of intravenous iron in 2019 In 2021, she received blood transfusion and intravenous iron due to severe menorrhagia  I have reviewed the past medical history, past surgical history, social history and family history with the patient and they are unchanged from previous note.  ALLERGIES:  is allergic to feraheme [ferumoxytol].  MEDICATIONS:  Current Outpatient Medications  Medication Sig Dispense Refill  . amoxicillin (AMOXIL) 500 MG tablet Take 1 tablet (500 mg total) by mouth 2 (two) times daily. 14 tablet 0  . fluticasone (FLONASE) 50 MCG/ACT nasal spray Place 2 sprays into both nostrils daily. 1 g 0  . prochlorperazine (COMPAZINE) 10 MG tablet Take 1 tablet (10 mg total) by mouth every 6 (six) hours as needed for nausea or vomiting. 30 tablet 1  . tiZANidine (ZANAFLEX) 2 MG tablet Take 1-2 tablets (2-4 mg total) by mouth every 6 (six) hours as needed for muscle spasms. 30 tablet 0   No current facility-administered medications for this visit.   Facility-Administered Medications Ordered in Other Visits  Medication Dose Route  Frequency Provider Last Rate Last Admin  . iron sucrose (VENOFER) 300 mg in sodium chloride 0.9 % 250 mL IVPB  300 mg Intravenous Once Alvy Bimler, Darwin Guastella, MD 176.7 mL/hr at 06/06/20 1248 300 mg at 06/06/20 1248     REVIEW OF SYSTEMS:   Constitutional: Denies  fevers, chills or night sweats Eyes: Denies blurriness of vision Ears, nose, mouth, throat, and face: Denies mucositis or sore throat Respiratory: Denies cough, dyspnea or wheezes Cardiovascular: Denies palpitation, chest discomfort or lower extremity swelling Gastrointestinal:  Denies nausea, heartburn or change in bowel habits Skin: Denies abnormal skin rashes Lymphatics: Denies new lymphadenopathy or easy bruising Neurological:Denies numbness, tingling or new weaknesses Behavioral/Psych: Mood is stable, no new changes  All other systems were reviewed with the patient and are negative.  PHYSICAL EXAMINATION: ECOG PERFORMANCE STATUS: 1 - Symptomatic but completely ambulatory  Vitals:   06/06/20 1037  BP: (!) 167/77  Pulse: 87  Resp: 16  Temp: 98.7 F (37.1 C)  SpO2: 100%   Filed Weights   06/06/20 1037  Weight: 288 lb 6.4 oz (130.8 kg)    GENERAL:alert, no distress and comfortable NEURO: alert & oriented x 3 with fluent speech, no focal motor/sensory deficits  LABORATORY DATA:  I have reviewed the data as listed     Component Value Date/Time   NA 139 02/11/2017 1000   K 3.4 (L) 02/11/2017 1000   CL 103 02/28/2012 1607   CO2 26 02/11/2017 1000   GLUCOSE 108 02/11/2017 1000   BUN 12.0 02/11/2017 1000   CREATININE 0.7 02/11/2017 1000   CALCIUM 9.1 02/11/2017 1000   PROT 7.3 02/11/2017 1000   ALBUMIN 3.5 02/11/2017 1000   AST 15 02/11/2017 1000   ALT 15 02/11/2017 1000   ALKPHOS 48 02/11/2017 1000   BILITOT <0.22 02/11/2017 1000    No results found for: SPEP, UPEP  Lab Results  Component Value Date   WBC 6.5 04/04/2020   NEUTROABS 3.7 04/04/2020   HGB 11.0 (L) 04/04/2020   HCT 38.5 04/04/2020   MCV 79.4 (L) 04/04/2020   PLT 340 04/04/2020      Chemistry      Component Value Date/Time   NA 139 02/11/2017 1000   K 3.4 (L) 02/11/2017 1000   CL 103 02/28/2012 1607   CO2 26 02/11/2017 1000   BUN 12.0 02/11/2017 1000   CREATININE 0.7 02/11/2017 1000       Component Value Date/Time   CALCIUM 9.1 02/11/2017 1000   ALKPHOS 48 02/11/2017 1000   AST 15 02/11/2017 1000   ALT 15 02/11/2017 1000   BILITOT <0.22 02/11/2017 1000

## 2020-06-06 NOTE — Patient Instructions (Signed)
[facility]  Discharge Instructions: Thank you for choosing [facility] to provide your oncology and hematology care.   If you have a lab appointment with the Cancer Center, please go directly to the Cancer Center and check in at the registration area.   Wear comfortable clothing and clothing appropriate for easy access to any Portacath or PICC line.   We strive to give you quality time with your provider. You may need to reschedule your appointment if you arrive late (15 or more minutes).  Arriving late affects you and other patients whose appointments are after yours.  Also, if you miss three or more appointments without notifying the office, you may be dismissed from the clinic at the provider's discretion.      For prescription refill requests, have your pharmacy contact our office and allow 72 hours for refills to be completed.    Today you received the following chemotherapy and/or immunotherapy agents venofer     To help prevent nausea and vomiting after your treatment, we encourage you to take your nausea medication as directed.  BELOW ARE SYMPTOMS THAT SHOULD BE REPORTED IMMEDIATELY: *FEVER GREATER THAN 100.4 F (38 C) OR HIGHER *CHILLS OR SWEATING *NAUSEA AND VOMITING THAT IS NOT CONTROLLED WITH YOUR NAUSEA MEDICATION *UNUSUAL SHORTNESS OF BREATH *UNUSUAL BRUISING OR BLEEDING *URINARY PROBLEMS (pain or burning when urinating, or frequent urination) *BOWEL PROBLEMS (unusual diarrhea, constipation, pain near the anus) TENDERNESS IN MOUTH AND THROAT WITH OR WITHOUT PRESENCE OF ULCERS (sore throat, sores in mouth, or a toothache) UNUSUAL RASH, SWELLING OR PAIN  UNUSUAL VAGINAL DISCHARGE OR ITCHING   Items with * indicate a potential emergency and should be followed up as soon as possible or go to the Emergency Department if any problems should occur.  Please show the CHEMOTHERAPY ALERT CARD or IMMUNOTHERAPY ALERT CARD at check-in to the  Emergency Department and triage nurse.  Should you have questions after your visit or need to cancel or reschedule your appointment, please contact [facility]  Dept: [phone]  and follow the prompts.  Office hours are 8:00 a.m. to 4:30 p.m. Monday - Friday. Please note that voicemails left after 4:00 p.m. may not be returned until the following business day.  We are closed weekends and major holidays. You have access to a nurse at all times for urgent questions. Please call the main number to the clinic Dept: [phone] and follow the prompts.   For any non-urgent questions, you may also contact your provider using MyChart. We now offer e-Visits for anyone 18 and older to request care online for non-urgent symptoms. For details visit [email or web address].   Also download the MyChart app! Go to the app store, search "MyChart", open the app, select , and log in with your MyChart username and password.  Due to Covid, a mask is required upon entering the hospital/clinic. If you do not have a mask, one will be given to you upon arrival. For doctor visits, patients may have 1 support person aged 18 or older with them. For treatment visits, patients cannot have anyone with them due to current Covid guidelines and our immunocompromised population.   

## 2020-06-06 NOTE — Progress Notes (Signed)
Pt declined to stay for 30 min observation. VSS throughout treatment, and tolerated well. Pt discharged in stable condition., ambulatory to lobby.

## 2020-06-06 NOTE — Assessment & Plan Note (Signed)
She has persistent symptoms She did not tolerate doxycycline well I will prescribe a course of amoxicillin for her

## 2020-06-06 NOTE — Assessment & Plan Note (Signed)
She is currently on birth control pill Her recent endometrial biopsy showed no evidence of malignancy I would defer to her gynecologist for further management

## 2020-06-10 ENCOUNTER — Telehealth: Payer: Self-pay | Admitting: Hematology and Oncology

## 2020-06-10 NOTE — Telephone Encounter (Signed)
Scheduled appts per 4/29 sch msg. Pt aware.  

## 2020-08-22 ENCOUNTER — Telehealth: Payer: Self-pay

## 2020-08-22 NOTE — Telephone Encounter (Signed)
RN received labs from outside lab for CBC and Iron levels.   MD reviewed - recommendations if patient is symptomatic to move upcoming appointments to a date sooner.   RN spoke with patient, patient is feeling a little tired, and at time experiences dizziness.  Patient requested that appointments remain the same for now due to scheduling conflicts with other dates.  RN encouraged patient to notify clinic if symptoms worsen, verbalized understanding.

## 2020-09-05 ENCOUNTER — Other Ambulatory Visit: Payer: Self-pay

## 2020-09-05 ENCOUNTER — Inpatient Hospital Stay: Payer: BC Managed Care – PPO

## 2020-09-05 ENCOUNTER — Inpatient Hospital Stay: Payer: BC Managed Care – PPO | Admitting: Hematology and Oncology

## 2020-09-05 ENCOUNTER — Inpatient Hospital Stay: Payer: BC Managed Care – PPO | Attending: Hematology and Oncology

## 2020-09-05 ENCOUNTER — Encounter: Payer: Self-pay | Admitting: Hematology and Oncology

## 2020-09-05 VITALS — BP 168/83 | HR 74 | Temp 97.7°F | Resp 18 | Ht 64.0 in | Wt 285.6 lb

## 2020-09-05 DIAGNOSIS — D500 Iron deficiency anemia secondary to blood loss (chronic): Secondary | ICD-10-CM | POA: Insufficient documentation

## 2020-09-05 DIAGNOSIS — N920 Excessive and frequent menstruation with regular cycle: Secondary | ICD-10-CM | POA: Insufficient documentation

## 2020-09-05 DIAGNOSIS — R030 Elevated blood-pressure reading, without diagnosis of hypertension: Secondary | ICD-10-CM | POA: Diagnosis not present

## 2020-09-05 DIAGNOSIS — R5383 Other fatigue: Secondary | ICD-10-CM

## 2020-09-05 LAB — CBC WITH DIFFERENTIAL/PLATELET
Abs Immature Granulocytes: 0.03 10*3/uL (ref 0.00–0.07)
Basophils Absolute: 0 10*3/uL (ref 0.0–0.1)
Basophils Relative: 1 %
Eosinophils Absolute: 0.2 10*3/uL (ref 0.0–0.5)
Eosinophils Relative: 3 %
HCT: 31.7 % — ABNORMAL LOW (ref 36.0–46.0)
Hemoglobin: 9.5 g/dL — ABNORMAL LOW (ref 12.0–15.0)
Immature Granulocytes: 0 %
Lymphocytes Relative: 40 %
Lymphs Abs: 2.8 10*3/uL (ref 0.7–4.0)
MCH: 22.7 pg — ABNORMAL LOW (ref 26.0–34.0)
MCHC: 30 g/dL (ref 30.0–36.0)
MCV: 75.8 fL — ABNORMAL LOW (ref 80.0–100.0)
Monocytes Absolute: 0.6 10*3/uL (ref 0.1–1.0)
Monocytes Relative: 8 %
Neutro Abs: 3.4 10*3/uL (ref 1.7–7.7)
Neutrophils Relative %: 48 %
Platelets: 313 10*3/uL (ref 150–400)
RBC: 4.18 MIL/uL (ref 3.87–5.11)
RDW: 15.9 % — ABNORMAL HIGH (ref 11.5–15.5)
WBC: 7.1 10*3/uL (ref 4.0–10.5)
nRBC: 0 % (ref 0.0–0.2)

## 2020-09-05 LAB — FERRITIN: Ferritin: 37 ng/mL (ref 11–307)

## 2020-09-05 LAB — IRON AND TIBC
Iron: 35 ug/dL — ABNORMAL LOW (ref 41–142)
Saturation Ratios: 11 % — ABNORMAL LOW (ref 21–57)
TIBC: 329 ug/dL (ref 236–444)
UIBC: 294 ug/dL (ref 120–384)

## 2020-09-05 MED ORDER — FAMOTIDINE 20 MG IN NS 100 ML IVPB
INTRAVENOUS | Status: AC
  Filled 2020-09-05: qty 100

## 2020-09-05 MED ORDER — LORATADINE 10 MG PO TABS
ORAL_TABLET | ORAL | Status: AC
  Filled 2020-09-05: qty 1

## 2020-09-05 MED ORDER — FAMOTIDINE 20 MG IN NS 100 ML IVPB
20.0000 mg | Freq: Once | INTRAVENOUS | Status: AC
  Administered 2020-09-05: 20 mg via INTRAVENOUS

## 2020-09-05 MED ORDER — LORATADINE 10 MG PO TABS
10.0000 mg | ORAL_TABLET | Freq: Once | ORAL | Status: AC
  Administered 2020-09-05: 10 mg via ORAL

## 2020-09-05 MED ORDER — DIPHENHYDRAMINE HCL 25 MG PO CAPS
ORAL_CAPSULE | ORAL | Status: AC
  Filled 2020-09-05: qty 1

## 2020-09-05 MED ORDER — DIPHENHYDRAMINE HCL 25 MG PO CAPS
25.0000 mg | ORAL_CAPSULE | Freq: Once | ORAL | Status: AC
  Administered 2020-09-05: 25 mg via ORAL

## 2020-09-05 MED ORDER — SODIUM CHLORIDE 0.9 % IV SOLN
300.0000 mg | Freq: Once | INTRAVENOUS | Status: AC
  Administered 2020-09-05: 300 mg via INTRAVENOUS
  Filled 2020-09-05: qty 300

## 2020-09-05 MED ORDER — SODIUM CHLORIDE 0.9 % IV SOLN
INTRAVENOUS | Status: DC
  Filled 2020-09-05: qty 250

## 2020-09-05 NOTE — Assessment & Plan Note (Addendum)
Based on her ongoing recent bleeding and her symptoms, we will proceed with IV iron today She does not need blood transfusion I recommend 3 IV iron dose and then see her back after her hysterectomy If she remains anemic after hysterectomy, we might have to refer her back to GI service for repeat endoscopy evaluation to rule out GI blood loss

## 2020-09-05 NOTE — Assessment & Plan Note (Signed)
She is scheduled for hysterectomy next month I plan to see her within a week of surgery in case she need transfusion support

## 2020-09-05 NOTE — Progress Notes (Signed)
[facility] OFFICE PROGRESS NOTE  [name], DO  ASSESSMENT & PLAN:  Iron deficiency anemia Based on her ongoing recent bleeding and her symptoms, we will proceed with IV iron today She does not need blood transfusion I recommend 3 IV iron dose and then see her back after her hysterectomy If she remains anemic after hysterectomy, we might have to refer her back to GI service for repeat endoscopy evaluation to rule out GI blood loss  Menorrhagia with regular cycle She is scheduled for hysterectomy next month I plan to see her within a week of surgery in case she need transfusion support  Elevated BP without diagnosis of hypertension This could be attributed to anxiety Observe closely for now  Orders Placed This Encounter  Procedures   Sample to Blood Bank    Standing Status:   Standing    Number of Occurrences:   3    Standing Expiration Date:   09/05/2021    The total time spent in the appointment was 25 minutes encounter with patients including review of chart and various tests results, discussions about plan of care and coordination of care plan   All questions were answered. The patient knows to call the clinic with any problems, questions or concerns. No barriers to learning was detected.    [name], MD 7/29/202210:14 AM  INTERVAL HISTORY: [name] [name] 45 y.o. female returns for further follow-up on iron deficiency anemia She complained of fatigue She is scheduled for hysterectomy next month She continues to have heavy menstruation Denies other forms of bleeding No infusion reaction to iron sucrose  SUMMARY OF HEMATOLOGIC HISTORY:  She was found to have abnormal CBC from recent blood work for evaluation of fatigue. She denies recent chest pain on exertion, pre-syncopal episodes, or palpitations. She does complain of leg cramps, dizziness, shortness of breath on minimal exertion, and frequent headaches. Recent CBC done last month in May 2015  shows significant anemia with hemoglobin 7.9, MCV of 55 and platelet count of 409. She received one dose of intravenous iron on 07/23/2013. After infusion, she complained of scratchy throat and developed hives. Subsequently, she received premedication with Solu-Medrol and she was able to complete her treatment in July 2015 She received further IV iron in June 2017 for recurrent iron deficiency anemia In February 2019, EGD and colonoscopy excluded GI blood loss On 06/29/2017, she underwent D&C and polypectomy She has received several doses of intravenous iron in 2019 In 2021, she received blood transfusion and intravenous iron due to severe menorrhagia  I have reviewed the past medical history, past surgical history, social history and family history with the patient and they are unchanged from previous note.  ALLERGIES:  is allergic to feraheme [ferumoxytol].  MEDICATIONS:  Current Outpatient Medications  Medication Sig Dispense Refill   amoxicillin (AMOXIL) 500 MG tablet Take 1 tablet (500 mg total) by mouth 2 (two) times daily. 14 tablet 0   fluticasone (FLONASE) 50 MCG/ACT nasal spray Place 2 sprays into both nostrils daily. 1 g 0   prochlorperazine (COMPAZINE) 10 MG tablet Take 1 tablet (10 mg total) by mouth every 6 (six) hours as needed for nausea or vomiting. 30 tablet 1   tiZANidine (ZANAFLEX) 2 MG tablet Take 1-2 tablets (2-4 mg total) by mouth every 6 (six) hours as needed for muscle spasms. 30 tablet 0   No current facility-administered medications for this visit.   Facility-Administered Medications Ordered in Other Visits  Medication Dose Route Frequency Provider  Last Rate Last Admin   0.9 %  sodium chloride infusion   Intravenous Continuous Alvy Bimler, Jehan Bonano, MD 20 mL/hr at 09/05/20 1000 New Bag at 09/05/20 1000   famotidine (PEPCID) IVPB 20 mg in NS 100 mL IVPB  20 mg Intravenous Once Alvy Bimler, Matasha Smigelski, MD 400 mL/hr at 09/05/20 1006 20 mg at 09/05/20 1006   iron sucrose (VENOFER) 300 mg  in sodium chloride 0.9 % 250 mL IVPB  300 mg Intravenous Once [name], MD         REVIEW OF SYSTEMS:   Constitutional: Denies fevers, chills or night sweats Eyes: Denies blurriness of vision Ears, nose, mouth, throat, and face: Denies mucositis or sore throat Respiratory: Denies cough, dyspnea or wheezes Cardiovascular: Denies palpitation, chest discomfort or lower extremity swelling Gastrointestinal:  Denies nausea, heartburn or change in bowel habits Skin: Denies abnormal skin rashes Lymphatics: Denies new lymphadenopathy or easy bruising Neurological:Denies numbness, tingling or new weaknesses Behavioral/Psych: Mood is stable, no new changes  All other systems were reviewed with the patient and are negative.  PHYSICAL EXAMINATION: ECOG PERFORMANCE STATUS: 1 - Symptomatic but completely ambulatory  Vitals:   09/05/20 0909  BP: (!) 168/83  Pulse: 74  Resp: 18  Temp: 97.7 F (36.5 C)  SpO2: 100%   Filed Weights   09/05/20 0909  Weight: 285 lb 9.6 oz (129.5 kg)    GENERAL:alert, no distress and comfortable Musculoskeletal:no cyanosis of digits and no clubbing  NEURO: alert & oriented x 3 with fluent speech, no focal motor/sensory deficits  LABORATORY DATA:  I have reviewed the data as listed     Component Value Date/Time   NA 139 02/11/2017 1000   K 3.4 (L) 02/11/2017 1000   CL 103 02/28/2012 1607   CO2 26 02/11/2017 1000   GLUCOSE 108 02/11/2017 1000   BUN 12.0 02/11/2017 1000   CREATININE 0.7 02/11/2017 1000   CALCIUM 9.1 02/11/2017 1000   PROT 7.3 02/11/2017 1000   ALBUMIN 3.5 02/11/2017 1000   AST 15 02/11/2017 1000   ALT 15 02/11/2017 1000   ALKPHOS 48 02/11/2017 1000   BILITOT <0.22 02/11/2017 1000    No results found for: SPEP, UPEP  Lab Results  Component Value Date   WBC 7.1 09/05/2020   NEUTROABS 3.4 09/05/2020   HGB 9.5 (L) 09/05/2020   HCT 31.7 (L) 09/05/2020   MCV 75.8 (L) 09/05/2020   PLT 313 09/05/2020      Chemistry       Component Value Date/Time   NA 139 02/11/2017 1000   K 3.4 (L) 02/11/2017 1000   CL 103 02/28/2012 1607   CO2 26 02/11/2017 1000   BUN 12.0 02/11/2017 1000   CREATININE 0.7 02/11/2017 1000      Component Value Date/Time   CALCIUM 9.1 02/11/2017 1000   ALKPHOS 48 02/11/2017 1000   AST 15 02/11/2017 1000   ALT 15 02/11/2017 1000   BILITOT <0.22 02/11/2017 1000

## 2020-09-05 NOTE — Patient Instructions (Signed)

## 2020-09-05 NOTE — Assessment & Plan Note (Signed)
This could be attributed to anxiety Observe closely for now

## 2020-09-08 ENCOUNTER — Telehealth: Payer: Self-pay | Admitting: Hematology and Oncology

## 2020-09-08 NOTE — Telephone Encounter (Signed)
R/s appt per Pearson Grippe. Pt is aware of change.

## 2020-09-09 ENCOUNTER — Encounter (HOSPITAL_BASED_OUTPATIENT_CLINIC_OR_DEPARTMENT_OTHER): Payer: Self-pay | Admitting: Obstetrics and Gynecology

## 2020-09-09 DIAGNOSIS — E559 Vitamin D deficiency, unspecified: Secondary | ICD-10-CM

## 2020-09-09 DIAGNOSIS — G4700 Insomnia, unspecified: Secondary | ICD-10-CM

## 2020-09-09 DIAGNOSIS — Z973 Presence of spectacles and contact lenses: Secondary | ICD-10-CM

## 2020-09-09 HISTORY — DX: Insomnia, unspecified: G47.00

## 2020-09-09 HISTORY — DX: Presence of spectacles and contact lenses: Z97.3

## 2020-09-09 HISTORY — DX: Vitamin D deficiency, unspecified: E55.9

## 2020-09-09 NOTE — Progress Notes (Signed)
Spoke w/ via phone for pre-op interview---pt Lab needs dos----    urine poct per anesthesia surgery orders pending           Lab results------lab appt 09-15-2020 1400 cbc t & s, ekg 05-29-2020 epic, lov oncolgy dr Lottie Rater 09-05-2020 epic COVID test -----09-15-2020  Arrive at -------11 am 09-17-2020 NPO after MN NO Solid Food.  Clear liquids from MN until---530 am then npo Med rec completed Medications to take morning of surgery -----none Diabetic medication -----n/a Patient instructed no nail polish to be worn day of surgery Patient instructed to bring photo id and insurance card day of surgery Patient aware to have Driver (ride ) / caregiver   mother Herold Harms  for 24 hours after surgery  Patient Special Instructions -----pt given extended stay instructions Pre-Op special Istructions -----surgery odrers req dr Landry Mellow epic ib Patient verbalized understanding of instructions that were given at this phone interview. Patient denies shortness of breath, chest pain, fever, cough at this phone interview.   Spoke with dr c bass mda and reviewed pt history and bmi 48.75 and pt ok for [facility] if labs ok with 09-15-2020 lab visit per dr c bass mda.

## 2020-09-09 NOTE — Progress Notes (Signed)
YOU ARE SCHEDULED FOR A COVID TEST ON   09-15-2020  . THIS TEST MUST BE DONE BEFORE SURGERY. GO TO  [facility] [place] IN YOUR CAR, THIS IS A DRIVE UP TEST.       Your procedure is scheduled on 09-17-2020  Report to [facility].   Call this number if you have problems the morning of surgery  :[phone].   OUR ADDRESS IS [address].  WE ARE LOCATED IN [facility].  PLEASE BRING YOUR INSURANCE CARD AND PHOTO ID DAY OF SURGERY.  ONLY ONE PERSON ALLOWED IN FACILITY WAITING AREA.                                     REMEMBER:  DO NOT EAT FOOD, CANDY GUM OR MINTS  AFTER MIDNIGHT . YOU MAY HAVE CLEAR LIQUIDS FROM MIDNIGHT UNTIL 530 AM NO CLEAR LIQUIDS AFTER _530 AM DAY OF SURGERY.   YOU MAY  BRUSH YOUR TEETH MORNING OF SURGERY AND RINSE YOUR MOUTH OUT, NO CHEWING GUM CANDY OR MINTS.    CLEAR LIQUID DIET   Foods Allowed                                                                     Foods Excluded  Coffee and tea, regular and decaf                             liquids that you cannot  Plain Jell-O any favor except red or purple                                           see through such as: Fruit ices (not with fruit pulp)                                     milk, soups, orange juice  Iced Popsicles                                    All solid food Carbonated beverages, regular and diet                                    Cranberry, grape and apple juices Sports drinks like Gatorade Lightly seasoned clear broth or consume(fat free) Sugar, honey syrup  Sample Menu Breakfast                                Lunch  Supper Cranberry juice                    Beef broth                            Chicken broth Jell-O                                     Grape juice                           Apple juice Coffee or tea                         Jell-O                                      Popsicle                                                Coffee or tea                        Coffee or tea  _____________________________________________________________________     TAKE THESE MEDICATIONS MORNING OF SURGERY WITH A SIP OF WATER:  NONE  ONE VISITOR IS ALLOWED IN WAITING ROOM ONLY DAY OF SURGERY.  NO VISITOR MAY SPEND THE NIGHT.  VISITOR ARE ALLOWED TO STAY UNTIL 800 PM.                                    DO NOT WEAR JEWERLY, MAKE UP. DO NOT WEAR LOTIONS, POWDERS, PERFUMES OR NAIL POLISH. DO NOT SHAVE FOR 48 HOURS PRIOR TO DAY OF SURGERY. MEN MAY SHAVE FACE AND NECK. CONTACTS, GLASSES, OR DENTURES MAY NOT BE WORN TO SURGERY.                                    [facility] IS NOT RESPONSIBLE  FOR ANY BELONGINGS.                                                                    Marland Kitchen           [place] - Preparing for Surgery Before surgery, you can play an important role.  Because skin is not sterile, your skin needs to be as free of germs as possible.  You can reduce the number of germs on your skin by washing with CHG (chlorahexidine gluconate) soap before surgery.  CHG is an antiseptic cleaner which kills germs and bonds with the skin to continue killing germs even after washing. Please DO NOT use if you have an allergy to CHG or antibacterial soaps.  If your skin becomes reddened/irritated  stop using the CHG and inform your nurse when you arrive at Short Stay. Do not shave (including legs and underarms) for at least 48 hours prior to the first CHG shower.  You may shave your face/neck. Please follow these instructions carefully:  1.  Shower with CHG Soap the night before surgery and the  morning of Surgery.  2.  If you choose to wash your hair, wash your hair first as usual with your  normal  shampoo.  3.  After you shampoo, rinse your hair and body thoroughly to remove the  shampoo.                            4.  Use CHG as you  would any other liquid soap.  You can apply chg directly  to the skin and wash                      Gently with a scrungie or clean washcloth.  5.  Apply the CHG Soap to your body ONLY FROM THE NECK DOWN.   Do not use on face/ open                           Wound or open sores. Avoid contact with eyes, ears mouth and genitals (private parts).                       Wash face,  Genitals (private parts) with your normal soap.             6.  Wash thoroughly, paying special attention to the area where your surgery  will be performed.  7.  Thoroughly rinse your body with warm water from the neck down.  8.  DO NOT shower/wash with your normal soap after using and rinsing off  the CHG Soap.                9.  Pat yourself dry with a clean towel.            10.  Wear clean pajamas.            11.  Place clean sheets on your bed the night of your first shower and do not  sleep with pets. Day of Surgery : Do not apply any lotions/deodorants the morning of surgery.  Please wear clean clothes to the hospital/surgery center.  FAILURE TO FOLLOW THESE INSTRUCTIONS MAY RESULT IN THE CANCELLATION OF YOUR SURGERY PATIENT SIGNATURE_________________________________  NURSE SIGNATURE__________________________________  ________________________________________________________________________                                                        QUESTIONS Hansel Feinstein PRE OP NURSE PHONE [phone].

## 2020-09-11 ENCOUNTER — Other Ambulatory Visit: Payer: Self-pay | Admitting: Obstetrics and Gynecology

## 2020-09-11 NOTE — H&P (Deleted)
  The note originally documented on this encounter has been moved the the encounter in which it belongs.  

## 2020-09-11 NOTE — H&P (Signed)
Patient:[name], [name], [name] DOB: 1976-01-22 Age: 45 Y Sex: Female Phone: [phone] Primary Insurance: [employer] Payer ID: [id number] Address: [address], [place], [place] Account Number: [id number] PCP: [facility] Date: 09/02/2020 Provider: Christophe Louis, MD Appointment [facility]: [facility]   Subjective: Chief Complaint(s):   Preop- fibroids/ heavy menses/ anemia   HPI:  Isolation Precautions Yes- Moderna" label="Has patient received COVID-19 vaccination?" propId="[id number]" catId="[id number]" encId="[id number]"Has patient received COVID-19 vaccination? Yes- Moderna" itemId="25066" categoryId="477813"Yes- Moderna. Does patient report new onset of COVID symptoms? No. Has patient or close contact tested positive for COVID-19? No , not in the past 2 weeks.  General 45 yo pt presents for a pre-op visit for a robotic-assisted laparoscopic hysterectomy.  [place] 2/29/2022 to evaluate fibroids. US shows multiple fibroids were seen, six were measured; largest 3.6 cm. One of the fibroids was pushing into the endometrial cavity. Pt had a sonohysterogram on 04/09/2020 to evaluate the degree the fibroid is affecting the cavity.  Results from sonohysterogram on 04/09/2020:  Antedated uterus. Enlarged UT with multiple UT fibroids. Endom thickened and distorted due to fibroids.  Hyperechoic mass ant wall 2.0cm X 1.1 cm  Post submucosal fibroid seen -1.9 cm X 1.5 cm, pushes slightly on endometrium cavity  Endometrium walls- ant wall 3.1 mm, post wall 3.8 mm; total thickness= 6.9 mm  [place] results completed February on Febuary 23rd, 2022:  Uterus: 14.0 cm X 8.7 cm X 9.9 cm  Endometrium: 1.04 cm  Findings: multiple fibroids noted, the largest measuring 3.6 cm  Pt has a robotic-assisted laparoscopic hysterectomy scheduled for August 10th, 2022 at 8:30 for management of anemia, menorrhagia, and fibroids. Current Medication: Taking  Trazamine(traZODone & Diet Manage Prod) 50 MG  Miscellaneous 1/2 tablet Orally as directed, Notes: prn.     Camila(Norethindrone) 0.35 MG Tablet 1 tablet Orally Once a day.     Benadryl(diphenhydrAMINE HCl) , Notes: PRN.     Dramamine(dimenhyDRINATE) 50 MG Tablet 1 tablet Orally daily as needed, Notes: prn.     Vitamin D (Ergocalciferol) 50000 UNIT Capsule 1 capsule Orally weekly, Notes: start OTC once rx completed.   Not-Taking  Maxalt-MLT(Rizatriptan Benzoate) 10 MG Tablet Disintegrating 1 tablet on the tongue and allow to dissolve as needed one time Orally Take one at headache onset. May repeat once only in 2 hours.     Lysteda 650 MG Tablet 2 tablets Orally Three times daily.     Medication List reviewed and reconciled with the patient.  Medical History:  hypercholesterolemia     migraine headaches     polycystic ovary syndrome     ovarian cysts     fibroids     seasonal allergies     Iron deficiency anemia, unspecified iron deficiency anemia type     Vitamin D deficiency     Bilateral carpal tunnel syndrome     Essential (primary) hypertension     iron infusions      Allergies/Intolerance: N.K.D.A. Gyn History:  Sexual activity not currently sexually active. Periods : very heavy - bad cramps - nausea. LMP 09/01/2020. Birth control OCP. Last pap smear date 03/21/2020 - neg. Denies Last mammogram date 04/07/2018 - normal/benign. Denies Abnormal pap smear. Denies STD. Menarche 29. Trying to get pregnant yes .   OB History:  Number of pregnancies 3. miscarriages 1. Pregnancy # 1 miscarriage 1993--age 45. Pregnancy # 2 09/24/06 , live birth, vaginal delivery, girl "[place]", birth weight 5.2. Pregnancy # 3 live birth, vaginal delivery, girl.  Surgical History:  laparoscopy/ovarian cyst 1998     lymph node removal from throat 2006     Carpal Tunnel Surgery-Right 04/2016     Right Foot Surgery     hysterosocpy D&C with removal of endometrial mass 06/29/2017   Hospitalization:  Denies Past Hospitalization   Family  History:  Father: unknown    Mother: alive, hypertension, fibroids but had hysterectomy, diagnosed with Hypertension    2 daughter(s) .    denies family HX gyn cancers, No Family History of Colon Cancer, Polyps, or Liver Disease.  Social History: General Tobacco use cigarettes: Never smoked, Tobacco history last updated 09/02/2020, Additional Findings: Tobacco Non-User Non-smoker for personal reasons, Vaping No.  no EXPOSURE TO PASSIVE SMOKE.  Alcohol: yes, occasionally.  Caffeine: yes, sweet tea.  no Recreational drug use.  Marital Status: single, Divorced.  Children: 2, girls.  OCCUPATION: employed, [employer].  COMMUNICATION BARRIERS: visual impairment, wears glasses.  ROS: CONSTITUTIONAL No" label="Chills" value="" options="no,yes" propid="91" itemid="193425" categoryid="10464" encounterid="[id number]"Chills No. No" label="Fatigue" value="" options="no,yes" propid="91" itemid="172899" categoryid="10464" encounterid="[id number]"Fatigue No. No" label="Fever" value="" options="no,yes" propid="91" itemid="10467" categoryid="10464" encounterid="[id number]"Fever No. No" label="Night sweats" value="" options="no,yes" propid="91" itemid="193426" categoryid="10464" encounterid="[id number]"Night sweats No. No" label="Recent travel outside [place]" value="" options="no,yes" propid="91" itemid="444261" categoryid="10464" encounterid="[id number]"Recent travel outside [place] No. No" label="Sweats" value="" options="no,yes" propid="91" itemid="193427" categoryid="10464" encounterid="[id number]"Sweats No. No" label="Weight change" value="" options="no,yes" propid="91" itemid="194825" categoryid="10464" encounterid="[id number]"Weight change No.  OPHTHALMOLOGY no" label="Blurring of vision" value="" options="no,yes" propid="91" itemid="12520" categoryid="12516" encounterid="[id number]"Blurring of vision no. no" label="Change in vision" value="" options="no,yes" propid="91" itemid="193469" categoryid="12516" encounterid="[id number]"Change in  vision no. no" label="Double vision" value="" options="no,yes" propid="91" itemid="194379" categoryid="12516" encounterid="[id number]"Double vision no.  ENT no" label="Dizziness" value="" options="no,yes" propid="91" itemid="193612" categoryid="10481" encounterid="[id number]"Dizziness no. Nose bleeds no. Sore throat no. Teeth pain no.  ALLERGY no" label="Hives" value="" options="no,yes" propid="91" itemid="202589" categoryid="138152" encounterid="[id number]"Hives no.  CARDIOLOGY no" label="Chest pain" value="" options="no,yes" propid="91" itemid="193603" categoryid="10488" encounterid="[id number]"Chest pain no. no" label="High blood pressure" value="" options="no,yes" propid="91" itemid="199089" categoryid="10488" encounterid="[id number]"High blood pressure no. no" label="Irregular heart beat" value="" options="no,yes" propid="91" itemid="202598" categoryid="10488" encounterid="[id number]"Irregular heart beat no. no" label="Leg edema" value="" options="no,yes" propid="91" itemid="10491" categoryid="10488" encounterid="[id number]"Leg edema no. no" label="Palpitations" value="" options="no,yes" propid="91" itemid="10490" categoryid="10488" encounterid="[id number]"Palpitations no.  RESPIRATORY no" label="Shortness of breath" value="" options="no" propid="91" itemid="270013" categoryid="138132" encounterid="[id number]"Shortness of breath no. no" label="Cough" value="" options="no,yes" propid="91" itemid="172745" categoryid="138132" encounterid="[id number]"Cough no. no" label="Wheezing" value="" options="no,yes" propid="91" itemid="193621" categoryid="138132" encounterid="[id number]"Wheezing no.  UROLOGY no" label="Pain with urination" value="" options="no,yes" propid="91" itemid="194377" categoryid="138166" encounterid="[id number]"Pain with urination no. no" label="Urinary urgency" value="" options="no,yes" propid="91" itemid="193493" categoryid="138166" encounterid="[id number]"Urinary urgency no. no" label="Urinary frequency" value=""  options="no,yes" propid="91" itemid="193492" categoryid="138166" encounterid="[id number]"Urinary frequency no. no" label="Urinary incontinence" value="" options="no,yes" propid="91" itemid="138171" categoryid="138166" encounterid="[id number]"Urinary incontinence no. No" label="Difficulty urinating" value="" options="no,yes" propid="91" itemid="138167" categoryid="138166" encounterid="[id number]"Difficulty urinating No. No" label="Blood in urine" value="" options="no,yes" propid="91" itemid="138168" categoryid="138166" encounterid="[id number]"Blood in urine No.  GASTROENTEROLOGY no" label="Abdominal pain" value="" options="no,yes" propid="91" itemid="10496" categoryid="10494" encounterid="[id number]"Abdominal pain no. no" label="Appetite change" value="" options="no,yes" propid="91" itemid="193447" categoryid="10494" encounterid="[id number]"Appetite change no. no" label="Bloating/belching" value="" options="no,yes" propid="91" itemid="193448" categoryid="10494" encounterid="[id number]"Bloating/belching no. no" label="Blood in stool or on toilet paper" value="" options="no,yes" propid="91" itemid="10503" categoryid="10494" encounterid="[id number]"Blood in stool or on toilet paper no. no" label="Change in bowel movements" value="" options="no,yes" propid="91" itemid="199106" categoryid="10494" encounterid="[id number]"Change in bowel movements no. no" label="Constipation" value="" options="no,yes" propid="91" itemid="10501" categoryid="10494" encounterid="[id number]"Constipation no. no" label="Diarrhea" value="" options="no,yes" propid="91" itemid="10502" categoryid="10494" encounterid="[id number]"Diarrhea no. no" label="Difficulty swallowing" value="" options="no,yes" propid="91" itemid="199104" categoryid="10494" encounterid="[id number]"Difficulty swallowing no. no" label="Nausea" value="" options="no,yes" propid="91" itemid="10499" categoryid="10494" encounterid="[id number]"Nausea no.  FEMALE REPRODUCTIVE no" label="Vulvar pain" value=""  options="no,yes" propid="91" itemid="453725" categoryid="10525" encounterid="[id number]"Vulvar pain no. no" label="Vulvar rash" value="" options="no,yes" propid="91" itemid="453726"  categoryid="10525" encounterid="[id number]"Vulvar rash no. no" label="Abnormal vaginal bleeding" value="" options="no, yes" propid="91" itemid="444315" categoryid="10525" encounterid="[id number]"Abnormal vaginal bleeding no. no" label="Breast pain" value="" options="no,yes" propid="91" itemid="186083" categoryid="10525" encounterid="[id number]"Breast pain no. no" label="Nipple discharge" value="" options="no,yes" propid="91" itemid="186084" categoryid="10525" encounterid="[id number]"Nipple discharge no. no" label="Pain with intercourse" value="" options="no,yes" propid="91" itemid="275823" categoryid="10525" encounterid="[id number]"Pain with intercourse no. no" label="Pelvic pain" value="" options="no,yes" propid="91" itemid="186082" categoryid="10525" encounterid="13928792"Pelvic pain no. no" label="Unusual vaginal discharge" value="" options="no,yes" propid="91" itemid="278230" categoryid="10525" encounterid="[id number]"Unusual vaginal discharge no. no" label="Vaginal itching" value="" options="no,yes" propid="91" itemid="278942" categoryid="10525" encounterid="[id number]"Vaginal itching no.  MUSCULOSKELETAL no" label="Muscle aches" value="" options="no,yes" propid="91" itemid="193461" categoryid="10514" encounterid="13928792"Muscle aches no.  NEUROLOGY no" label="Headache" value="" options="no,yes" propid="91" itemid="12513" categoryid="12512" encounterid="[id number]"Headache no. no" label="Tingling/numbness" value="" options="no,yes" propid="91" itemid="12514" categoryid="12512" encounterid="[id number]"Tingling/numbness no. no" label="Weakness" value="" options="no,yes" propid="91" itemid="193468" categoryid="12512" encounterid="13928792"Weakness no.  PSYCHOLOGY no" label="Depression" value="" options="" propid="91" itemid="275919"  categoryid="10520" encounterid="13928792"Depression no. no" label="Anxiety" value="" options="no,yes" propid="91" itemid="172748" categoryid="10520" encounterid="13928792"Anxiety no. no" label="Nervousness" value="" options="no,yes" propid="91" itemid="199158" categoryid="10520" encounterid="13928792"Nervousness no. no" label="Sleep disturbances" value="" options="no,yes" propid="91" itemid="12502" categoryid="10520" encounterid="13928792"Sleep disturbances no. no " label="Suicidal ideation" value="" options="no,yes" propid="91" itemid="72718" categoryid="10520" encounterid="13928792"Suicidal ideation no .  ENDOCRINOLOGY no" label="Excessive thirst" value="" options="no,yes" propid="91" itemid="194628" categoryid="12508" encounterid="13928792"Excessive thirst no. no" label="Excessive urination" value="" options="no,yes" propid="91" itemid="196285" categoryid="12508" encounterid="13928792"Excessive urination no. no" label="Hair loss" value="" options="no, yes" propid="91" itemid="444314" categoryid="12508" encounterid="13928792"Hair loss no. no" label="Heat or cold intolerance" value="" options="" propid="91" itemid="447284" categoryid="12508" encounterid="13928792"Heat or cold intolerance no.  HEMATOLOGY/LYMPH no" label="Abnormal bleeding" value="" options="no,yes" propid="91" itemid="199152" categoryid="138157" encounterid="13928792"Abnormal bleeding no. no" label="Easy bruising" value="" options="no,yes" propid="91" itemid="170653" categoryid="138157" encounterid="13928792"Easy bruising no. no" label="Swollen glands" value="" options="no,yes" propid="91" itemid="138158" categoryid="138157" encounterid="13928792"Swollen glands no.  DERMATOLOGY no" label="New/changing skin lesion" value="" options="no,yes" propid="91" itemid="199126" categoryid="12503" encounterid="13928792"New/changing skin lesion no. no" label="Rash" value="" options="no,yes" propid="91" itemid="12504" categoryid="12503"  encounterid="13928792"Rash no. no" label="Sores" value="" options="" propid="91" itemid="444313" categoryid="12503" encounterid="13928792"Sores no.  Negative except as stated in HPI.  Objective: Vitals: Wt 287.8, Wt change 6.4 lb, Ht 64.25, BMI 49.01, Pulse sitting 78, BP sitting 164/80.  Past Results: Examination:  General Examination alert, oriented, NAD" label="CONSTITUTIONAL:" categoryPropId="10089" examid="193638"CONSTITUTIONAL: alert, oriented, NAD.  moist, warm" label="SKIN:" categoryPropId="10109" examid="193638"SKIN: moist, warm.  Conjunctiva clear" label="EYES:" categoryPropId="21468" examid="193638"EYES: Conjunctiva clear.  good I:E efffort noted" label="LUNGS:" categoryPropId="87" examid="193638"LUNGS: good I:E efffort noted.  HEART: heart sounds are normal, rhythm is regular, no murmur.  soft, non-tender/non-distended, bowel sounds present" label="ABDOMEN:" categoryPropId="88" examid="193638"ABDOMEN: soft, non-tender/non-distended, bowel sounds present.  normal external genitalia, labia - unremarkable, vagina - pink moist mucosa, no lesions or abnormal discharge, cervix - no discharge or lesions or CMT, adnexa - no masses or tenderness, uterus - nontender and normal size on palpation" label="FEMALE GENITOURINARY:" categoryPropId="13414" examid="193638"FEMALE GENITOURINARY: normal external genitalia, labia - unremarkable, vagina - pink moist mucosa, no lesions or abnormal discharge, cervix - no discharge or lesions or CMT, adnexa - no masses or tenderness, uterus - nontender and normal size on palpation.  affect normal, good eye contact" label="PSYCH:" categoryPropId="16316" examid="193638"PSYCH: affect normal, good eye contact.  Physical Examination: Pt aware of scribe services today.   Assessment: Assessment:  Menorrhagia with regular cycle - N92.0 (Primary)     Fibroids - D25.9     Anemia, unspecified type - D64.9     Plan: Treatment: Menorrhagia with regular cycle Notes:  Planning robotic assisted laparocopic hysterectomy with bilateral salpingectomy. Pt advised she will stay overnight, or 2 days if conversion to larger incision. She is advised that in order to be discharged from hospital, she will need to be able to ambulate, urinate, tolerate food, and take pain medication by mouth. Discussed risks of hysterectomy including but  not limited to infection, bleeding, conversion to larger incision, damage to her bowel, bladder, or ureters, with the need for further surgery. Discussed risk of blood transfusion and risk of HIV or hep B&C with blood transfusion. Pt is aware of risks and desires blood transfusion if needed. Pt advised to avoid NSAIDs (Aspirin, Aleve, Advil, Ibuprofen, Motrin) from now until surgery given risk of bleeding during surgery. She may take Tylenol for pain management. She is advised to avoid eating or drinking starting midnight prior to surgery. Discussed post-surgery avoidance of driving for 1 week and avoidance of lifting weight greater than 10 lbs or intercourse for 6-8 weeks after procedure. . Fibroids Notes: Planning robotic assisted laparocopic hysterectomy with bilateral salpingectomy. Pt advised she will stay overnight, or 2 days if conversion to larger incision. She is advised that in order to be discharged from hospital, she will need to be able to ambulate, urinate, tolerate food, and take pain medication by mouth. Discussed risks of hysterectomy including but not limited to infection, bleeding, conversion to larger incision, damage to her bowel, bladder, or ureters, with the need for further surgery. Discussed risk of blood transfusion and risk of HIV or hep B&C with blood transfusion. Pt is aware of risks and desires blood transfusion if needed. Pt advised to avoid NSAIDs (Aspirin, Aleve, Advil, Ibuprofen, Motrin) from now until surgery given risk of bleeding during surgery. She may take Tylenol for pain management. She is advised to avoid eating or  drinking starting midnight prior to surgery. Discussed post-surgery avoidance of driving for 1 week and avoidance of lifting weight greater than 10 lbs or intercourse for 6-8 weeks after procedure.. Anemia, unspecified type Notes: should improve after robotic hysterectomy.

## 2020-09-12 ENCOUNTER — Ambulatory Visit: Payer: BC Managed Care – PPO

## 2020-09-13 ENCOUNTER — Inpatient Hospital Stay: Payer: BC Managed Care – PPO | Attending: Hematology and Oncology

## 2020-09-13 DIAGNOSIS — Z79899 Other long term (current) drug therapy: Secondary | ICD-10-CM | POA: Insufficient documentation

## 2020-09-13 DIAGNOSIS — D500 Iron deficiency anemia secondary to blood loss (chronic): Secondary | ICD-10-CM | POA: Insufficient documentation

## 2020-09-13 DIAGNOSIS — N920 Excessive and frequent menstruation with regular cycle: Secondary | ICD-10-CM | POA: Insufficient documentation

## 2020-09-15 ENCOUNTER — Encounter (HOSPITAL_COMMUNITY)
Admission: RE | Admit: 2020-09-15 | Discharge: 2020-09-15 | Disposition: A | Discharge status: Home / Self Care | Payer: BC Managed Care – PPO | Source: Ambulatory Visit | Attending: Obstetrics and Gynecology | Admitting: Obstetrics and Gynecology

## 2020-09-15 ENCOUNTER — Other Ambulatory Visit: Payer: Self-pay

## 2020-09-15 DIAGNOSIS — Z01812 Encounter for preprocedural laboratory examination: Secondary | ICD-10-CM | POA: Diagnosis not present

## 2020-09-15 LAB — CBC
HCT: 35.1 % — ABNORMAL LOW (ref 36.0–46.0)
Hemoglobin: 10 g/dL — ABNORMAL LOW (ref 12.0–15.0)
MCH: 22.3 pg — ABNORMAL LOW (ref 26.0–34.0)
MCHC: 28.5 g/dL — ABNORMAL LOW (ref 30.0–36.0)
MCV: 78.3 fL — ABNORMAL LOW (ref 80.0–100.0)
Platelets: 410 10*3/uL — ABNORMAL HIGH (ref 150–400)
RBC: 4.48 MIL/uL (ref 3.87–5.11)
RDW: 17.2 % — ABNORMAL HIGH (ref 11.5–15.5)
WBC: 8 10*3/uL (ref 4.0–10.5)
nRBC: 0 % (ref 0.0–0.2)

## 2020-09-16 ENCOUNTER — Inpatient Hospital Stay: Payer: BC Managed Care – PPO

## 2020-09-16 VITALS — BP 155/63 | HR 88 | Temp 98.9°F | Resp 18

## 2020-09-16 DIAGNOSIS — D500 Iron deficiency anemia secondary to blood loss (chronic): Secondary | ICD-10-CM | POA: Diagnosis not present

## 2020-09-16 DIAGNOSIS — N920 Excessive and frequent menstruation with regular cycle: Secondary | ICD-10-CM | POA: Diagnosis present

## 2020-09-16 DIAGNOSIS — Z79899 Other long term (current) drug therapy: Secondary | ICD-10-CM | POA: Diagnosis not present

## 2020-09-16 MED ORDER — FAMOTIDINE 20 MG IN NS 100 ML IVPB
INTRAVENOUS | Status: AC
  Filled 2020-09-16: qty 100

## 2020-09-16 MED ORDER — SODIUM CHLORIDE 0.9 % IV SOLN
300.0000 mg | Freq: Once | INTRAVENOUS | Status: DC
  Filled 2020-09-16: qty 15

## 2020-09-16 MED ORDER — SODIUM CHLORIDE 0.9 % IV SOLN
2.0000 g | INTRAVENOUS | Status: AC
  Administered 2020-09-17: 2 g via INTRAVENOUS
  Filled 2020-09-16 (×2): qty 2

## 2020-09-16 MED ORDER — LORATADINE 10 MG PO TABS
ORAL_TABLET | ORAL | Status: AC
  Filled 2020-09-16: qty 1

## 2020-09-16 MED ORDER — FAMOTIDINE 20 MG IN NS 100 ML IVPB
20.0000 mg | Freq: Once | INTRAVENOUS | Status: AC
  Administered 2020-09-16: 20 mg via INTRAVENOUS

## 2020-09-16 MED ORDER — DIPHENHYDRAMINE HCL 25 MG PO CAPS
ORAL_CAPSULE | ORAL | Status: AC
  Filled 2020-09-16: qty 1

## 2020-09-16 MED ORDER — SODIUM CHLORIDE 0.9 % IV SOLN
300.0000 mg | Freq: Once | INTRAVENOUS | Status: AC
  Administered 2020-09-16: 300 mg via INTRAVENOUS
  Filled 2020-09-16: qty 300

## 2020-09-16 MED ORDER — SODIUM CHLORIDE 0.9 % IV SOLN
Freq: Once | INTRAVENOUS | Status: AC
  Filled 2020-09-16: qty 250

## 2020-09-16 MED ORDER — LORATADINE 10 MG PO TABS
10.0000 mg | ORAL_TABLET | Freq: Once | ORAL | Status: AC
  Administered 2020-09-16: 10 mg via ORAL

## 2020-09-16 MED ORDER — DIPHENHYDRAMINE HCL 25 MG PO CAPS
25.0000 mg | ORAL_CAPSULE | Freq: Once | ORAL | Status: AC
  Administered 2020-09-16: 25 mg via ORAL

## 2020-09-16 NOTE — Patient Instructions (Signed)

## 2020-09-16 NOTE — Anesthesia Preprocedure Evaluation (Addendum)
Anesthesia Evaluation  Patient identified by MRN, date of birth, ID band Patient awake    Reviewed: Allergy & Precautions, NPO status , Patient's Chart, lab work & pertinent test results  History of Anesthesia Complications (+) PONV  Airway Mallampati: I  TM Distance: >3 FB Neck ROM: Full    Dental no notable dental hx. (+) Teeth Intact, Dental Advisory Given   Pulmonary neg pulmonary ROS,    Pulmonary exam normal breath sounds clear to auscultation       Cardiovascular hypertension, Normal cardiovascular exam Rhythm:Regular Rate:Normal     Neuro/Psych negative neurological ROS  negative psych ROS   GI/Hepatic negative GI ROS, Neg liver ROS,   Endo/Other  Morbid obesity (BMI 48)PCOS  Renal/GU negative Renal ROS  negative genitourinary   Musculoskeletal negative musculoskeletal ROS (+)   Abdominal   Peds  Hematology  (+) Blood dyscrasia (Hgb 10), anemia ,   Anesthesia Other Findings   Reproductive/Obstetrics                            Anesthesia Physical Anesthesia Plan  ASA: 3  Anesthesia Plan: General   Post-op Pain Management:    Induction: Intravenous  PONV Risk Score and Plan: 4 or greater and Midazolam, Dexamethasone, Ondansetron, Scopolamine patch - Pre-op, Aprepitant and TIVA  Airway Management Planned: Oral ETT  Additional Equipment:   Intra-op Plan:   Post-operative Plan: Extubation in OR  Informed Consent: I have reviewed the patients History and Physical, chart, labs and discussed the procedure including the risks, benefits and alternatives for the proposed anesthesia with the patient or authorized representative who has indicated his/her understanding and acceptance.     Dental advisory given  Plan Discussed with: CRNA  Anesthesia Plan Comments:        Anesthesia Quick Evaluation

## 2020-09-16 NOTE — Progress Notes (Signed)
Patient declined to stay for 30 minute post observation, vitals stable and patient with no complaints.

## 2020-09-16 NOTE — Progress Notes (Signed)
Notified [name] [name] AD @ [facility] that pt's covid test done 09-15-2020 result has been entered in epic at of 1600.   Received notification from [place] that she contacted testing site and was told pt covid test is negative.

## 2020-09-17 ENCOUNTER — Encounter (HOSPITAL_BASED_OUTPATIENT_CLINIC_OR_DEPARTMENT_OTHER)
Admission: RE | Disposition: A | Discharge status: Home / Self Care | Payer: Self-pay | Source: Home / Self Care | Attending: Obstetrics and Gynecology

## 2020-09-17 ENCOUNTER — Observation Stay (HOSPITAL_BASED_OUTPATIENT_CLINIC_OR_DEPARTMENT_OTHER)
Admission: RE | Admit: 2020-09-17 | Discharge: 2020-09-18 | Disposition: A | Discharge status: Home / Self Care | Payer: BC Managed Care – PPO | Attending: Obstetrics and Gynecology | Admitting: Obstetrics and Gynecology

## 2020-09-17 ENCOUNTER — Other Ambulatory Visit: Payer: Self-pay

## 2020-09-17 ENCOUNTER — Encounter (HOSPITAL_BASED_OUTPATIENT_CLINIC_OR_DEPARTMENT_OTHER): Payer: Self-pay | Admitting: Obstetrics and Gynecology

## 2020-09-17 ENCOUNTER — Observation Stay (HOSPITAL_BASED_OUTPATIENT_CLINIC_OR_DEPARTMENT_OTHER): Payer: BC Managed Care – PPO | Admitting: Anesthesiology

## 2020-09-17 DIAGNOSIS — D259 Leiomyoma of uterus, unspecified: Secondary | ICD-10-CM | POA: Diagnosis not present

## 2020-09-17 DIAGNOSIS — D649 Anemia, unspecified: Secondary | ICD-10-CM | POA: Diagnosis not present

## 2020-09-17 DIAGNOSIS — N920 Excessive and frequent menstruation with regular cycle: Secondary | ICD-10-CM | POA: Diagnosis present

## 2020-09-17 DIAGNOSIS — D251 Intramural leiomyoma of uterus: Secondary | ICD-10-CM | POA: Diagnosis present

## 2020-09-17 DIAGNOSIS — I10 Essential (primary) hypertension: Secondary | ICD-10-CM | POA: Insufficient documentation

## 2020-09-17 DIAGNOSIS — N840 Polyp of corpus uteri: Secondary | ICD-10-CM | POA: Insufficient documentation

## 2020-09-17 DIAGNOSIS — Z90710 Acquired absence of both cervix and uterus: Secondary | ICD-10-CM | POA: Diagnosis present

## 2020-09-17 HISTORY — PX: ROBOTIC ASSISTED LAPAROSCOPIC HYSTERECTOMY AND SALPINGECTOMY: SHX6379

## 2020-09-17 HISTORY — DX: Nausea with vomiting, unspecified: Z98.890

## 2020-09-17 HISTORY — DX: Elevated blood-pressure reading, without diagnosis of hypertension: R03.0

## 2020-09-17 HISTORY — DX: Cardiac murmur, unspecified: R01.1

## 2020-09-17 HISTORY — DX: Nausea with vomiting, unspecified: R11.2

## 2020-09-17 LAB — TYPE AND SCREEN
ABO/RH(D): AB POS
Antibody Screen: NEGATIVE

## 2020-09-17 LAB — POCT PREGNANCY, URINE: Preg Test, Ur: NEGATIVE

## 2020-09-17 SURGERY — XI ROBOTIC ASSISTED LAPAROSCOPIC HYSTERECTOMY AND SALPINGECTOMY
Anesthesia: General | Site: Abdomen | Laterality: Bilateral

## 2020-09-17 MED ORDER — ACETAMINOPHEN 500 MG PO TABS
1000.0000 mg | ORAL_TABLET | ORAL | Status: AC
  Administered 2020-09-17: 1000 mg via ORAL

## 2020-09-17 MED ORDER — PROPOFOL 500 MG/50ML IV EMUL
INTRAVENOUS | Status: DC | PRN
  Administered 2020-09-17: 200 ug/kg/min via INTRAVENOUS

## 2020-09-17 MED ORDER — LACTATED RINGERS IV SOLN: INTRAVENOUS | Status: DC | PRN

## 2020-09-17 MED ORDER — SCOPOLAMINE 1 MG/3DAYS TD PT72
1.0000 | MEDICATED_PATCH | TRANSDERMAL | Status: DC
  Administered 2020-09-17: 1.5 mg via TRANSDERMAL

## 2020-09-17 MED ORDER — IBUPROFEN 200 MG PO TABS
ORAL_TABLET | ORAL | Status: AC
  Filled 2020-09-17: qty 3

## 2020-09-17 MED ORDER — ALBUTEROL SULFATE HFA 108 (90 BASE) MCG/ACT IN AERS
INHALATION_SPRAY | RESPIRATORY_TRACT | Status: DC | PRN
  Administered 2020-09-17: 4 via RESPIRATORY_TRACT

## 2020-09-17 MED ORDER — ONDANSETRON HCL 4 MG/2ML IJ SOLN
INTRAMUSCULAR | Status: AC
  Filled 2020-09-17: qty 2

## 2020-09-17 MED ORDER — APREPITANT 40 MG PO CAPS
ORAL_CAPSULE | ORAL | Status: AC
  Filled 2020-09-17: qty 1

## 2020-09-17 MED ORDER — ONDANSETRON HCL 4 MG/2ML IJ SOLN
4.0000 mg | Freq: Four times a day (QID) | INTRAMUSCULAR | Status: DC | PRN
  Administered 2020-09-17 – 2020-09-18 (×2): 4 mg via INTRAVENOUS

## 2020-09-17 MED ORDER — ONDANSETRON HCL 4 MG PO TABS: 4.0000 mg | ORAL_TABLET | Freq: Four times a day (QID) | ORAL | Status: DC | PRN

## 2020-09-17 MED ORDER — PROPOFOL 10 MG/ML IV BOLUS
INTRAVENOUS | Status: AC
  Filled 2020-09-17: qty 20

## 2020-09-17 MED ORDER — ALBUTEROL SULFATE HFA 108 (90 BASE) MCG/ACT IN AERS
INHALATION_SPRAY | RESPIRATORY_TRACT | Status: AC
  Filled 2020-09-17: qty 6.7

## 2020-09-17 MED ORDER — IBUPROFEN 800 MG PO TABS
ORAL_TABLET | ORAL | Status: AC
  Filled 2020-09-17: qty 1

## 2020-09-17 MED ORDER — LIDOCAINE HCL (PF) 2 % IJ SOLN
INTRAMUSCULAR | Status: AC
  Filled 2020-09-17: qty 5

## 2020-09-17 MED ORDER — KETAMINE HCL-SODIUM CHLORIDE 100-0.9 MG/10ML-% IV SOSY
PREFILLED_SYRINGE | INTRAVENOUS | Status: DC | PRN
  Administered 2020-09-17: 30 mg via INTRAVENOUS
  Administered 2020-09-17: 20 mg via INTRAVENOUS

## 2020-09-17 MED ORDER — SENNA 8.6 MG PO TABS
ORAL_TABLET | ORAL | Status: AC
  Filled 2020-09-17: qty 1

## 2020-09-17 MED ORDER — ACETAMINOPHEN 500 MG PO TABS
ORAL_TABLET | ORAL | Status: AC
  Filled 2020-09-17: qty 2

## 2020-09-17 MED ORDER — SODIUM CHLORIDE 0.9 % IR SOLN
Status: DC | PRN
  Administered 2020-09-17: 1500 mL

## 2020-09-17 MED ORDER — LABETALOL HCL 5 MG/ML IV SOLN
INTRAVENOUS | Status: AC
  Filled 2020-09-17: qty 4

## 2020-09-17 MED ORDER — FENTANYL CITRATE (PF) 100 MCG/2ML IJ SOLN
INTRAMUSCULAR | Status: AC
  Filled 2020-09-17: qty 2

## 2020-09-17 MED ORDER — MENTHOL 3 MG MT LOZG: 1.0000 | LOZENGE | OROMUCOSAL | Status: DC | PRN

## 2020-09-17 MED ORDER — LABETALOL HCL 5 MG/ML IV SOLN
20.0000 mg | Freq: Once | INTRAVENOUS | Status: AC
  Administered 2020-09-17: 20 mg via INTRAVENOUS

## 2020-09-17 MED ORDER — PANTOPRAZOLE SODIUM 40 MG PO TBEC
DELAYED_RELEASE_TABLET | ORAL | Status: AC
  Filled 2020-09-17: qty 1

## 2020-09-17 MED ORDER — ONDANSETRON HCL 4 MG/2ML IJ SOLN
INTRAMUSCULAR | Status: DC | PRN
  Administered 2020-09-17: 4 mg via INTRAVENOUS

## 2020-09-17 MED ORDER — LACTATED RINGERS IV SOLN: INTRAVENOUS | Status: DC

## 2020-09-17 MED ORDER — ROCURONIUM BROMIDE 10 MG/ML (PF) SYRINGE
PREFILLED_SYRINGE | INTRAVENOUS | Status: DC | PRN
  Administered 2020-09-17: 10 mg via INTRAVENOUS
  Administered 2020-09-17: 100 mg via INTRAVENOUS
  Administered 2020-09-17 (×2): 20 mg via INTRAVENOUS

## 2020-09-17 MED ORDER — GLYCOPYRROLATE PF 0.2 MG/ML IJ SOSY
PREFILLED_SYRINGE | INTRAMUSCULAR | Status: DC | PRN
  Administered 2020-09-17 (×2): .1 mg via INTRAVENOUS
  Administered 2020-09-17: .2 mg via INTRAVENOUS

## 2020-09-17 MED ORDER — CELECOXIB 200 MG PO CAPS
400.0000 mg | ORAL_CAPSULE | ORAL | Status: AC
  Administered 2020-09-17: 400 mg via ORAL

## 2020-09-17 MED ORDER — OXYCODONE HCL 5 MG PO TABS
ORAL_TABLET | ORAL | Status: AC
  Filled 2020-09-17: qty 10

## 2020-09-17 MED ORDER — STERILE WATER FOR IRRIGATION IR SOLN
Status: DC | PRN
  Administered 2020-09-17: 1000 mL

## 2020-09-17 MED ORDER — ACETAMINOPHEN 500 MG PO TABS
1000.0000 mg | ORAL_TABLET | Freq: Four times a day (QID) | ORAL | Status: DC
  Administered 2020-09-17 – 2020-09-18 (×4): 1000 mg via ORAL

## 2020-09-17 MED ORDER — SODIUM CHLORIDE 0.9 % IV SOLN
INTRAVENOUS | Status: DC | PRN
  Administered 2020-09-17: 120 mL

## 2020-09-17 MED ORDER — HYDRALAZINE HCL 20 MG/ML IJ SOLN
10.0000 mg | Freq: Once | INTRAMUSCULAR | Status: AC
  Administered 2020-09-17: 10 mg via INTRAVENOUS

## 2020-09-17 MED ORDER — LIDOCAINE 2% (20 MG/ML) 5 ML SYRINGE
INTRAMUSCULAR | Status: DC | PRN
  Administered 2020-09-17: 100 mg via INTRAVENOUS

## 2020-09-17 MED ORDER — FENTANYL CITRATE (PF) 100 MCG/2ML IJ SOLN
25.0000 ug | INTRAMUSCULAR | Status: DC | PRN
  Administered 2020-09-17: 25 ug via INTRAVENOUS

## 2020-09-17 MED ORDER — ROCURONIUM BROMIDE 10 MG/ML (PF) SYRINGE
PREFILLED_SYRINGE | INTRAVENOUS | Status: AC
  Filled 2020-09-17: qty 10

## 2020-09-17 MED ORDER — GLYCOPYRROLATE PF 0.2 MG/ML IJ SOSY
PREFILLED_SYRINGE | INTRAMUSCULAR | Status: AC
  Filled 2020-09-17: qty 1

## 2020-09-17 MED ORDER — OXYCODONE HCL 5 MG PO TABS
5.0000 mg | ORAL_TABLET | ORAL | Status: DC | PRN
  Administered 2020-09-17 – 2020-09-18 (×4): 10 mg via ORAL

## 2020-09-17 MED ORDER — AMLODIPINE BESYLATE 5 MG PO TABS
5.0000 mg | ORAL_TABLET | Freq: Every day | ORAL | Status: DC
  Administered 2020-09-17: 5 mg via ORAL
  Filled 2020-09-17: qty 1

## 2020-09-17 MED ORDER — LIDOCAINE HCL (PF) 2 % IJ SOLN
INTRAMUSCULAR | Status: AC
  Filled 2020-09-17: qty 10

## 2020-09-17 MED ORDER — POVIDONE-IODINE 10 % EX SWAB: 2.0000 "application " | Freq: Once | CUTANEOUS | Status: DC

## 2020-09-17 MED ORDER — SENNA 8.6 MG PO TABS
1.0000 | ORAL_TABLET | Freq: Two times a day (BID) | ORAL | Status: DC
  Administered 2020-09-17 (×2): 8.6 mg via ORAL

## 2020-09-17 MED ORDER — HYDRALAZINE HCL 20 MG/ML IJ SOLN
INTRAMUSCULAR | Status: AC
  Filled 2020-09-17: qty 1

## 2020-09-17 MED ORDER — CELECOXIB 200 MG PO CAPS
ORAL_CAPSULE | ORAL | Status: AC
  Filled 2020-09-17: qty 2

## 2020-09-17 MED ORDER — SIMETHICONE 80 MG PO CHEW: 80.0000 mg | CHEWABLE_TABLET | Freq: Four times a day (QID) | ORAL | Status: DC | PRN

## 2020-09-17 MED ORDER — KETAMINE HCL 50 MG/5ML IJ SOSY
PREFILLED_SYRINGE | INTRAMUSCULAR | Status: AC
  Filled 2020-09-17: qty 5

## 2020-09-17 MED ORDER — HYDROMORPHONE HCL 1 MG/ML IJ SOLN: 0.2000 mg | INTRAMUSCULAR | Status: DC | PRN

## 2020-09-17 MED ORDER — LIDOCAINE HCL (PF) 2 % IJ SOLN
INTRAMUSCULAR | Status: DC | PRN
Start: 2020-09-17 — End: 2020-09-17
  Administered 2020-09-17: 1.5 mg/kg/h via INTRADERMAL

## 2020-09-17 MED ORDER — DEXAMETHASONE SODIUM PHOSPHATE 10 MG/ML IJ SOLN
INTRAMUSCULAR | Status: DC | PRN
  Administered 2020-09-17 (×2): 5 mg via INTRAVENOUS

## 2020-09-17 MED ORDER — SUGAMMADEX SODIUM 500 MG/5ML IV SOLN
INTRAVENOUS | Status: AC
  Filled 2020-09-17: qty 5

## 2020-09-17 MED ORDER — PANTOPRAZOLE SODIUM 40 MG PO TBEC
40.0000 mg | DELAYED_RELEASE_TABLET | Freq: Every day | ORAL | Status: DC
  Administered 2020-09-17: 40 mg via ORAL

## 2020-09-17 MED ORDER — WHITE PETROLATUM EX OINT
TOPICAL_OINTMENT | CUTANEOUS | Status: AC
  Filled 2020-09-17: qty 5

## 2020-09-17 MED ORDER — MIDAZOLAM HCL 2 MG/2ML IJ SOLN
INTRAMUSCULAR | Status: DC | PRN
  Administered 2020-09-17: 2 mg via INTRAVENOUS

## 2020-09-17 MED ORDER — HEMOSTATIC AGENTS (NO CHARGE) OPTIME
TOPICAL | Status: DC | PRN
  Administered 2020-09-17: 1 via TOPICAL

## 2020-09-17 MED ORDER — SUGAMMADEX SODIUM 200 MG/2ML IV SOLN
INTRAVENOUS | Status: DC | PRN
  Administered 2020-09-17: 300 mg via INTRAVENOUS
  Administered 2020-09-17: 200 mg via INTRAVENOUS

## 2020-09-17 MED ORDER — PROPOFOL 500 MG/50ML IV EMUL
INTRAVENOUS | Status: AC
  Filled 2020-09-17: qty 100

## 2020-09-17 MED ORDER — DEXMEDETOMIDINE (PRECEDEX) IN NS 20 MCG/5ML (4 MCG/ML) IV SYRINGE
PREFILLED_SYRINGE | INTRAVENOUS | Status: AC
  Filled 2020-09-17: qty 5

## 2020-09-17 MED ORDER — FENTANYL CITRATE (PF) 100 MCG/2ML IJ SOLN
INTRAMUSCULAR | Status: DC | PRN
  Administered 2020-09-17 (×3): 50 ug via INTRAVENOUS

## 2020-09-17 MED ORDER — LACTATED RINGERS IV SOLN
INTRAVENOUS | Status: DC
  Administered 2020-09-17: 1 mL via INTRAVENOUS

## 2020-09-17 MED ORDER — PROPOFOL 10 MG/ML IV BOLUS
INTRAVENOUS | Status: DC | PRN
  Administered 2020-09-17 (×2): 50 mg via INTRAVENOUS
  Administered 2020-09-17: 150 mg via INTRAVENOUS

## 2020-09-17 MED ORDER — PROPOFOL 1000 MG/100ML IV EMUL
INTRAVENOUS | Status: AC
  Filled 2020-09-17: qty 300

## 2020-09-17 MED ORDER — DEXMEDETOMIDINE (PRECEDEX) IN NS 20 MCG/5ML (4 MCG/ML) IV SYRINGE
PREFILLED_SYRINGE | INTRAVENOUS | Status: DC | PRN
  Administered 2020-09-17: 8 ug via INTRAVENOUS
  Administered 2020-09-17 (×2): 4 ug via INTRAVENOUS

## 2020-09-17 MED ORDER — IBUPROFEN 200 MG PO TABS
600.0000 mg | ORAL_TABLET | Freq: Four times a day (QID) | ORAL | Status: DC
  Administered 2020-09-17 – 2020-09-18 (×4): 600 mg via ORAL

## 2020-09-17 MED ORDER — OXYCODONE HCL 5 MG PO TABS
ORAL_TABLET | ORAL | Status: AC
  Filled 2020-09-17: qty 2

## 2020-09-17 MED ORDER — DEXAMETHASONE SODIUM PHOSPHATE 10 MG/ML IJ SOLN
INTRAMUSCULAR | Status: AC
  Filled 2020-09-17: qty 1

## 2020-09-17 MED ORDER — MIDAZOLAM HCL 2 MG/2ML IJ SOLN
INTRAMUSCULAR | Status: AC
  Filled 2020-09-17: qty 2

## 2020-09-17 MED ORDER — TEMAZEPAM 15 MG PO CAPS: 15.0000 mg | ORAL_CAPSULE | Freq: Every evening | ORAL | Status: DC | PRN

## 2020-09-17 MED ORDER — ALUM & MAG HYDROXIDE-SIMETH 200-200-20 MG/5ML PO SUSP: 30.0000 mL | ORAL | Status: DC | PRN

## 2020-09-17 MED ORDER — APREPITANT 40 MG PO CAPS
40.0000 mg | ORAL_CAPSULE | ORAL | Status: AC
  Administered 2020-09-17: 40 mg via ORAL

## 2020-09-17 MED ORDER — PROPOFOL 500 MG/50ML IV EMUL
INTRAVENOUS | Status: AC
  Filled 2020-09-17: qty 50

## 2020-09-17 MED ORDER — ACETAMINOPHEN 500 MG PO TABS: 1000.0000 mg | ORAL_TABLET | Freq: Once | ORAL | Status: AC

## 2020-09-17 MED ORDER — SCOPOLAMINE 1 MG/3DAYS TD PT72
MEDICATED_PATCH | TRANSDERMAL | Status: AC
  Filled 2020-09-17: qty 1

## 2020-09-17 SURGICAL SUPPLY — 75 items
ADH SKN CLS APL DERMABOND .7 (GAUZE/BANDAGES/DRESSINGS) ×1
APL SRG 38 LTWT LNG FL B (MISCELLANEOUS) ×1
APPLICATOR ARISTA FLEXITIP XL (MISCELLANEOUS) ×1 IMPLANT
BARRIER ADHS 3X4 INTERCEED (GAUZE/BANDAGES/DRESSINGS) IMPLANT
BRR ADH 4X3 ABS CNTRL BYND (GAUZE/BANDAGES/DRESSINGS)
CATH FOLEY 3WAY  5CC 16FR (CATHETERS) ×2
CATH FOLEY 3WAY 5CC 16FR (CATHETERS) ×1 IMPLANT
CELLS DAT CNTRL 66122 CELL SVR (MISCELLANEOUS) ×1 IMPLANT
COVER BACK TABLE 60X90IN (DRAPES) ×2 IMPLANT
COVER TIP SHEARS 8 DVNC (MISCELLANEOUS) ×1 IMPLANT
COVER TIP SHEARS 8MM DA VINCI (MISCELLANEOUS) ×2
DECANTER SPIKE VIAL GLASS SM (MISCELLANEOUS) ×4 IMPLANT
DEFOGGER SCOPE WARMER CLEARIFY (MISCELLANEOUS) ×2 IMPLANT
DERMABOND ADVANCED (GAUZE/BANDAGES/DRESSINGS) ×1
DERMABOND ADVANCED .7 DNX12 (GAUZE/BANDAGES/DRESSINGS) ×1 IMPLANT
DILATOR CANAL MILEX (MISCELLANEOUS) ×2 IMPLANT
DRAPE ARM DVNC X/XI (DISPOSABLE) ×4 IMPLANT
DRAPE COLUMN DVNC XI (DISPOSABLE) ×1 IMPLANT
DRAPE DA VINCI XI ARM (DISPOSABLE) ×8
DRAPE DA VINCI XI COLUMN (DISPOSABLE) ×2
DRAPE UTILITY 15X26 TOWEL STRL (DRAPES) ×2 IMPLANT
DURAPREP 26ML APPLICATOR (WOUND CARE) ×2 IMPLANT
ELECT REM PT RETURN 9FT ADLT (ELECTROSURGICAL) ×2
ELECTRODE REM PT RTRN 9FT ADLT (ELECTROSURGICAL) ×1 IMPLANT
GAUZE 4X4 16PLY ~~LOC~~+RFID DBL (SPONGE) ×4 IMPLANT
GLOVE SURG ENC TEXT LTX SZ6.5 (GLOVE) ×6 IMPLANT
GLOVE SURG UNDER POLY LF SZ6.5 (GLOVE) ×12 IMPLANT
HEMOSTAT ARISTA ABSORB 3G PWDR (HEMOSTASIS) ×1 IMPLANT
HIBICLENS CHG 4% 4OZ BTL (MISCELLANEOUS) ×1 IMPLANT
HOLDER FOLEY CATH W/STRAP (MISCELLANEOUS) IMPLANT
IRRIG SUCT STRYKERFLOW 2 WTIP (MISCELLANEOUS) ×2
IRRIGATION SUCT STRKRFLW 2 WTP (MISCELLANEOUS) ×1 IMPLANT
IV NS 1000ML (IV SOLUTION) ×4
IV NS 1000ML BAXH (IV SOLUTION) IMPLANT
KIT TURNOVER CYSTO (KITS) ×2 IMPLANT
LEGGING LITHOTOMY PAIR STRL (DRAPES) ×2 IMPLANT
OBTURATOR OPTICAL STANDARD 8MM (TROCAR)
OBTURATOR OPTICAL STND 8 DVNC (TROCAR)
OBTURATOR OPTICALSTD 8 DVNC (TROCAR) IMPLANT
OCCLUDER COLPOPNEUMO (BALLOONS) ×3 IMPLANT
PACK ROBOT WH (CUSTOM PROCEDURE TRAY) ×2 IMPLANT
PACK ROBOTIC GOWN (GOWN DISPOSABLE) ×2 IMPLANT
PACK TRENDGUARD 450 HYBRID PRO (MISCELLANEOUS) IMPLANT
PAD OB MATERNITY 4.3X12.25 (PERSONAL CARE ITEMS) ×2 IMPLANT
PAD PREP 24X48 CUFFED NSTRL (MISCELLANEOUS) ×2 IMPLANT
PROTECTOR NERVE ULNAR (MISCELLANEOUS) ×2 IMPLANT
RETRACTOR WND ALEXIS 18 MED (MISCELLANEOUS) IMPLANT
RTRCTR WOUND ALEXIS 18CM MED (MISCELLANEOUS) ×2
RTRCTR WOUND ALEXIS 18CM SML (INSTRUMENTS)
SAVER CELL AAL HAEMONETICS (INSTRUMENTS) IMPLANT
SCISSORS LAP 5X45 EPIX DISP (ENDOMECHANICALS) IMPLANT
SEAL CANN UNIV 5-8 DVNC XI (MISCELLANEOUS) ×4 IMPLANT
SEAL XI 5MM-8MM UNIVERSAL (MISCELLANEOUS) ×8
SEALER VESSEL DA VINCI XI (MISCELLANEOUS)
SEALER VESSEL EXT DVNC XI (MISCELLANEOUS) IMPLANT
SET IRRIG Y TYPE TUR BLADDER L (SET/KITS/TRAYS/PACK) IMPLANT
SET TRI-LUMEN FLTR TB AIRSEAL (TUBING) ×1 IMPLANT
SPONGE T-LAP 4X18 ~~LOC~~+RFID (SPONGE) ×3 IMPLANT
SUT VIC AB 0 CT1 27 (SUTURE) ×4
SUT VIC AB 0 CT1 27XBRD ANBCTR (SUTURE) ×2 IMPLANT
SUT VIC AB 3-0 CT1 27 (SUTURE) ×4
SUT VIC AB 3-0 CT1 TAPERPNT 27 (SUTURE) IMPLANT
SUT VICRYL 0 UR6 27IN ABS (SUTURE) IMPLANT
SUT VICRYL RAPIDE 4/0 PS 2 (SUTURE) ×6 IMPLANT
SUT VLOC 180 0 9IN  GS21 (SUTURE) ×4
SUT VLOC 180 0 9IN GS21 (SUTURE) ×1 IMPLANT
TIP RUMI ORANGE 6.7MMX12CM (TIP) ×1 IMPLANT
TIP UTERINE 5.1X6CM LAV DISP (MISCELLANEOUS) IMPLANT
TIP UTERINE 6.7X10CM GRN DISP (MISCELLANEOUS) IMPLANT
TIP UTERINE 6.7X6CM WHT DISP (MISCELLANEOUS) IMPLANT
TIP UTERINE 6.7X8CM BLUE DISP (MISCELLANEOUS) IMPLANT
TOWEL OR 17X26 10 PK STRL BLUE (TOWEL DISPOSABLE) ×2 IMPLANT
TRENDGUARD 450 HYBRID PRO PACK (MISCELLANEOUS) ×2
TROCAR PORT AIRSEAL 8X120 (TROCAR) ×2 IMPLANT
WATER STERILE IRR 1000ML POUR (IV SOLUTION) ×2 IMPLANT

## 2020-09-17 NOTE — Transfer of Care (Signed)
Immediate Anesthesia Transfer of Care Note  Patient: [name] [name]  Procedure(s) Performed: Procedure(s) (LRB): XI ROBOTIC ASSISTED LAPAROSCOPIC HYSTERECTOMY AND BILATERAL SALPINGECTOMY (Bilateral)  Patient Location: PACU  Anesthesia Type: General  Level of Consciousness: awake, sleepy, and cooperative  Airway & Oxygen Therapy: Patient Spontanous Breathing, #8 nasal airway in place and Patient connected to face mask oxygen  Post-op Assessment: Report given to PACU RN and Post -op Vital signs reviewed and stable  Post vital signs: Reviewed and stable  Complications: No apparent anesthesia complications Last Vitals:  Vitals Value Taken Time  BP 181/78 09/17/20 1205  Temp    Pulse 96 09/17/20 1214  Resp 23 09/17/20 1214  SpO2 99 % 09/17/20 1214  Vitals shown include unvalidated device data.  Last Pain:  Vitals:   09/17/20 0732  TempSrc: Oral         Complications: No notable events documented.

## 2020-09-17 NOTE — Anesthesia Procedure Notes (Signed)
Procedure Name: Intubation Date/Time: 09/17/2020 8:44 AM Performed by: Suan Halter, CRNA Pre-anesthesia Checklist: Patient identified, Emergency Drugs available, Suction available and Patient being monitored Patient Re-evaluated:Patient Re-evaluated prior to induction Oxygen Delivery Method: Circle system utilized Preoxygenation: Pre-oxygenation with 100% oxygen Induction Type: IV induction Ventilation: Mask ventilation without difficulty Laryngoscope Size: Mac, 3 and Glidescope Grade View: Grade III Tube type: Oral Tube size: 7.0 mm Number of attempts: 2 Airway Equipment and Method: Stylet Placement Confirmation: ETT inserted through vocal cords under direct vision, positive ETCO2 and breath sounds checked- equal and bilateral Secured at: 23 cm Tube secured with: Tape Dental Injury: Teeth and Oropharynx as per pre-operative assessment  Difficulty Due To: Difficult Airway- due to large tongue Comments: Attempted DL x 1 with MAC 3 - pt with large tongue,large breasts, oral secretions and grade 3 view.  Pt easy to ventilate, trend guard foam pad removed, intubated with glidescope

## 2020-09-17 NOTE — Anesthesia Postprocedure Evaluation (Signed)
Anesthesia Post Note  Patient: [name] [name]  Procedure(s) Performed: XI ROBOTIC ASSISTED LAPAROSCOPIC HYSTERECTOMY AND BILATERAL SALPINGECTOMY (Bilateral: Abdomen)     Patient location during evaluation: PACU Anesthesia Type: General Level of consciousness: awake and alert Pain management: pain level controlled Vital Signs Assessment: post-procedure vital signs reviewed and stable Respiratory status: spontaneous breathing, nonlabored ventilation, respiratory function stable and patient connected to nasal cannula oxygen Cardiovascular status: blood pressure returned to baseline and stable Postop Assessment: no apparent nausea or vomiting Anesthetic complications: no   No notable events documented.  Last Vitals:  Vitals:   09/17/20 1529 09/17/20 1715  BP: (!) 180/82 (!) 192/79  Pulse: 100 99  Resp: 16 14  Temp:  36.6 C  SpO2: 97% 99%    Last Pain:  Vitals:   09/17/20 1715  TempSrc:   PainSc: 0-No pain                 Kenly Xiao L Rihaan Barrack

## 2020-09-17 NOTE — Op Note (Signed)
09/17/2020  11:41 AM  PATIENT:  [name] [name]  45 y.o. female  PRE-OPERATIVE DIAGNOSIS:  Fibroids Menorrhagia with Regular Cycle Anemia  POST-OPERATIVE DIAGNOSIS:  Fibroids Menorrhagia with Regular Cycle Anemia  PROCEDURE:  Procedure(s): XI ROBOTIC ASSISTED LAPAROSCOPIC HYSTERECTOMY AND BILATERAL SALPINGECTOMY (Bilateral)  SURGEON:  Surgeon(s) and Role:    Christophe Louis, MD - Primary    * Drema Dallas, DO  PHYSICIAN ASSISTANT:   ASSISTANTS: none   ANESTHESIA:   general  EBL:  50 mL   BLOOD ADMINISTERED:none  DRAINS: Urinary Catheter (Foley)   LOCAL MEDICATIONS USED:  OTHER ropivicaine   SPECIMEN:  Source of Specimen:  uterus cervix and bilateral fallopian tubes   DISPOSITION OF SPECIMEN:  PATHOLOGY  COUNTS:  YES  TOURNIQUET:  * No tourniquets in log *  DICTATION: .Note written in EPIC  PLAN OF CARE: Admit for overnight observation  PATIENT DISPOSITION:  PACU - hemodynamically stable.   Delay start of Pharmacological VTE agent (>24hrs) due to surgical blood loss or risk of bleeding: not applicable  Findings: enlarged fibroid uterus.  Normal appearing fallopian tubes. The right ovary contains a 3 cm simple appearing cyst.   Procedure: The patient was taken to the operating room where she was placed under general anesthesia.Time out was performed. Marland Kitchen She was placed in dorsal lithotomy position and prepped and draped in the usual sterile fashion. A weighted speculum was placed into the vagina. A Deaver was placed anteriorly for retraction. The anterior lip of the cervix was grasped with a single-tooth tenaculum. The vaginal mucosa was injected with 2.5 cc of ropivacaine at the 2/4/ 8 and 10 o'clock positions. The uterus was sounded to 8 cm. the cervix was dilated to 6 mm . 0 vicryl suture placed at the 12 and 6:00 positions Of the cervix to facilitate placement of a Ru mi uterine manipulator. The manipulator was placed without difficulty. Weighted speculum and  Deaver were removed .  Attention was turned to the patient's abdomen where a 8 mm trocar was placed 2 cm above the umbilicus. under direct visualization . The pneumoperitoneum was achieved with PCO2 gas. The laparoscope was removed. 60 cc of ropivacaine were injected into the abdominal cavity. The laparoscope was reinserted. An 8 mm trocar was placed in the right upper quadrant 16 centimeters from the umbilicus.later connected to robotic arm #4). An 8MM incision was made in the Right upper quadrant TROCAR WAS PLACED 8 cm from the umbilicus. Later connected to robotic arm #3. An 8 mm incision was made in the left upper quadrant 16 cm from the umbilicus and connected to robot arm #1. Marland Kitchen Attention was turned to the left upper quadrant where a 8 mm midclavicular assistant trocar was placed. ( All incision sites were injected with 10cc of ropivacaine prior to port placement. )  Once all ports had been placed under direct visualization.The laparoscope was removed and the [place] robotic system was thin right-sided docked. The robotic arms were connected to the corresponding trocars as listed above. The laparoscope was then reinserted. The long tip bipolar forceps were placed into port #1. The monopolar scissor placed in the port #4. A vessel sealerwas placed in port #3. All instruments were directed into the pelvis under direct visualization.  Attention was turned to the surgeons console.. The left mesosalpinx and left utero-ovarian ligament was cauterized and transected with the vessel sealer The broad ligament was cauterized and transected with the vessel sealer .The round ligament was cauterized and transected with  the vessel sealer  The anterior leaf of broad ligament was incised along the bladder reflection to the midline.  The right  mesosalpinx and right utero-ovarian ligament was cauterized and transected with the vessel sealer. The right broad ligament was cauterized and transected with the vessel sealer. The  right round ligament was cauterized and transected with the vessel sealer The broad ligament was incised to the midline. The bladder was dissected off the lower uterine segments of the cervix via sharp and blunt dissection.   The uterine arteries were skeleton bilaterally. They were  cauterized and transected with the vessel sealer The KOH ring was identified. The anterior colpotomy was performed followed by the posterior colpotomy. Once the uterus,cervix and bilateral fallopian tubes were completely excised was removed through the vagina. The  bipolar forceps and scissors were removed and log tip forceps were placed in the port #1 and the  needle driver was placed in to port #3.  The vaginal cuff was closed with running suture if 0 v-lock. The pelvis was irrigated. Marland KitchenMarland KitchenMarland KitchenExcellent hemostasis was noted. Arista was placed along the vaginal cuff.  All pelvic pedicles were examined and hemostasis was noted.  All instruments removed from the ports. All ports were removed under direct Visualization. The pneumoperitoneum was released. The skin incisions were closed with 4-0 Vicryl and then covered with Derma bond.     Sponge lap and needle counts weIre correct x. The patient was awakened from anesthesia and taken to the recovery room in stable condition.

## 2020-09-17 NOTE — H&P (Signed)
Date of Initial H&P: 09/11/2020  History reviewed, patient examined, no change in status, stable for surgery.

## 2020-09-18 DIAGNOSIS — D259 Leiomyoma of uterus, unspecified: Secondary | ICD-10-CM | POA: Diagnosis not present

## 2020-09-18 LAB — CBC
HCT: 33.8 % — ABNORMAL LOW (ref 36.0–46.0)
Hemoglobin: 9.5 g/dL — ABNORMAL LOW (ref 12.0–15.0)
MCH: 21.9 pg — ABNORMAL LOW (ref 26.0–34.0)
MCHC: 28.1 g/dL — ABNORMAL LOW (ref 30.0–36.0)
MCV: 78.1 fL — ABNORMAL LOW (ref 80.0–100.0)
Platelets: 454 10*3/uL — ABNORMAL HIGH (ref 150–400)
RBC: 4.33 MIL/uL (ref 3.87–5.11)
RDW: 17.2 % — ABNORMAL HIGH (ref 11.5–15.5)
WBC: 15.2 10*3/uL — ABNORMAL HIGH (ref 4.0–10.5)
nRBC: 0.1 % (ref 0.0–0.2)

## 2020-09-18 LAB — SURGICAL PATHOLOGY

## 2020-09-18 MED ORDER — IBUPROFEN 600 MG PO TABS: 600.0000 mg | ORAL_TABLET | Freq: Four times a day (QID) | ORAL | 1 refills | Status: DC | PRN

## 2020-09-18 MED ORDER — AMLODIPINE BESYLATE 5 MG PO TABS
5.0000 mg | ORAL_TABLET | Freq: Every day | ORAL | 2 refills | Status: DC
Start: 2020-09-18 — End: 2021-11-23

## 2020-09-18 MED ORDER — ACETAMINOPHEN 500 MG PO TABS
ORAL_TABLET | ORAL | Status: AC
  Filled 2020-09-18: qty 2

## 2020-09-18 MED ORDER — ACETAMINOPHEN 500 MG PO TABS: 1000.0000 mg | ORAL_TABLET | Freq: Three times a day (TID) | ORAL | 0 refills | Status: DC | PRN

## 2020-09-18 MED ORDER — ONDANSETRON HCL 4 MG/2ML IJ SOLN
INTRAMUSCULAR | Status: AC
  Filled 2020-09-18: qty 2

## 2020-09-18 MED ORDER — OXYCODONE HCL 5 MG PO TABS
ORAL_TABLET | ORAL | Status: AC
  Filled 2020-09-18: qty 2

## 2020-09-18 MED ORDER — OXYCODONE HCL 5 MG PO TABS: 5.0000 mg | ORAL_TABLET | ORAL | 0 refills | Status: DC | PRN

## 2020-09-18 MED ORDER — IBUPROFEN 200 MG PO TABS
ORAL_TABLET | ORAL | Status: AC
  Filled 2020-09-18: qty 3

## 2020-09-18 NOTE — Discharge Summary (Signed)
Physician Discharge Summary  Patient ID: [name] [name] MRN: [id number] DOB/AGE: 45-04-1975 45 y.o.  Admit date: 09/17/2020 Discharge date: 09/18/2020  Admission Diagnoses:  Discharge Diagnoses:  Active Problems:   Fibroids, intramural   S/P laparoscopic hysterectomy   Discharged Condition: stable  Hospital Course:  pt was admitted for observation after undergoing  a robotic assisted laparoscpic hysterectomy with bilateral salpingectomy. She did well postoperatively with return of bowel and bladder function.   Consults: None  Significant Diagnostic Studies: labs: pod#1 hgb 9.5  Treatments: surgery: robotic assisted laparoscopic hysterectomy with bilateral salpingectomy   Discharge Exam: Blood pressure (!) 153/74, pulse 73, temperature 98.2 F (36.8 C), resp. rate 14, height '5\' 4"'$  (1.626 m), weight 128.4 kg, last menstrual period 09/05/2020, SpO2 98 %. General appearance: alert, cooperative, and no distress Resp: no distress  GI: soft appropriately tender nondistended  Extremities: extremities normal, atraumatic, no cyanosis or edema Incision/Wound:well appproximated no erythema or exudate   Disposition: Discharge disposition: 01-Home or Self Care       Discharge Instructions     Call MD for:  persistant nausea and vomiting   Complete by: As directed    Call MD for:  redness, tenderness, or signs of infection (pain, swelling, redness, odor or green/yellow discharge around incision site)   Complete by: As directed    Call MD for:  severe uncontrolled pain   Complete by: As directed    Call MD for:  temperature >100.4   Complete by: As directed    Diet - low sodium heart healthy   Complete by: As directed    Driving Restrictions   Complete by: As directed    Avoid driving for 1 week   Increase activity slowly   Complete by: As directed    Lifting restrictions   Complete by: As directed    Avoid lifting over 10 lbs   May shower / Bathe   Complete by: As  directed    May walk up steps   Complete by: As directed    No wound care   Complete by: As directed    Sexual Activity Restrictions   Complete by: As directed    Avoid sex until approved by Dr. Landry Mellow      Allergies as of 09/18/2020       Reactions   Feraheme [ferumoxytol] Hives        Medication List     TAKE these medications    acetaminophen 500 MG tablet Commonly known as: TYLENOL Take 2 tablets (1,000 mg total) by mouth every 8 (eight) hours as needed.   amLODipine 5 MG tablet Commonly known as: NORVASC Take 1 tablet (5 mg total) by mouth daily.   ibuprofen 600 MG tablet Commonly known as: ADVIL Take 1 tablet (600 mg total) by mouth every 6 (six) hours as needed.   oxyCODONE 5 MG immediate release tablet Commonly known as: Oxy IR/ROXICODONE Take 1-2 tablets (5-10 mg total) by mouth every 4 (four) hours as needed for moderate pain or severe pain.   trazodone 300 MG tablet Commonly known as: DESYREL Take 300 mg by mouth at bedtime as needed for sleep.   Vitamin D (Ergocalciferol) 1.25 MG (50000 UNIT) Caps capsule Commonly known as: DRISDOL Take 50,000 Units by mouth every 7 (seven) days. friday        Follow-up Information     Christophe Louis, MD. Go in 2 week(s).   Specialty: Obstetrics and Gynecology Contact information: [id number] [place]  [place] [address] [phone]                 Signed: Christophe Louis 09/18/2020, 8:08 AM

## 2020-09-19 ENCOUNTER — Encounter (HOSPITAL_BASED_OUTPATIENT_CLINIC_OR_DEPARTMENT_OTHER): Payer: Self-pay | Admitting: Obstetrics and Gynecology

## 2020-09-22 ENCOUNTER — Telehealth: Payer: Self-pay

## 2020-09-22 ENCOUNTER — Other Ambulatory Visit: Payer: Self-pay

## 2020-09-22 ENCOUNTER — Inpatient Hospital Stay: Payer: BC Managed Care – PPO

## 2020-09-22 ENCOUNTER — Inpatient Hospital Stay (HOSPITAL_BASED_OUTPATIENT_CLINIC_OR_DEPARTMENT_OTHER): Payer: BC Managed Care – PPO | Admitting: Hematology and Oncology

## 2020-09-22 ENCOUNTER — Encounter: Payer: Self-pay | Admitting: Hematology and Oncology

## 2020-09-22 VITALS — BP 151/69 | HR 86 | Resp 16

## 2020-09-22 DIAGNOSIS — D500 Iron deficiency anemia secondary to blood loss (chronic): Secondary | ICD-10-CM

## 2020-09-22 DIAGNOSIS — R030 Elevated blood-pressure reading, without diagnosis of hypertension: Secondary | ICD-10-CM

## 2020-09-22 DIAGNOSIS — N920 Excessive and frequent menstruation with regular cycle: Secondary | ICD-10-CM

## 2020-09-22 DIAGNOSIS — R5383 Other fatigue: Secondary | ICD-10-CM

## 2020-09-22 LAB — CBC WITH DIFFERENTIAL/PLATELET
Abs Immature Granulocytes: 0.03 10*3/uL (ref 0.00–0.07)
Basophils Absolute: 0 10*3/uL (ref 0.0–0.1)
Basophils Relative: 0 %
Eosinophils Absolute: 0.2 10*3/uL (ref 0.0–0.5)
Eosinophils Relative: 3 %
HCT: 34.8 % — ABNORMAL LOW (ref 36.0–46.0)
Hemoglobin: 10.3 g/dL — ABNORMAL LOW (ref 12.0–15.0)
Immature Granulocytes: 0 %
Lymphocytes Relative: 31 %
Lymphs Abs: 2.3 10*3/uL (ref 0.7–4.0)
MCH: 22.4 pg — ABNORMAL LOW (ref 26.0–34.0)
MCHC: 29.6 g/dL — ABNORMAL LOW (ref 30.0–36.0)
MCV: 75.8 fL — ABNORMAL LOW (ref 80.0–100.0)
Monocytes Absolute: 0.5 10*3/uL (ref 0.1–1.0)
Monocytes Relative: 6 %
Neutro Abs: 4.4 10*3/uL (ref 1.7–7.7)
Neutrophils Relative %: 60 %
Platelets: 439 10*3/uL — ABNORMAL HIGH (ref 150–400)
RBC: 4.59 MIL/uL (ref 3.87–5.11)
RDW: 18.1 % — ABNORMAL HIGH (ref 11.5–15.5)
WBC: 7.3 10*3/uL (ref 4.0–10.5)
nRBC: 0 % (ref 0.0–0.2)

## 2020-09-22 LAB — IRON AND TIBC
Iron: 37 ug/dL — ABNORMAL LOW (ref 41–142)
Saturation Ratios: 12 % — ABNORMAL LOW (ref 21–57)
TIBC: 313 ug/dL (ref 236–444)
UIBC: 275 ug/dL (ref 120–384)

## 2020-09-22 LAB — SAMPLE TO BLOOD BANK

## 2020-09-22 LAB — FERRITIN: Ferritin: 150 ng/mL (ref 11–307)

## 2020-09-22 MED ORDER — SODIUM CHLORIDE 0.9 % IV SOLN: INTRAVENOUS | Status: DC

## 2020-09-22 MED ORDER — DIPHENHYDRAMINE HCL 25 MG PO CAPS
25.0000 mg | ORAL_CAPSULE | Freq: Once | ORAL | Status: AC
  Administered 2020-09-22: 25 mg via ORAL
  Filled 2020-09-22: qty 1

## 2020-09-22 MED ORDER — SODIUM CHLORIDE 0.9 % IV SOLN
300.0000 mg | Freq: Once | INTRAVENOUS | Status: AC
  Administered 2020-09-22: 300 mg via INTRAVENOUS
  Filled 2020-09-22: qty 300

## 2020-09-22 MED ORDER — LORATADINE 10 MG PO TABS
10.0000 mg | ORAL_TABLET | Freq: Once | ORAL | Status: AC
  Administered 2020-09-22: 10 mg via ORAL
  Filled 2020-09-22: qty 1

## 2020-09-22 MED ORDER — FAMOTIDINE 20 MG IN NS 100 ML IVPB
20.0000 mg | Freq: Once | INTRAVENOUS | Status: AC
Start: 2020-09-22 — End: 2020-09-22
  Administered 2020-09-22: 20 mg via INTRAVENOUS
  Filled 2020-09-22: qty 100

## 2020-09-22 NOTE — Telephone Encounter (Signed)
-----   Message from [name], MD sent at 09/22/2020  1:23 PM EDT ----- Tell her ferritin is ok I will send LOS for 3 months visit

## 2020-09-22 NOTE — Telephone Encounter (Signed)
Called and given below message. She verbalized understanding. 

## 2020-09-22 NOTE — Patient Instructions (Signed)

## 2020-09-22 NOTE — Assessment & Plan Note (Signed)
She had complete hysterectomy last week, pathology is reviewed and benign She will proceed with IV iron today Once I have results of her iron studies, I will determine the next step If her iron studies are adequate, I plan to see her in 3 months However, if her iron studies are not adequate, she will proceed with 1 more dose of IV iron If she remains anemic in the future despite hysterectomy, I will have to refer her back to GI service for repeat endoscopy evaluation She is in agreement with the plan of care

## 2020-09-22 NOTE — Progress Notes (Signed)
Patient refused to wait 30 minutes after infusion. VSS.

## 2020-09-22 NOTE — Assessment & Plan Note (Signed)
Her blood pressure is mildly elevated but better Observe closely for now

## 2020-09-22 NOTE — Progress Notes (Signed)
[facility] OFFICE PROGRESS NOTE  [name] Morning, DO  ASSESSMENT & PLAN:  Iron deficiency anemia She had complete hysterectomy last week, pathology is reviewed and benign She will proceed with IV iron today Once I have results of her iron studies, I will determine the next step If her iron studies are adequate, I plan to see her in 3 months However, if her iron studies are not adequate, she will proceed with 1 more dose of IV iron If she remains anemic in the future despite hysterectomy, I will have to refer her back to GI service for repeat endoscopy evaluation She is in agreement with the plan of care  Elevated BP without diagnosis of hypertension Her blood pressure is mildly elevated but better Observe closely for now  No orders of the defined types were placed in this encounter.   The total time spent in the appointment was 20 minutes encounter with patients including review of chart and various tests results, discussions about plan of care and coordination of care plan   All questions were answered. The patient knows to call the clinic with any problems, questions or concerns. No barriers to learning was detected.    [name], MD 8/15/202212:15 PM  INTERVAL HISTORY: [name] [name] 45 y.o. female returns for follow-up on chronic iron deficiency anemia secondary to menorrhagia She had successful hysterectomy last week I reviewed the pathology, everything came back benign She was kept overnight due to uncontrolled hypertension after the surgery Her blood pressure is slightly better She have no symptoms She complained of mild fatigue She has no excessive bleeding after surgery  SUMMARY OF HEMATOLOGIC HISTORY:  She was found to have abnormal CBC from recent blood work for evaluation of fatigue. She denies recent chest pain on exertion, pre-syncopal episodes, or palpitations. She does complain of leg cramps, dizziness, shortness of breath on minimal  exertion, and frequent headaches. Recent CBC done last month in May 2015 shows significant anemia with hemoglobin 7.9, MCV of 55 and platelet count of 409. She received one dose of intravenous iron on 07/23/2013. After infusion, she complained of scratchy throat and developed hives. Subsequently, she received premedication with Solu-Medrol and she was able to complete her treatment in July 2015 She received further IV iron in June 2017 for recurrent iron deficiency anemia In February 2019, EGD and colonoscopy excluded GI blood loss On 06/29/2017, she underwent D&C and polypectomy She has received several doses of intravenous iron in 2019 In 2021, she received blood transfusion and intravenous iron due to severe menorrhagia On 09/17/2020, she had complete hysterectomy  I have reviewed the past medical history, past surgical history, social history and family history with the patient and they are unchanged from previous note.  ALLERGIES:  is allergic to feraheme [ferumoxytol].  MEDICATIONS:  Current Outpatient Medications  Medication Sig Dispense Refill   acetaminophen (TYLENOL) 500 MG tablet Take 2 tablets (1,000 mg total) by mouth every 8 (eight) hours as needed. 30 tablet 0   amLODipine (NORVASC) 5 MG tablet Take 1 tablet (5 mg total) by mouth daily. 30 tablet 2   ibuprofen (ADVIL) 600 MG tablet Take 1 tablet (600 mg total) by mouth every 6 (six) hours as needed. 30 tablet 1   oxyCODONE (OXY IR/ROXICODONE) 5 MG immediate release tablet Take 1-2 tablets (5-10 mg total) by mouth every 4 (four) hours as needed for moderate pain or severe pain. 20 tablet 0   trazodone (DESYREL) 300 MG tablet Take 300 mg  by mouth at bedtime as needed for sleep.     Vitamin D, Ergocalciferol, (DRISDOL) 1.25 MG (50000 UNIT) CAPS capsule Take 50,000 Units by mouth every 7 (seven) days. friday     No current facility-administered medications for this visit.     REVIEW OF SYSTEMS:   Constitutional: Denies fevers,  chills or night sweats Eyes: Denies blurriness of vision Ears, nose, mouth, throat, and face: Denies mucositis or sore throat Respiratory: Denies cough, dyspnea or wheezes Cardiovascular: Denies palpitation, chest discomfort or lower extremity swelling Gastrointestinal:  Denies nausea, heartburn or change in bowel habits Skin: Denies abnormal skin rashes Lymphatics: Denies new lymphadenopathy or easy bruising Neurological:Denies numbness, tingling or new weaknesses Behavioral/Psych: Mood is stable, no new changes  All other systems were reviewed with the patient and are negative.  PHYSICAL EXAMINATION: ECOG PERFORMANCE STATUS: 1 - Symptomatic but completely ambulatory  Vitals:   09/22/20 1154  BP: (!) 151/66  Pulse: 85  Resp: 18  Temp: 98.4 F (36.9 C)  SpO2: 100%   Filed Weights   09/22/20 1154  Weight: 279 lb 12.8 oz (126.9 kg)    GENERAL:alert, no distress and comfortable NEURO: alert & oriented x 3 with fluent speech, no focal motor/sensory deficits  LABORATORY DATA:  I have reviewed the data as listed     Component Value Date/Time   NA 139 02/11/2017 1000   K 3.4 (L) 02/11/2017 1000   CL 103 02/28/2012 1607   CO2 26 02/11/2017 1000   GLUCOSE 108 02/11/2017 1000   BUN 12.0 02/11/2017 1000   CREATININE 0.7 02/11/2017 1000   CALCIUM 9.1 02/11/2017 1000   PROT 7.3 02/11/2017 1000   ALBUMIN 3.5 02/11/2017 1000   AST 15 02/11/2017 1000   ALT 15 02/11/2017 1000   ALKPHOS 48 02/11/2017 1000   BILITOT <0.22 02/11/2017 1000    No results found for: SPEP, UPEP  Lab Results  Component Value Date   WBC 7.3 09/22/2020   NEUTROABS 4.4 09/22/2020   HGB 10.3 (L) 09/22/2020   HCT 34.8 (L) 09/22/2020   MCV 75.8 (L) 09/22/2020   PLT 439 (H) 09/22/2020      Chemistry      Component Value Date/Time   NA 139 02/11/2017 1000   K 3.4 (L) 02/11/2017 1000   CL 103 02/28/2012 1607   CO2 26 02/11/2017 1000   BUN 12.0 02/11/2017 1000   CREATININE 0.7 02/11/2017 1000       Component Value Date/Time   CALCIUM 9.1 02/11/2017 1000   ALKPHOS 48 02/11/2017 1000   AST 15 02/11/2017 1000   ALT 15 02/11/2017 1000   BILITOT <0.22 02/11/2017 1000

## 2020-12-23 ENCOUNTER — Inpatient Hospital Stay: Payer: BC Managed Care – PPO | Attending: Hematology and Oncology | Admitting: Hematology and Oncology

## 2020-12-23 ENCOUNTER — Telehealth: Payer: Self-pay | Admitting: *Deleted

## 2020-12-23 ENCOUNTER — Other Ambulatory Visit: Payer: Self-pay | Admitting: Hematology and Oncology

## 2020-12-23 ENCOUNTER — Other Ambulatory Visit: Payer: Self-pay

## 2020-12-23 ENCOUNTER — Inpatient Hospital Stay: Payer: BC Managed Care – PPO

## 2020-12-23 ENCOUNTER — Encounter: Payer: Self-pay | Admitting: Hematology and Oncology

## 2020-12-23 VITALS — BP 148/68 | HR 76 | Temp 98.8°F | Resp 18 | Wt 281.6 lb

## 2020-12-23 DIAGNOSIS — D500 Iron deficiency anemia secondary to blood loss (chronic): Secondary | ICD-10-CM | POA: Insufficient documentation

## 2020-12-23 DIAGNOSIS — Z90710 Acquired absence of both cervix and uterus: Secondary | ICD-10-CM | POA: Insufficient documentation

## 2020-12-23 DIAGNOSIS — R519 Headache, unspecified: Secondary | ICD-10-CM | POA: Insufficient documentation

## 2020-12-23 DIAGNOSIS — R0989 Other specified symptoms and signs involving the circulatory and respiratory systems: Secondary | ICD-10-CM | POA: Insufficient documentation

## 2020-12-23 DIAGNOSIS — R42 Dizziness and giddiness: Secondary | ICD-10-CM | POA: Diagnosis not present

## 2020-12-23 DIAGNOSIS — R0602 Shortness of breath: Secondary | ICD-10-CM | POA: Diagnosis not present

## 2020-12-23 DIAGNOSIS — Z79899 Other long term (current) drug therapy: Secondary | ICD-10-CM | POA: Diagnosis not present

## 2020-12-23 DIAGNOSIS — N920 Excessive and frequent menstruation with regular cycle: Secondary | ICD-10-CM | POA: Diagnosis present

## 2020-12-23 DIAGNOSIS — R252 Cramp and spasm: Secondary | ICD-10-CM | POA: Diagnosis not present

## 2020-12-23 DIAGNOSIS — R5383 Other fatigue: Secondary | ICD-10-CM

## 2020-12-23 LAB — CBC WITH DIFFERENTIAL/PLATELET
Abs Immature Granulocytes: 0.03 10*3/uL (ref 0.00–0.07)
Basophils Absolute: 0.1 10*3/uL (ref 0.0–0.1)
Basophils Relative: 1 %
Eosinophils Absolute: 0.1 10*3/uL (ref 0.0–0.5)
Eosinophils Relative: 1 %
HCT: 37.1 % (ref 36.0–46.0)
Hemoglobin: 11 g/dL — ABNORMAL LOW (ref 12.0–15.0)
Immature Granulocytes: 0 %
Lymphocytes Relative: 28 %
Lymphs Abs: 2.5 10*3/uL (ref 0.7–4.0)
MCH: 22 pg — ABNORMAL LOW (ref 26.0–34.0)
MCHC: 29.6 g/dL — ABNORMAL LOW (ref 30.0–36.0)
MCV: 74.1 fL — ABNORMAL LOW (ref 80.0–100.0)
Monocytes Absolute: 0.6 10*3/uL (ref 0.1–1.0)
Monocytes Relative: 7 %
Neutro Abs: 5.7 10*3/uL (ref 1.7–7.7)
Neutrophils Relative %: 63 %
Platelets: 299 10*3/uL (ref 150–400)
RBC: 5.01 MIL/uL (ref 3.87–5.11)
RDW: 18.6 % — ABNORMAL HIGH (ref 11.5–15.5)
WBC: 8.9 10*3/uL (ref 4.0–10.5)
nRBC: 0 % (ref 0.0–0.2)

## 2020-12-23 LAB — IRON AND TIBC
Iron: 75 ug/dL (ref 41–142)
Saturation Ratios: 27 % (ref 21–57)
TIBC: 278 ug/dL (ref 236–444)
UIBC: 203 ug/dL (ref 120–384)

## 2020-12-23 LAB — SAMPLE TO BLOOD BANK

## 2020-12-23 LAB — FERRITIN: Ferritin: 117 ng/mL (ref 11–307)

## 2020-12-23 NOTE — Progress Notes (Signed)
[facility] OFFICE PROGRESS NOTE  [name] Morning, DO  ASSESSMENT & PLAN:  Iron deficiency anemia Since her hysterectomy, she is no longer iron deficient She is mildly anemic likely due to other causes She does not need further IV iron treatment She has very poor venous access and I recommend we avoid infusion treatment if possible in the future If she becomes anemic again, we might need repeat GI work-up  Orders Placed This Encounter  Procedures   Ferritin    Standing Status:   Standing    Number of Occurrences:   3    Standing Expiration Date:   12/23/2021    The total time spent in the appointment was 15 minutes encounter with patients including review of chart and various tests results, discussions about plan of care and coordination of care plan   All questions were answered. The patient knows to call the clinic with any problems, questions or concerns. No barriers to learning was detected.    [name], MD 11/15/20222:50 PM  INTERVAL HISTORY: [name] [name] 45 y.o. female returns for history of chronic iron deficiency anemia status post hysterectomy She is doing well No further bleeding Her energy level has improved  SUMMARY OF HEMATOLOGIC HISTORY:  She was found to have abnormal CBC from recent blood work for evaluation of fatigue. She denies recent chest pain on exertion, pre-syncopal episodes, or palpitations. She does complain of leg cramps, dizziness, shortness of breath on minimal exertion, and frequent headaches. Recent CBC done last month in May 2015 shows significant anemia with hemoglobin 7.9, MCV of 55 and platelet count of 409. She received one dose of intravenous iron on 07/23/2013. After infusion, she complained of scratchy throat and developed hives. Subsequently, she received premedication with Solu-Medrol and she was able to complete her treatment in July 2015 She received further IV iron in June 2017 for recurrent iron deficiency  anemia In February 2019, EGD and colonoscopy excluded GI blood loss On 06/29/2017, she underwent D&C and polypectomy She has received several doses of intravenous iron in 2019 In 2021, she received blood transfusion and intravenous iron due to severe menorrhagia On 09/17/2020, she had complete hysterectomy I have reviewed the past medical history, past surgical history, social history and family history with the patient and they are unchanged from previous note.  ALLERGIES:  is allergic to feraheme [ferumoxytol].  MEDICATIONS:  Current Outpatient Medications  Medication Sig Dispense Refill   acetaminophen (TYLENOL) 500 MG tablet Take 2 tablets (1,000 mg total) by mouth every 8 (eight) hours as needed. 30 tablet 0   amLODipine (NORVASC) 5 MG tablet Take 1 tablet (5 mg total) by mouth daily. 30 tablet 2   ibuprofen (ADVIL) 600 MG tablet Take 1 tablet (600 mg total) by mouth every 6 (six) hours as needed. 30 tablet 1   oxyCODONE (OXY IR/ROXICODONE) 5 MG immediate release tablet Take 1-2 tablets (5-10 mg total) by mouth every 4 (four) hours as needed for moderate pain or severe pain. 20 tablet 0   trazodone (DESYREL) 300 MG tablet Take 300 mg by mouth at bedtime as needed for sleep.     Vitamin D, Ergocalciferol, (DRISDOL) 1.25 MG (50000 UNIT) CAPS capsule Take 50,000 Units by mouth every 7 (seven) days. friday     No current facility-administered medications for this visit.     REVIEW OF SYSTEMS:   Constitutional: Denies fevers, chills or night sweats Eyes: Denies blurriness of vision Ears, nose, mouth, throat, and face:  Denies mucositis or sore throat Respiratory: Denies cough, dyspnea or wheezes Cardiovascular: Denies palpitation, chest discomfort or lower extremity swelling Gastrointestinal:  Denies nausea, heartburn or change in bowel habits Skin: Denies abnormal skin rashes Lymphatics: Denies new lymphadenopathy or easy bruising Neurological:Denies numbness, tingling or new  weaknesses Behavioral/Psych: Mood is stable, no new changes  All other systems were reviewed with the patient and are negative.  PHYSICAL EXAMINATION: ECOG PERFORMANCE STATUS: 0 - Asymptomatic  Vitals:   12/23/20 1238  BP: (!) 148/68  Pulse: 76  Resp: 18  Temp: 98.8 F (37.1 C)  SpO2: 100%   Filed Weights   12/23/20 1238  Weight: 281 lb 9.6 oz (127.7 kg)    GENERAL:alert, no distress and comfortable NEURO: alert & oriented x 3 with fluent speech, no focal motor/sensory deficits  LABORATORY DATA:  I have reviewed the data as listed     Component Value Date/Time   NA 139 02/11/2017 1000   K 3.4 (L) 02/11/2017 1000   CL 103 02/28/2012 1607   CO2 26 02/11/2017 1000   GLUCOSE 108 02/11/2017 1000   BUN 12.0 02/11/2017 1000   CREATININE 0.7 02/11/2017 1000   CALCIUM 9.1 02/11/2017 1000   PROT 7.3 02/11/2017 1000   ALBUMIN 3.5 02/11/2017 1000   AST 15 02/11/2017 1000   ALT 15 02/11/2017 1000   ALKPHOS 48 02/11/2017 1000   BILITOT <0.22 02/11/2017 1000    No results found for: SPEP, UPEP  Lab Results  Component Value Date   WBC 8.9 12/23/2020   NEUTROABS 5.7 12/23/2020   HGB 11.0 (L) 12/23/2020   HCT 37.1 12/23/2020   MCV 74.1 (L) 12/23/2020   PLT 299 12/23/2020      Chemistry      Component Value Date/Time   NA 139 02/11/2017 1000   K 3.4 (L) 02/11/2017 1000   CL 103 02/28/2012 1607   CO2 26 02/11/2017 1000   BUN 12.0 02/11/2017 1000   CREATININE 0.7 02/11/2017 1000      Component Value Date/Time   CALCIUM 9.1 02/11/2017 1000   ALKPHOS 48 02/11/2017 1000   AST 15 02/11/2017 1000   ALT 15 02/11/2017 1000   BILITOT <0.22 02/11/2017 1000

## 2020-12-23 NOTE — Assessment & Plan Note (Addendum)
Since her hysterectomy, she is no longer iron deficient She is mildly anemic likely due to other causes She does not need further IV iron treatment She has very poor venous access and I recommend we avoid infusion treatment if possible in the future If she becomes anemic again, we might need repeat GI work-up

## 2020-12-23 NOTE — Telephone Encounter (Addendum)
Contacted patient regarding test results as noted in Dr. Alvy Bimler message. Patient verbalized understanding    ----- Message from [name], MD sent at 12/23/2020  1:21 PM EST ----- Her iron tests actually came back normal She does not need to take oral iron, just multivitamin daily

## 2021-06-25 ENCOUNTER — Inpatient Hospital Stay: Payer: BC Managed Care – PPO | Attending: Family Medicine

## 2021-06-25 DIAGNOSIS — D509 Iron deficiency anemia, unspecified: Secondary | ICD-10-CM | POA: Insufficient documentation

## 2021-06-25 DIAGNOSIS — Z79899 Other long term (current) drug therapy: Secondary | ICD-10-CM | POA: Insufficient documentation

## 2021-06-25 DIAGNOSIS — Z90710 Acquired absence of both cervix and uterus: Secondary | ICD-10-CM | POA: Insufficient documentation

## 2021-06-26 ENCOUNTER — Inpatient Hospital Stay: Payer: BC Managed Care – PPO | Admitting: Hematology and Oncology

## 2021-06-26 ENCOUNTER — Telehealth: Payer: Self-pay

## 2021-06-26 NOTE — Telephone Encounter (Signed)
Called regarding missed lab appt yesterday. Canceled today's appt with Dr. Alvy Bimler. She said that she forgot. Rescheduled appts. She is aware of rescheduled appts.

## 2021-06-29 ENCOUNTER — Inpatient Hospital Stay: Payer: BC Managed Care – PPO

## 2021-06-29 ENCOUNTER — Other Ambulatory Visit: Payer: Self-pay

## 2021-06-29 DIAGNOSIS — Z79899 Other long term (current) drug therapy: Secondary | ICD-10-CM | POA: Diagnosis not present

## 2021-06-29 DIAGNOSIS — Z90710 Acquired absence of both cervix and uterus: Secondary | ICD-10-CM | POA: Diagnosis not present

## 2021-06-29 DIAGNOSIS — R5383 Other fatigue: Secondary | ICD-10-CM

## 2021-06-29 DIAGNOSIS — D500 Iron deficiency anemia secondary to blood loss (chronic): Secondary | ICD-10-CM

## 2021-06-29 DIAGNOSIS — D509 Iron deficiency anemia, unspecified: Secondary | ICD-10-CM | POA: Diagnosis present

## 2021-06-29 DIAGNOSIS — N920 Excessive and frequent menstruation with regular cycle: Secondary | ICD-10-CM

## 2021-06-29 LAB — CBC WITH DIFFERENTIAL/PLATELET
Abs Immature Granulocytes: 0.03 10*3/uL (ref 0.00–0.07)
Basophils Absolute: 0.1 10*3/uL (ref 0.0–0.1)
Basophils Relative: 1 %
Eosinophils Absolute: 0.2 10*3/uL (ref 0.0–0.5)
Eosinophils Relative: 2 %
HCT: 38.1 % (ref 36.0–46.0)
Hemoglobin: 11.9 g/dL — ABNORMAL LOW (ref 12.0–15.0)
Immature Granulocytes: 0 %
Lymphocytes Relative: 31 %
Lymphs Abs: 2.8 10*3/uL (ref 0.7–4.0)
MCH: 23.5 pg — ABNORMAL LOW (ref 26.0–34.0)
MCHC: 31.2 g/dL (ref 30.0–36.0)
MCV: 75.1 fL — ABNORMAL LOW (ref 80.0–100.0)
Monocytes Absolute: 0.5 10*3/uL (ref 0.1–1.0)
Monocytes Relative: 6 %
Neutro Abs: 5.4 10*3/uL (ref 1.7–7.7)
Neutrophils Relative %: 60 %
Platelets: 344 10*3/uL (ref 150–400)
RBC: 5.07 MIL/uL (ref 3.87–5.11)
RDW: 16.1 % — ABNORMAL HIGH (ref 11.5–15.5)
WBC: 9 10*3/uL (ref 4.0–10.5)
nRBC: 0 % (ref 0.0–0.2)

## 2021-06-29 LAB — FERRITIN: Ferritin: 93 ng/mL (ref 11–307)

## 2021-07-07 ENCOUNTER — Inpatient Hospital Stay (HOSPITAL_BASED_OUTPATIENT_CLINIC_OR_DEPARTMENT_OTHER): Payer: BC Managed Care – PPO | Admitting: Hematology and Oncology

## 2021-07-07 ENCOUNTER — Encounter: Payer: Self-pay | Admitting: Hematology and Oncology

## 2021-07-07 DIAGNOSIS — D500 Iron deficiency anemia secondary to blood loss (chronic): Secondary | ICD-10-CM | POA: Diagnosis not present

## 2021-07-07 NOTE — Progress Notes (Signed)
HEMATOLOGY-ONCOLOGY ELECTRONIC VISIT PROGRESS NOTE  Patient Care Team: [name], DO as PCP - General (Family Medicine)  I connected with the patient via telephone conference and verified that I am speaking with the correct person using two identifiers. The patient's location is at home and I am providing care from the [facility] I discussed the limitations, risks, security and privacy concerns of performing an evaluation and management service by e-visits and the availability of in person appointments.  I also discussed with the patient that there may be a patient responsible charge related to this service. The patient expressed understanding and agreed to proceed.   ASSESSMENT & PLAN:  Iron deficiency anemia Since her hysterectomy, she is no longer iron deficient She is mildly anemic likely due to other causes She does not need further IV iron treatment She has very poor venous access and I recommend we avoid infusion treatment if possible in the future If she becomes anemic again, we might need repeat GI work-up I recommend repeat blood work again in 6 months for further follow-up She wants to continue virtual visit; we will get her to come in a week prior to her appointment to get her labs drawn  Orders Placed This Encounter  Procedures   Iron and Iron Binding Capacity ([facility] only)    Standing Status:   Future    Standing Expiration Date:   07/08/2022   Ferritin    Standing Status:   Future    Standing Expiration Date:   07/07/2022   CBC with Differential ([facility] Only)    Standing Status:   Future    Standing Expiration Date:   07/08/2022    INTERVAL HISTORY: Please see below for problem oriented charting. The purpose of today's discussion is for the follow-up regarding history of iron deficiency anemia secondary to menorrhagia Since hysterectomy, she is no longer anemic Her last IV iron infusion was in August 2022 The patient denies any recent signs or  symptoms of bleeding such as spontaneous epistaxis, hematuria or hematochezia.  SUMMARY OF HEMATOLOGIC HISTORY:  She was found to have abnormal CBC from recent blood work for evaluation of fatigue. She denies recent chest pain on exertion, pre-syncopal episodes, or palpitations. She does complain of leg cramps, dizziness, shortness of breath on minimal exertion, and frequent headaches. Recent CBC done last month in May 2015 shows significant anemia with hemoglobin 7.9, MCV of 55 and platelet count of 409. She received one dose of intravenous iron on 07/23/2013. After infusion, she complained of scratchy throat and developed hives. Subsequently, she received premedication with Solu-Medrol and she was able to complete her treatment in July 2015 She received further IV iron in June 2017 for recurrent iron deficiency anemia In February 2019, EGD and colonoscopy excluded GI blood loss On 06/29/2017, she underwent D&C and polypectomy She has received several doses of intravenous iron in 2019 In 2021, she received blood transfusion and intravenous iron due to severe menorrhagia On 09/17/2020, she had complete hysterectomy  REVIEW OF SYSTEMS:   Constitutional: Denies fevers, chills or abnormal weight loss Eyes: Denies blurriness of vision Ears, nose, mouth, throat, and face: Denies mucositis or sore throat Respiratory: Denies cough, dyspnea or wheezes Cardiovascular: Denies palpitation, chest discomfort Gastrointestinal:  Denies nausea, heartburn or change in bowel habits Skin: Denies abnormal skin rashes Lymphatics: Denies new lymphadenopathy or easy bruising Neurological:Denies numbness, tingling or new weaknesses Behavioral/Psych: Mood is stable, no new changes  Extremities: No lower extremity edema All other systems  were reviewed with the patient and are negative.  I have reviewed the past medical history, past surgical history, social history and family history with the patient and they are  unchanged from previous note.  ALLERGIES:  is allergic to feraheme [ferumoxytol].  MEDICATIONS:  Current Outpatient Medications  Medication Sig Dispense Refill   acetaminophen (TYLENOL) 500 MG tablet Take 2 tablets (1,000 mg total) by mouth every 8 (eight) hours as needed. 30 tablet 0   amLODipine (NORVASC) 5 MG tablet Take 1 tablet (5 mg total) by mouth daily. 30 tablet 2   ibuprofen (ADVIL) 600 MG tablet Take 1 tablet (600 mg total) by mouth every 6 (six) hours as needed. 30 tablet 1   oxyCODONE (OXY IR/ROXICODONE) 5 MG immediate release tablet Take 1-2 tablets (5-10 mg total) by mouth every 4 (four) hours as needed for moderate pain or severe pain. 20 tablet 0   trazodone (DESYREL) 300 MG tablet Take 300 mg by mouth at bedtime as needed for sleep.     Vitamin D, Ergocalciferol, (DRISDOL) 1.25 MG (50000 UNIT) CAPS capsule Take 50,000 Units by mouth every 7 (seven) days. friday     No current facility-administered medications for this visit.    PHYSICAL EXAMINATION: ECOG PERFORMANCE STATUS: 0 - Asymptomatic  LABORATORY DATA:  I have reviewed the data as listed    Latest Ref Rng & Units 02/11/2017   10:00 AM 01/05/2017    9:20 AM 02/28/2012    4:07 PM  CMP  Glucose 70 - 140 mg/dl 108   90   81    BUN 7.0 - 26.0 mg/dL 12.0   12.8   16    Creatinine 0.6 - 1.1 mg/dL 0.7   0.8   0.60    Sodium 136 - 145 mEq/L 139   139   137    Potassium 3.5 - 5.1 mEq/L 3.4   4.2   4.2    Chloride 96 - 112 mEq/L   103    CO2 22 - 29 mEq/L '26   24   25    '$ Calcium 8.4 - 10.4 mg/dL 9.1   9.6   9.3    Total Protein 6.4 - 8.3 g/dL 7.3   8.0   7.4    Total Bilirubin 0.20 - 1.20 mg/dL <0.22   0.29   0.2    Alkaline Phos 40 - 150 U/L 48   56   57    AST 5 - 34 U/L '15   12   13    '$ ALT 0 - 55 U/L '15   11   11      '$ Lab Results  Component Value Date   WBC 9.0 06/29/2021   HGB 11.9 (L) 06/29/2021   HCT 38.1 06/29/2021   MCV 75.1 (L) 06/29/2021   PLT 344 06/29/2021   NEUTROABS 5.4 06/29/2021    I  discussed the assessment and treatment plan with the patient. The patient was provided an opportunity to ask questions and all were answered. The patient agreed with the plan and demonstrated an understanding of the instructions. The patient was advised to call back or seek an in-person evaluation if the symptoms worsen or if the condition fails to improve as anticipated.    I spent 20 minutes for the appointment reviewing test results, discuss management and coordination of care.  [name], MD 07/07/2021 11:36 AM

## 2021-07-07 NOTE — Assessment & Plan Note (Signed)
Since her hysterectomy, she is no longer iron deficient She is mildly anemic likely due to other causes She does not need further IV iron treatment She has very poor venous access and I recommend we avoid infusion treatment if possible in the future If she becomes anemic again, we might need repeat GI work-up I recommend repeat blood work again in 6 months for further follow-up She wants to continue virtual visit; we will get her to come in a week prior to her appointment to get her labs drawn

## 2021-11-23 ENCOUNTER — Ambulatory Visit (INDEPENDENT_AMBULATORY_CARE_PROVIDER_SITE_OTHER): Payer: BC Managed Care – PPO

## 2021-11-23 ENCOUNTER — Ambulatory Visit: Payer: BC Managed Care – PPO | Admitting: Podiatry

## 2021-11-23 DIAGNOSIS — M79671 Pain in right foot: Secondary | ICD-10-CM | POA: Diagnosis not present

## 2021-11-23 DIAGNOSIS — M7731 Calcaneal spur, right foot: Secondary | ICD-10-CM | POA: Diagnosis not present

## 2021-11-23 DIAGNOSIS — M7732 Calcaneal spur, left foot: Secondary | ICD-10-CM

## 2021-11-23 DIAGNOSIS — M216X1 Other acquired deformities of right foot: Secondary | ICD-10-CM

## 2021-11-23 DIAGNOSIS — M216X2 Other acquired deformities of left foot: Secondary | ICD-10-CM

## 2021-11-23 MED ORDER — MELOXICAM 15 MG PO TABS: 15.0000 mg | ORAL_TABLET | Freq: Every day | ORAL | 0 refills | Status: DC | PRN

## 2021-11-23 NOTE — Patient Instructions (Signed)
Achilles Tendinitis  with Rehab Achilles tendinitis is a disorder of the Achilles tendon. The Achilles tendon connects the large calf muscles (Gastrocnemius and Soleus) to the heel bone (calcaneus). This tendon is sometimes called the heel cord. It is important for pushing-off and standing on your toes and is important for walking, running, or jumping. Tendinitis is often caused by overuse and repetitive microtrauma. SYMPTOMS  Pain, tenderness, swelling, warmth, and redness may occur over the Achilles tendon even at rest.  Pain with pushing off, or flexing or extending the ankle.  Pain that is worsened after or during activity. CAUSES   Overuse sometimes seen with rapid increase in exercise programs or in sports requiring running and jumping.  Poor physical conditioning (strength and flexibility or endurance).  Running sports, especially training running down hills.  Inadequate warm-up before practice or play or failure to stretch before participation.  Injury to the tendon. PREVENTION   Warm up and stretch before practice or competition.  Allow time for adequate rest and recovery between practices and competition.  Keep up conditioning.  Keep up ankle and leg flexibility.  Improve or keep muscle strength and endurance.  Improve cardiovascular fitness.  Use proper technique.  Use proper equipment (shoes, skates).  To help prevent recurrence, taping, protective strapping, or an adhesive bandage may be recommended for several weeks after healing is complete. PROGNOSIS   Recovery may take weeks to several months to heal.  Longer recovery is expected if symptoms have been prolonged.  Recovery is usually quicker if the inflammation is due to a direct blow as compared with overuse or sudden strain. RELATED COMPLICATIONS   Healing time will be prolonged if the condition is not correctly treated. The injury must be given plenty of time to heal.  Symptoms can reoccur if  activity is resumed too soon.  Untreated, tendinitis may increase the risk of tendon rupture requiring additional time for recovery and possibly surgery. TREATMENT   The first treatment consists of rest anti-inflammatory medication, and ice to relieve the pain.  Stretching and strengthening exercises after resolution of pain will likely help reduce the risk of recurrence. Referral to a physical therapist or athletic trainer for further evaluation and treatment may be helpful.  A walking boot or cast may be recommended to rest the Achilles tendon. This can help break the cycle of inflammation and microtrauma.  Arch supports (orthotics) may be prescribed or recommended by your caregiver as an adjunct to therapy and rest.  Surgery to remove the inflamed tendon lining or degenerated tendon tissue is rarely necessary and has shown less than predictable results. MEDICATION   Nonsteroidal anti-inflammatory medications, such as aspirin and ibuprofen, may be used for pain and inflammation relief. Do not take within 7 days before surgery. Take these as directed by your caregiver. Contact your caregiver immediately if any bleeding, stomach upset, or signs of allergic reaction occur. Other minor pain relievers, such as acetaminophen, may also be used.  Pain relievers may be prescribed as necessary by your caregiver. Do not take prescription pain medication for longer than 4 to 7 days. Use only as directed and only as much as you need.  Cortisone injections are rarely indicated. Cortisone injections may weaken tendons and predispose to rupture. It is better to give the condition more time to heal than to use them. HEAT AND COLD  Cold is used to relieve pain and reduce inflammation for acute and chronic Achilles tendinitis. Cold should be applied for 10 to 15 minutes   every 2 to 3 hours for inflammation and pain and immediately after any activity that aggravates your symptoms. Use ice packs or an ice  massage.  Heat may be used before performing stretching and strengthening activities prescribed by your caregiver. Use a heat pack or a warm soak. SEEK MEDICAL CARE IF:  Symptoms get worse or do not improve in 2 weeks despite treatment.  New, unexplained symptoms develop. Drugs used in treatment may produce side effects.  EXERCISES:  RANGE OF MOTION (ROM) AND STRETCHING EXERCISES - Achilles Tendinitis  These exercises may help you when beginning to rehabilitate your injury. Your symptoms may resolve with or without further involvement from your physician, physical therapist or athletic trainer. While completing these exercises, remember:   Restoring tissue flexibility helps normal motion to return to the joints. This allows healthier, less painful movement and activity.  An effective stretch should be held for at least 30 seconds.  A stretch should never be painful. You should only feel a gentle lengthening or release in the stretched tissue.  STRETCH  Gastroc, Standing   Place hands on wall.  Extend right / left leg, keeping the front knee somewhat bent.  Slightly point your toes inward on your back foot.  Keeping your right / left heel on the floor and your knee straight, shift your weight toward the wall, not allowing your back to arch.  You should feel a gentle stretch in the right / left calf. Hold this position for 10 seconds. Repeat 3 times. Complete this stretch 2 times per day.  STRETCH  Soleus, Standing   Place hands on wall.  Extend right / left leg, keeping the other knee somewhat bent.  Slightly point your toes inward on your back foot.  Keep your right / left heel on the floor, bend your back knee, and slightly shift your weight over the back leg so that you feel a gentle stretch deep in your back calf.  Hold this position for 10 seconds. Repeat 3 times. Complete this stretch 2 times per day.  STRETCH  Gastrocsoleus, Standing  Note: This exercise can place  a lot of stress on your foot and ankle. Please complete this exercise only if specifically instructed by your caregiver.   Place the ball of your right / left foot on a step, keeping your other foot firmly on the same step.  Hold on to the wall or a rail for balance.  Slowly lift your other foot, allowing your body weight to press your heel down over the edge of the step.  You should feel a stretch in your right / left calf.  Hold this position for 10 seconds.  Repeat this exercise with a slight bend in your knee. Repeat 3 times. Complete this stretch 2 times per day.   STRENGTHENING EXERCISES - Achilles Tendinitis These exercises may help you when beginning to rehabilitate your injury. They may resolve your symptoms with or without further involvement from your physician, physical therapist or athletic trainer. While completing these exercises, remember:   Muscles can gain both the endurance and the strength needed for everyday activities through controlled exercises.  Complete these exercises as instructed by your physician, physical therapist or athletic trainer. Progress the resistance and repetitions only as guided.  You may experience muscle soreness or fatigue, but the pain or discomfort you are trying to eliminate should never worsen during these exercises. If this pain does worsen, stop and make certain you are following the directions exactly. If   push your toes away from you, pointing them downward. Hold this position for 10 seconds. Return slowly, controlling the tension in the band/tubing. Repeat 3 times. Complete this exercise 2 times per day.   STRENGTH - Plantar-flexors  Stand with your feet shoulder width apart. Steady yourself with a wall or table  using as little support as needed. Keeping your weight evenly spread over the width of your feet, rise up on your toes.* Hold this position for 10 seconds. Repeat 3 times. Complete this exercise 2 times per day.  *If this is too easy, shift your weight toward your right / left leg until you feel challenged. Ultimately, you may be asked to do this exercise with your right / left foot only.  STRENGTH  Plantar-flexors, Eccentric  Note: This exercise can place a lot of stress on your foot and ankle. Please complete this exercise only if specifically instructed by your caregiver.  Place the balls of your feet on a step. With your hands, use only enough support from a wall or rail to keep your balance. Keep your knees straight and rise up on your toes. Slowly shift your weight entirely to your right / left toes and pick up your opposite foot. Gently and with controlled movement, lower your weight through your right / left foot so that your heel drops below the level of the step. You will feel a slight stretch in the back of your calf at the end position. Use the healthy leg to help rise up onto the balls of both feet, then lower weight only on the right / left leg again. Build up to 15 repetitions. Then progress to 3 consecutive sets of 15 repetitions.* After completing the above exercise, complete the same exercise with a slight knee bend (about 30 degrees). Again, build up to 15 repetitions. Then progress to 3 consecutive sets of 15 repetitions.* Perform this exercise 2 times per day.  *When you easily complete 3 sets of 15, your physician, physical therapist or athletic trainer may advise you to add resistance by wearing a backpack filled with additional weight.  STRENGTH - Plantar Flexors, Seated  Sit on a chair that allows your feet to rest flat on the ground. If necessary, sit at the edge of the chair. Keeping your toes firmly on the ground, lift your right / left heel as far as you can without  increasing any discomfort in your ankle. Repeat 3 times. Complete this exercise 2 times a day.   Meloxicam Tablets What is this medication? MELOXICAM (mel OX i cam) treats mild to moderate pain, inflammation, or arthritis. It works by decreasing inflammation. It belongs to a group of medications called NSAIDs. This medicine may be used for other purposes; ask your health care provider or pharmacist if you have questions. COMMON BRAND NAME(S): Mobic What should I tell my care team before I take this medication? They need to know if you have any of these conditions: Asthma (lung or breathing disease) Bleeding disorder Coronary artery bypass graft (CABG) within the past 2 weeks Dehydration Heart attack Heart disease Heart failure High blood pressure If you often drink alcohol Kidney disease Liver disease Smoke tobacco cigarettes Stomach bleeding Stomach ulcers, other stomach or intestine problems Take medications that treat or prevent blood clots Taking other steroids like dexamethasone or prednisone An unusual or allergic reaction to meloxicam, other medications, foods, dyes, or preservatives Pregnant or trying to get pregnant Breast-feeding How should I use this medication? Take this medication by  mouth. Take it as directed on the prescription label at the same time every day. You can take it with or without food. If it upsets your stomach, take it with food. Do not use it more often than directed. There may be unused or extra doses in the bottle after you finish your treatment. Talk to your care team if you have questions about your dose. A special MedGuide will be given to you by the pharmacist with each prescription and refill. Be sure to read this information carefully each time. Talk to your care team about the use of this medication in children. Special care may be needed. Patients over 22 years of age may have a stronger reaction and need a smaller dose. Overdosage: If you  think you have taken too much of this medicine contact a poison control center or emergency room at once. NOTE: This medicine is only for you. Do not share this medicine with others. What if I miss a dose? If you miss a dose, take it as soon as you can. If it is almost time for your next dose, take only that dose. Do not take double or extra doses. What may interact with this medication? Do not take this medication with any of the following: Cidofovir Ketorolac This medication may also interact with the following: Aspirin and aspirin-like medications Certain medications for blood pressure, heart disease, irregular heart beat Certain medications for depression, anxiety, or psychotic disturbances Certain medications that treat or prevent blood clots like warfarin, enoxaparin, dalteparin, apixaban, dabigatran, rivaroxaban Cyclosporine Diuretics Fluconazole Lithium Methotrexate Other NSAIDs, medications for pain and inflammation, like ibuprofen and naproxen Pemetrexed This list may not describe all possible interactions. Give your health care provider a list of all the medicines, herbs, non-prescription drugs, or dietary supplements you use. Also tell them if you smoke, drink alcohol, or use illegal drugs. Some items may interact with your medicine. What should I watch for while using this medication? Visit your care team for regular checks on your progress. Tell your care team if your symptoms do not start to get better or if they get worse. Do not take other medications that contain aspirin, ibuprofen, or naproxen with this medication. Side effects such as stomach upset, nausea, or ulcers may be more likely to occur. Many non-prescription medications contain aspirin, ibuprofen, or naproxen. Always read labels carefully. This medication can cause serious ulcers and bleeding in the stomach. It can happen with no warning. Smoking, drinking alcohol, older age, and poor health can also increase  risks. Call your care team right away if you have stomach pain or blood in your vomit or stool. This medication does not prevent a heart attack or stroke. This medication may increase the chance of a heart attack or stroke. The chance may increase the longer you use this medication or if you have heart disease. If you take aspirin to prevent a heart attack or stroke, talk to your care team about using this medication. Alcohol may interfere with the effect of this medication. Avoid alcoholic drinks. This medication may cause serious skin reactions. They can happen weeks to months after starting the medication. Contact your care team right away if you notice fevers or flu-like symptoms with a rash. The rash may be red or purple and then turn into blisters or peeling of the skin. Or, you might notice a red rash with swelling of the face, lips or lymph nodes in your neck or under your arms. Talk to your care  team if you are pregnant before taking this medication. Taking this medication between weeks 20 and 30 of pregnancy may harm your unborn baby. Your care team will monitor you closely if you need to take it. After 30 weeks of pregnancy, do not take this medication. You may get drowsy or dizzy. Do not drive, use machinery, or do anything that needs mental alertness until you know how this medication affects you. Do not stand up or sit up quickly, especially if you are an older patient. This reduces the risk of dizzy or fainting spells. Be careful brushing or flossing your teeth or using a toothpick because you may get an infection or bleed more easily. If you have any dental work done, tell your dentist you are receiving this medication. This medication may make it more difficult to get pregnant. Talk to your care team if you are concerned about your fertility. What side effects may I notice from receiving this medication? Side effects that you should report to your care team as soon as possible: Allergic  reactions--skin rash, itching, hives, swelling of the face, lips, tongue, or throat Bleeding--bloody or black, tar-like stools, vomiting blood or brown material that looks like coffee grounds, red or dark brown urine, small red or purple spots on skin, unusual bruising or bleeding Heart attack--pain or tightness in the chest, shoulders, arms, or jaw, nausea, shortness of breath, cold or clammy skin, feeling faint or lightheaded Heart failure--shortness of breath, swelling of ankles, feet, or hands, sudden weight gain, unusual weakness or fatigue Increase in blood pressure Kidney injury--decrease in the amount of urine, swelling of the ankles, hands, or feet Liver injury--right upper belly pain, loss of appetite, nausea, light-colored stool, dark yellow or brown urine, yellowing skin or eyes, unusual weakness or fatigue Rash, fever, and swollen lymph nodes Redness, blistering, peeling, or loosening of the skin, including inside the mouth Stroke--sudden numbness or weakness of the face, arm, or leg, trouble speaking, confusion, trouble walking, loss of balance or coordination, dizziness, severe headache, change in vision Side effects that usually do not require medical attention (report to your care team if they continue or are bothersome): Diarrhea Nausea Upset stomach This list may not describe all possible side effects. Call your doctor for medical advice about side effects. You may report side effects to FDA at 1-800-FDA-1088. Where should I keep my medication? Keep out of the reach of children and pets. Store at room temperature between 20 and 25 degrees C (68 and 77 degrees F). Protect from moisture. Keep the container tightly closed. Get rid of any unused medication after the expiration date. To get rid of medications that are no longer needed or have expired: Take the medication to a medication take-back program. Check with your pharmacy or law enforcement to find a location. If you cannot  return the medication, check the label or package insert to see if the medication should be thrown out in the garbage or flushed down the toilet. If you are not sure, ask your care team. If it is safe to put it in the trash, empty the medication out of the container. Mix the medication with cat litter, dirt, coffee grounds, or other unwanted substance. Seal the mixture in a bag or container. Put it in the trash. NOTE: This sheet is a summary. It may not cover all possible information. If you have questions about this medicine, talk to your doctor, pharmacist, or health care provider.  2023 Elsevier/Gold Standard (2007-03-18 00:00:00)

## 2021-11-23 NOTE — Progress Notes (Unsigned)
Subjective:   Patient ID: [name] [name], female   DOB: 46 y.o.   MRN: [id number]   HPI Chief Complaint  Patient presents with   Heel Spurs    Patient came is today for Bilateral heel spurs that started 2 years ago,sharp shooting pain, Rate of pain 8 out of 10 when getting up in the morning and after sitting for a long time, X-Rays taken today    46 year old female presents today with concerns.  She says that originally she thought she sprained her ankle.  She saw orthopedics and was told to do topical medication which did not help.  The right foot is worse than left.  She states that she brings her foot backwards when she gets discomfort (describing a dorsiflexion motion)  Review of Systems  All other systems reviewed and are negative.  Past Medical History:  Diagnosis Date   Anemia    Elevated blood pressure reading without diagnosis of hypertension    runs 160 on top number no meds   Heart murmur    slight murmur no cardiologist   Insomnia 09/09/2020   migraine    PCOS (polycystic ovarian syndrome)    PONV (postoperative nausea and vomiting)    Vitamin D deficiency 09/09/2020   Wears glasses 09/09/2020    Past Surgical History:  Procedure Laterality Date   BUNIONECTOMY  2011   [place]  05/22/2011   right   DILATATION & CURETTAGE/HYSTEROSCOPY WITH MYOSURE N/A 06/29/2017   Procedure: DILATATION & CURETTAGE/HYSTEROSCOPY WITH MYOSURE POLYPECTOMY;  Surgeon: Christophe Louis, MD;  Location: [facility];  Service: Gynecology;  Laterality: N/A;   INTRAUTERINE DEVICE (IUD) INSERTION N/A 06/29/2017   Procedure: INTRAUTERINE DEVICE (IUD) INSERTION;  Surgeon: Christophe Louis, MD;  Location: [facility];  Service: Gynecology;  Laterality: N/A;  Mirena Placement   LAPAROSCOPIC OVARIAN CYSTECTOMY     age 46   LYMPH GLAND EXCISION     age [age]   ROBOTIC ASSISTED LAPAROSCOPIC HYSTERECTOMY AND SALPINGECTOMY Bilateral 09/17/2020   Procedure: XI ROBOTIC ASSISTED LAPAROSCOPIC HYSTERECTOMY AND BILATERAL  SALPINGECTOMY;  Surgeon: Christophe Louis, MD;  Location: [facility];  Service: Gynecology;  Laterality: Bilateral;     Current Outpatient Medications:    meloxicam (MOBIC) 15 MG tablet, Take 1 tablet (15 mg total) by mouth daily as needed for pain., Disp: 30 tablet, Rfl: 0   acetaminophen (TYLENOL) 500 MG tablet, Take 2 tablets (1,000 mg total) by mouth every 8 (eight) hours as needed., Disp: 30 tablet, Rfl: 0   ibuprofen (ADVIL) 600 MG tablet, Take 1 tablet (600 mg total) by mouth every 6 (six) hours as needed., Disp: 30 tablet, Rfl: 1   losartan-hydrochlorothiazide (HYZAAR) 100-12.5 MG tablet, Take 1 tablet by mouth daily., Disp: , Rfl:    SAXENDA 18 MG/3ML SOPN, Inject 3 mg into the skin daily., Disp: , Rfl:    tiZANidine (ZANAFLEX) 4 MG tablet, Take 4 mg by mouth 2 (two) times daily as needed., Disp: , Rfl:    trazodone (DESYREL) 300 MG tablet, Take 300 mg by mouth at bedtime as needed for sleep., Disp: , Rfl:   Allergies  Allergen Reactions   Feraheme [Ferumoxytol] Hives           Objective:  Physical Exam  General: AAO x3, NAD  Dermatological: Skin is warm, dry and supple bilateral.  There are no open sores, no preulcerative lesions, no rash or signs of infection present.  Vascular: Dorsalis Pedis artery and Posterior Tibial artery pedal pulses are 2/4  bilateral with immedate capillary fill time. There is no pain with calf compression, swelling, warmth, erythema.   Neruologic: Grossly intact via light touch bilateral.  Negative Tinel sign.  Musculoskeletal: Tenderness palpation on the posterior aspect of the calcaneus along the area of prominent bone spur.  Discomfort of the distal portion Achilles tendon on the insertion.  No significant pain in the plantar calcaneus.  No edema, erythema.  Muscular strength 5/5 in all groups tested bilateral.  Equinus present.  Gait: Unassisted, Nonantalgic.       Assessment:   Posterior calcaneal spur     Plan:   -Treatment options discussed including all alternatives, risks, and complications -Etiology of symptoms were discussed -X-rays were obtained and reviewed with the patient.  3 views bilateral feet were obtained.  No evidence of acute fracture.  Calcaneal spurring present. -Dispensed a night splint to help Starkes the Achilles tendon to help decrease pressure off the bone spur. -Night splint -Gel pad -Stretching/icing daily Prescribed mobic. Discussed side effects of the medication and directed to stop if any are to occur and call the office.   Trula Slade DPM

## 2021-12-08 ENCOUNTER — Other Ambulatory Visit: Payer: Self-pay | Admitting: Podiatry

## 2021-12-08 DIAGNOSIS — M7732 Calcaneal spur, left foot: Secondary | ICD-10-CM

## 2021-12-28 ENCOUNTER — Other Ambulatory Visit: Payer: BC Managed Care – PPO

## 2022-01-04 ENCOUNTER — Ambulatory Visit: Payer: BC Managed Care – PPO | Admitting: Podiatry

## 2022-01-05 ENCOUNTER — Telehealth: Payer: BC Managed Care – PPO | Admitting: Hematology and Oncology

## 2022-01-12 ENCOUNTER — Other Ambulatory Visit: Payer: BC Managed Care – PPO

## 2022-01-15 ENCOUNTER — Telehealth: Payer: Self-pay

## 2022-01-15 NOTE — Telephone Encounter (Signed)
Called Pt and discussed need for rescheduling appts for lab and Dr. Alvy Bimler d/t to missed lab appt on 12/5. Pt's soonest availability is 02/18/22. Pt verbalized understanding of these appt changes.

## 2022-01-15 NOTE — Telephone Encounter (Signed)
-----   Message from [name], MD sent at 01/15/2022 12:08 PM EST ----- She did not show up for recent labs Unless she gets labs done today or Monday, we need to rescehdule her appt until next year Let me know if you cannot get hold of her, document and I will send letter

## 2022-01-19 ENCOUNTER — Telehealth: Payer: BC Managed Care – PPO | Admitting: Hematology and Oncology

## 2022-01-26 ENCOUNTER — Ambulatory Visit: Payer: BC Managed Care – PPO | Admitting: Podiatry

## 2022-02-18 ENCOUNTER — Telehealth: Payer: Self-pay

## 2022-02-18 ENCOUNTER — Inpatient Hospital Stay: Payer: BC Managed Care – PPO | Attending: Family Medicine

## 2022-02-18 ENCOUNTER — Inpatient Hospital Stay: Payer: BC Managed Care – PPO

## 2022-02-18 ENCOUNTER — Other Ambulatory Visit: Payer: Self-pay

## 2022-02-18 DIAGNOSIS — Z79899 Other long term (current) drug therapy: Secondary | ICD-10-CM | POA: Insufficient documentation

## 2022-02-18 DIAGNOSIS — D509 Iron deficiency anemia, unspecified: Secondary | ICD-10-CM | POA: Insufficient documentation

## 2022-02-18 DIAGNOSIS — D500 Iron deficiency anemia secondary to blood loss (chronic): Secondary | ICD-10-CM

## 2022-02-18 LAB — CBC WITH DIFFERENTIAL (CANCER CENTER ONLY)
Abs Immature Granulocytes: 0.03 10*3/uL (ref 0.00–0.07)
Basophils Absolute: 0 10*3/uL (ref 0.0–0.1)
Basophils Relative: 1 %
Eosinophils Absolute: 0.2 10*3/uL (ref 0.0–0.5)
Eosinophils Relative: 2 %
HCT: 41 % (ref 36.0–46.0)
Hemoglobin: 12.6 g/dL (ref 12.0–15.0)
Immature Granulocytes: 0 %
Lymphocytes Relative: 33 %
Lymphs Abs: 2.8 10*3/uL (ref 0.7–4.0)
MCH: 23.2 pg — ABNORMAL LOW (ref 26.0–34.0)
MCHC: 30.7 g/dL (ref 30.0–36.0)
MCV: 75.6 fL — ABNORMAL LOW (ref 80.0–100.0)
Monocytes Absolute: 0.5 10*3/uL (ref 0.1–1.0)
Monocytes Relative: 6 %
Neutro Abs: 4.9 10*3/uL (ref 1.7–7.7)
Neutrophils Relative %: 58 %
Platelet Count: 338 10*3/uL (ref 150–400)
RBC: 5.42 MIL/uL — ABNORMAL HIGH (ref 3.87–5.11)
RDW: 17.2 % — ABNORMAL HIGH (ref 11.5–15.5)
WBC Count: 8.4 10*3/uL (ref 4.0–10.5)
nRBC: 0 % (ref 0.0–0.2)

## 2022-02-18 LAB — IRON AND IRON BINDING CAPACITY (CC-WL,HP ONLY)
Iron: 70 ug/dL (ref 28–170)
Saturation Ratios: 21 % (ref 10.4–31.8)
TIBC: 336 ug/dL (ref 250–450)
UIBC: 266 ug/dL (ref 148–442)

## 2022-02-18 NOTE — Telephone Encounter (Signed)
Called Pt regarding no show to lab appt. Pt apologized stating she forgot and is agreeable to reschedule for 1500 today. Appt made.

## 2022-02-18 NOTE — Telephone Encounter (Signed)
-----   Message from [name], MD sent at 02/18/2022 10:16 AM EST ----- She did not show up for labs today Can you try to call her? We need to cancel her appt tomorrow if she cannot get it done today

## 2022-02-19 ENCOUNTER — Encounter: Payer: Self-pay | Admitting: Hematology and Oncology

## 2022-02-19 ENCOUNTER — Inpatient Hospital Stay (HOSPITAL_BASED_OUTPATIENT_CLINIC_OR_DEPARTMENT_OTHER): Payer: BC Managed Care – PPO | Admitting: Hematology and Oncology

## 2022-02-19 DIAGNOSIS — D500 Iron deficiency anemia secondary to blood loss (chronic): Secondary | ICD-10-CM

## 2022-02-19 LAB — FERRITIN: Ferritin: 92 ng/mL (ref 11–307)

## 2022-02-19 NOTE — Progress Notes (Signed)
HEMATOLOGY-ONCOLOGY ELECTRONIC VISIT PROGRESS NOTE  Patient Care Team: [name], DO as PCP - General (Family Medicine)  I connected with the patient via telephone conference and verified that I am speaking with the correct person using two identifiers. The patient's location is at home and I am providing care from the [facility] I discussed the limitations, risks, security and privacy concerns of performing an evaluation and management service by e-visits and the availability of in person appointments.  I also discussed with the patient that there may be a patient responsible charge related to this service. The patient expressed understanding and agreed to proceed.   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   INTERVAL HISTORY: Please see below for problem oriented charting. The purpose of today's discussion is to review test results, due to history of severe iron deficiency anemia She felt great She is not symptomatic  SUMMARY OF HEMATOLOGIC HISTORY:  She was found to have abnormal CBC from recent blood work for evaluation of fatigue. She denies recent chest pain on exertion, pre-syncopal episodes, or palpitations. She does complain of leg cramps, dizziness, shortness of breath on minimal exertion, and frequent headaches. Recent CBC done last month in May 2015 shows significant anemia with hemoglobin 7.9, MCV of 55 and platelet count of 409. She received one dose of intravenous iron on  07/23/2013. After infusion, she complained of scratchy throat and developed hives. Subsequently, she received premedication with Solu-Medrol and she was able to complete her treatment in July 2015 She received further IV iron in June 2017 for recurrent iron deficiency anemia In February 2019, EGD and colonoscopy excluded GI blood loss On 06/29/2017, she underwent D&C and polypectomy She has received several doses of intravenous iron in 2019 In 2021, she received blood transfusion and intravenous iron due to severe menorrhagia On 09/17/2020, she had complete hysterectomy.  Her last dose of intravenous iron is in 2022  REVIEW OF SYSTEMS:   Constitutional: Denies fevers, chills or abnormal weight loss Eyes: Denies blurriness of vision Ears, nose, mouth, throat, and face: Denies mucositis or sore throat Respiratory: Denies cough, dyspnea or wheezes Cardiovascular: Denies palpitation, chest discomfort Gastrointestinal:  Denies nausea, heartburn or change in bowel habits Skin: Denies abnormal skin rashes Lymphatics: Denies new lymphadenopathy or easy bruising Neurological:Denies numbness, tingling or new weaknesses Behavioral/Psych: Mood is stable, no new changes  Extremities: No lower extremity edema All other systems were reviewed with the patient and are negative.  I have reviewed the past medical history, past surgical history, social history and family history with the patient and they are unchanged from previous note.  ALLERGIES:  is allergic to feraheme [ferumoxytol].  MEDICATIONS:  Current Outpatient Medications  Medication Sig Dispense Refill   acetaminophen (TYLENOL) 500 MG tablet Take 2 tablets (1,000 mg total) by mouth every 8 (eight) hours as needed. 30 tablet 0   ibuprofen (ADVIL) 600 MG tablet Take 1 tablet (600 mg total) by mouth every 6 (six) hours as needed.  30 tablet 1   losartan-hydrochlorothiazide (HYZAAR) 100-12.5 MG tablet Take 1 tablet by mouth daily.     meloxicam  (MOBIC) 15 MG tablet Take 1 tablet (15 mg total) by mouth daily as needed for pain. 30 tablet 0   SAXENDA 18 MG/3ML SOPN Inject 3 mg into the skin daily.     tiZANidine (ZANAFLEX) 4 MG tablet Take 4 mg by mouth 2 (two) times daily as needed.     trazodone (DESYREL) 300 MG tablet Take 300 mg by mouth at bedtime as needed for sleep.     No current facility-administered medications for this visit.    PHYSICAL EXAMINATION: ECOG PERFORMANCE STATUS: 0 - Asymptomatic  LABORATORY DATA:  I have reviewed the data as listed    Latest Ref Rng & Units 02/11/2017   10:00 AM 01/05/2017    9:20 AM 02/28/2012    4:07 PM  CMP  Glucose 70 - 140 mg/dl 108  90  81   BUN 7.0 - 26.0 mg/dL 12.0  12.8  16   Creatinine 0.6 - 1.1 mg/dL 0.7  0.8  0.60   Sodium 136 - 145 mEq/L 139  139  137   Potassium 3.5 - 5.1 mEq/L 3.4  4.2  4.2   Chloride 96 - 112 mEq/L   103   CO2 22 - 29 mEq/L '26  24  25   '$ Calcium 8.4 - 10.4 mg/dL 9.1  9.6  9.3   Total Protein 6.4 - 8.3 g/dL 7.3  8.0  7.4   Total Bilirubin 0.20 - 1.20 mg/dL <0.22  0.29  0.2   Alkaline Phos 40 - 150 U/L 48  56  57   AST 5 - 34 U/L '15  12  13   '$ ALT 0 - 55 U/L '15  11  11     '$ Lab Results  Component Value Date   WBC 8.4 02/18/2022   HGB 12.6 02/18/2022   HCT 41.0 02/18/2022   MCV 75.6 (L) 02/18/2022   PLT 338 02/18/2022   NEUTROABS 4.9 02/18/2022    I discussed the assessment and treatment plan with the patient. The patient was provided an opportunity to ask questions and all were answered. The patient agreed with the plan and demonstrated an understanding of the instructions. The patient was advised to call back or seek an in-person evaluation if the symptoms worsen or if the condition fails to improve as anticipated.    I spent 20 minutes for the appointment reviewing test results, discuss management and coordination of care.  [name], MD 02/19/2022 9:44 AM

## 2022-02-19 NOTE — Assessment & Plan Note (Signed)
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

## 2022-03-05 ENCOUNTER — Ambulatory Visit: Payer: BC Managed Care – PPO | Admitting: Podiatry

## 2022-05-17 ENCOUNTER — Other Ambulatory Visit (HOSPITAL_BASED_OUTPATIENT_CLINIC_OR_DEPARTMENT_OTHER): Payer: Self-pay | Admitting: Family Medicine

## 2022-05-17 DIAGNOSIS — E7800 Pure hypercholesterolemia, unspecified: Secondary | ICD-10-CM

## 2022-10-22 ENCOUNTER — Ambulatory Visit
Admission: EM | Admit: 2022-10-22 | Discharge: 2022-10-22 | Disposition: A | Discharge status: Home / Self Care | Payer: BC Managed Care – PPO | Attending: Internal Medicine | Admitting: Internal Medicine

## 2022-10-22 DIAGNOSIS — R1010 Upper abdominal pain, unspecified: Secondary | ICD-10-CM | POA: Diagnosis present

## 2022-10-22 DIAGNOSIS — B349 Viral infection, unspecified: Secondary | ICD-10-CM | POA: Insufficient documentation

## 2022-10-22 DIAGNOSIS — Z1152 Encounter for screening for COVID-19: Secondary | ICD-10-CM | POA: Insufficient documentation

## 2022-10-22 MED ORDER — CETIRIZINE HCL 10 MG PO TABS
10.0000 mg | ORAL_TABLET | Freq: Every day | ORAL | 0 refills | Status: AC
Start: 2022-10-22 — End: ?

## 2022-10-22 MED ORDER — ONDANSETRON 8 MG PO TBDP
8.0000 mg | ORAL_TABLET | Freq: Three times a day (TID) | ORAL | 0 refills | Status: AC | PRN
Start: 2022-10-22 — End: ?

## 2022-10-22 MED ORDER — IPRATROPIUM BROMIDE 0.03 % NA SOLN: 2.0000 | Freq: Two times a day (BID) | NASAL | 0 refills | Status: AC

## 2022-10-22 MED ORDER — ONDANSETRON HCL 4 MG/2ML IJ SOLN
4.0000 mg | Freq: Once | INTRAMUSCULAR | Status: AC
  Administered 2022-10-22: 4 mg via INTRAMUSCULAR

## 2022-10-22 MED ORDER — ACETAMINOPHEN 325 MG PO TABS: 650.0000 mg | ORAL_TABLET | Freq: Four times a day (QID) | ORAL | 0 refills | Status: AC | PRN

## 2022-10-22 NOTE — ED Provider Notes (Signed)
[facility]  Note:  This document was prepared using Conservation officer, historic buildings and may include unintentional dictation errors.  MRN: [id number] DOB: [birth year]  Subjective:   [name] [name] is a 46 y.o. female presenting for 1 day history of upper abdominal/epigastric pain, nausea, vomiting, photophobia, sinus pressure, bilateral ear fullness and pain.  Took a COVID test at home and was negative.  Her daughter is also presenting for sick symptoms but no GI symptoms.  No history of asthma.  Deferred imaging given clear cardiopulmonary exam, hemodynamically stable vital signs.  No fever, bloody stools, recent antibiotic use, hospitalizations or long distance travel.  Has not eaten raw foods, drank unfiltered water.  No history of GI disorders including Crohn's, IBS, ulcerative colitis.  Patient has not been able to take her blood pressure medication yesterday or today due to her GI symptoms.  No current facility-administered medications for this encounter.  Current Outpatient Medications:    acetaminophen (TYLENOL) 500 MG tablet, Take 2 tablets (1,000 mg total) by mouth every 8 (eight) hours as needed., Disp: 30 tablet, Rfl: 0   ibuprofen (ADVIL) 600 MG tablet, Take 1 tablet (600 mg total) by mouth every 6 (six) hours as needed., Disp: 30 tablet, Rfl: 1   losartan-hydrochlorothiazide (HYZAAR) 100-12.5 MG tablet, Take 1 tablet by mouth daily., Disp: , Rfl:    meloxicam (MOBIC) 15 MG tablet, Take 1 tablet (15 mg total) by mouth daily as needed for pain., Disp: 30 tablet, Rfl: 0   SAXENDA 18 MG/3ML SOPN, Inject 3 mg into the skin daily., Disp: , Rfl:    tiZANidine (ZANAFLEX) 4 MG tablet, Take 4 mg by mouth 2 (two) times daily as needed., Disp: , Rfl:    trazodone (DESYREL) 300 MG tablet, Take 300 mg by mouth at bedtime as needed for sleep., Disp: , Rfl:    Allergies  Allergen Reactions   Feraheme [Ferumoxytol] Hives    Past Medical History:  Diagnosis Date    Anemia    Elevated blood pressure reading without diagnosis of hypertension    runs 160 on top number no meds   Heart murmur    slight murmur no cardiologist   Insomnia 09/09/2020   migraine    PCOS (polycystic ovarian syndrome)    PONV (postoperative nausea and vomiting)    Vitamin D deficiency 09/09/2020   Wears glasses 09/09/2020     Past Surgical History:  Procedure Laterality Date   BUNIONECTOMY  2011   BUNIONECTOMY  05/22/2011   right   DILATATION & CURETTAGE/HYSTEROSCOPY WITH MYOSURE N/A 06/29/2017   Procedure: DILATATION & CURETTAGE/HYSTEROSCOPY WITH MYOSURE POLYPECTOMY;  Surgeon: Gerald Leitz, MD;  Location: [facility];  Service: Gynecology;  Laterality: N/A;   INTRAUTERINE DEVICE (IUD) INSERTION N/A 06/29/2017   Procedure: INTRAUTERINE DEVICE (IUD) INSERTION;  Surgeon: Gerald Leitz, MD;  Location: [facility];  Service: Gynecology;  Laterality: N/A;  Mirena Placement   LAPAROSCOPIC OVARIAN CYSTECTOMY     age 38   LYMPH GLAND EXCISION     age 28'2   ROBOTIC ASSISTED LAPAROSCOPIC HYSTERECTOMY AND SALPINGECTOMY Bilateral 09/17/2020   Procedure: XI ROBOTIC ASSISTED LAPAROSCOPIC HYSTERECTOMY AND BILATERAL SALPINGECTOMY;  Surgeon: Gerald Leitz, MD;  Location: [facility];  Service: Gynecology;  Laterality: Bilateral;    Family History  Problem Relation Age of Onset   Hypertension Mother     Social History   Tobacco Use   Smoking status: Never   Smokeless tobacco: Never  Vaping Use  Vaping status: Never Used  Substance Use Topics   Alcohol use: Not Currently   Drug use: No    ROS   Objective:   Vitals: BP (!) 183/74 (BP Location: Left Arm) Comment: states unable to keep BP meds down today  Pulse 72   Temp 98.2 F (36.8 C) (Oral)   Resp 20   LMP 09/05/2020 (Approximate)   SpO2 99%   Physical Exam Constitutional:      General: She is not in acute distress.    Appearance: Normal appearance. She is well-developed and normal weight. She is not  ill-appearing, toxic-appearing or diaphoretic.  HENT:     Head: Normocephalic and atraumatic.     Right Ear: Tympanic membrane, ear canal and external ear normal. No drainage or tenderness. No middle ear effusion. There is no impacted cerumen. Tympanic membrane is not erythematous or bulging.     Left Ear: Tympanic membrane, ear canal and external ear normal. No drainage or tenderness.  No middle ear effusion. There is no impacted cerumen. Tympanic membrane is not erythematous or bulging.     Nose: Nose normal. No congestion or rhinorrhea.     Mouth/Throat:     Mouth: Mucous membranes are moist. No oral lesions.     Pharynx: Oropharynx is clear. No pharyngeal swelling, oropharyngeal exudate, posterior oropharyngeal erythema or uvula swelling.     Tonsils: No tonsillar exudate or tonsillar abscesses.  Eyes:     General: No scleral icterus.       Right eye: No discharge.        Left eye: No discharge.     Extraocular Movements: Extraocular movements intact.     Right eye: Normal extraocular motion.     Left eye: Normal extraocular motion.     Conjunctiva/sclera: Conjunctivae normal.  Cardiovascular:     Rate and Rhythm: Normal rate and regular rhythm.     Heart sounds: Normal heart sounds. No murmur heard.    No friction rub. No gallop.  Pulmonary:     Effort: Pulmonary effort is normal. No respiratory distress.     Breath sounds: No stridor. No wheezing, rhonchi or rales.  Chest:     Chest wall: No tenderness.  Abdominal:     General: Bowel sounds are normal. There is no distension.     Palpations: Abdomen is soft. There is no mass.     Tenderness: There is generalized abdominal tenderness (mild) and tenderness in the epigastric area. There is no right CVA tenderness, left CVA tenderness, guarding or rebound.  Musculoskeletal:     Cervical back: Normal range of motion and neck supple.  Lymphadenopathy:     Cervical: No cervical adenopathy.  Skin:    General: Skin is warm and dry.   Neurological:     General: No focal deficit present.     Mental Status: She is alert and oriented to person, place, and time.  Psychiatric:        Mood and Affect: Mood normal.        Behavior: Behavior normal.        Thought Content: Thought content normal.        Judgment: Judgment normal.     IM Zofran 4 mg administered in clinic.  Assessment and Plan :   PDMP not reviewed this encounter.  1. Acute viral syndrome   2. Upper abdominal pain    Low suspicion for an acute abdomen and as such we will defer ER visit.  Will manage for  viral illness such as viral URI, viral syndrome, viral rhinitis, COVID-19, acute gastroenteritis. Recommended supportive care. Offered scripts for symptomatic relief. Testing is pending. Counseled patient on potential for adverse effects with medications prescribed/recommended today, ER and return-to-clinic precautions discussed, patient verbalized understanding.     Wallis Bamberg, [place] 10/22/22 [id number]

## 2022-10-22 NOTE — Discharge Instructions (Signed)
We will notify you of your test results as they arrive and may take between about 24 hours.  I encourage you to sign up for MyChart if you have not already done so as this can be the easiest way for [place] to communicate results to you online or through a phone app.  Generally, we only contact you if it is a positive test result.  In the meantime, if you develop worsening symptoms including fever, chest pain, shortness of breath despite our current treatment plan then please report to the emergency room as this may be a sign of worsening status from possible viral infection.  Otherwise, we will manage this as a viral syndrome. For sore throat or cough try using a honey-based tea. Use 3 teaspoons of honey with juice squeezed from half lemon. Place shaved pieces of ginger into 1/2-1 cup of water and warm over stove top. Then mix the ingredients and repeat every 4 hours as needed. Please take Tylenol 650mg  every 6 hours for aches and pains, fevers. Hydrate very well with at least 2 liters of water. Eat light meals such as soups to replenish electrolytes and soft fruits, veggies. Start an antihistamine like Zyrtec (10mg  daily) for postnasal drainage, sinus congestion.  You can take this together with Atrovent nasal spray.  Use the cough medications as needed. Ondansetron can be used for the nausea and vomiting.

## 2022-10-22 NOTE — ED Triage Notes (Signed)
Pt c/o epigastric pain, n/v and  light sensitivity started yesterday-c/o bilat ear pain started today-reports neg covid home test-NAD-steady gait

## 2022-10-23 ENCOUNTER — Other Ambulatory Visit (HOSPITAL_BASED_OUTPATIENT_CLINIC_OR_DEPARTMENT_OTHER): Payer: Self-pay

## 2022-10-23 ENCOUNTER — Encounter (HOSPITAL_BASED_OUTPATIENT_CLINIC_OR_DEPARTMENT_OTHER): Payer: Self-pay

## 2022-10-23 ENCOUNTER — Emergency Department (HOSPITAL_BASED_OUTPATIENT_CLINIC_OR_DEPARTMENT_OTHER): Payer: BC Managed Care – PPO

## 2022-10-23 ENCOUNTER — Emergency Department (HOSPITAL_BASED_OUTPATIENT_CLINIC_OR_DEPARTMENT_OTHER)
Admission: EM | Admit: 2022-10-23 | Discharge: 2022-10-23 | Disposition: A | Discharge status: Home / Self Care | Payer: BC Managed Care – PPO | Attending: Emergency Medicine | Admitting: Emergency Medicine

## 2022-10-23 DIAGNOSIS — R112 Nausea with vomiting, unspecified: Secondary | ICD-10-CM

## 2022-10-23 DIAGNOSIS — R109 Unspecified abdominal pain: Secondary | ICD-10-CM | POA: Diagnosis present

## 2022-10-23 DIAGNOSIS — K2900 Acute gastritis without bleeding: Secondary | ICD-10-CM | POA: Diagnosis not present

## 2022-10-23 LAB — CBC
HCT: 39.2 % (ref 36.0–46.0)
Hemoglobin: 12 g/dL (ref 12.0–15.0)
MCH: 23.2 pg — ABNORMAL LOW (ref 26.0–34.0)
MCHC: 30.6 g/dL (ref 30.0–36.0)
MCV: 75.8 fL — ABNORMAL LOW (ref 80.0–100.0)
Platelets: 279 10*3/uL (ref 150–400)
RBC: 5.17 MIL/uL — ABNORMAL HIGH (ref 3.87–5.11)
RDW: 15.9 % — ABNORMAL HIGH (ref 11.5–15.5)
WBC: 9.2 10*3/uL (ref 4.0–10.5)
nRBC: 0 % (ref 0.0–0.2)

## 2022-10-23 LAB — COMPREHENSIVE METABOLIC PANEL
ALT: 15 U/L (ref 0–44)
AST: 14 U/L — ABNORMAL LOW (ref 15–41)
Albumin: 4.1 g/dL (ref 3.5–5.0)
Alkaline Phosphatase: 45 U/L (ref 38–126)
Anion gap: 11 (ref 5–15)
BUN: 9 mg/dL (ref 6–20)
CO2: 27 mmol/L (ref 22–32)
Calcium: 9 mg/dL (ref 8.9–10.3)
Chloride: 101 mmol/L (ref 98–111)
Creatinine, Ser: 0.64 mg/dL (ref 0.44–1.00)
GFR, Estimated: >60 mL/min (ref >60)
Glucose, Bld: 94 mg/dL (ref 70–99)
Potassium: 4 mmol/L (ref 3.5–5.1)
Sodium: 139 mmol/L (ref 135–145)
Total Bilirubin: 0.3 mg/dL (ref 0.3–1.2)
Total Protein: 7.8 g/dL (ref 6.5–8.1)

## 2022-10-23 LAB — LIPASE, BLOOD: Lipase: 26 U/L (ref 11–51)

## 2022-10-23 LAB — TROPONIN I (HIGH SENSITIVITY): Troponin I (High Sensitivity): 5 ng/L (ref <18)

## 2022-10-23 LAB — SARS CORONAVIRUS 2 (TAT 6-24 HRS): SARS Coronavirus 2: NEGATIVE

## 2022-10-23 MED ORDER — OMEPRAZOLE 20 MG PO CPDR: 20.0000 mg | DELAYED_RELEASE_CAPSULE | Freq: Every day | ORAL | 0 refills | Status: DC

## 2022-10-23 MED ORDER — SODIUM CHLORIDE 0.9 % IV BOLUS
1000.0000 mL | Freq: Once | INTRAVENOUS | Status: AC
  Administered 2022-10-23: 1000 mL via INTRAVENOUS

## 2022-10-23 MED ORDER — PROCHLORPERAZINE MALEATE 5 MG PO TABS: 5.0000 mg | ORAL_TABLET | Freq: Three times a day (TID) | ORAL | 0 refills | Status: DC | PRN

## 2022-10-23 MED ORDER — OMEPRAZOLE 20 MG PO CPDR
20.0000 mg | DELAYED_RELEASE_CAPSULE | Freq: Every day | ORAL | 0 refills | Status: AC
  Filled 2022-10-23: qty 14, 14d supply, fill #0

## 2022-10-23 MED ORDER — IOHEXOL 300 MG/ML  SOLN
100.0000 mL | Freq: Once | INTRAMUSCULAR | Status: AC | PRN
  Administered 2022-10-23: 100 mL via INTRAVENOUS

## 2022-10-23 MED ORDER — PROCHLORPERAZINE EDISYLATE 10 MG/2ML IJ SOLN
5.0000 mg | Freq: Once | INTRAMUSCULAR | Status: AC
  Administered 2022-10-23: 5 mg via INTRAVENOUS
  Filled 2022-10-23: qty 2

## 2022-10-23 MED ORDER — PROCHLORPERAZINE MALEATE 5 MG PO TABS
5.0000 mg | ORAL_TABLET | Freq: Three times a day (TID) | ORAL | 0 refills | Status: AC | PRN
  Filled 2022-10-23: qty 8, 3d supply, fill #0

## 2022-10-23 NOTE — ED Triage Notes (Signed)
She c/o epigastric discomfort plus occasional n/v x 2 days. She is ambulatory and in no distress. Seen at Urgent Care yesterday and rx with Zofran, which "isn't working".

## 2022-10-23 NOTE — ED Notes (Signed)
Dc instructions reviewed with patient. Patient voiced understanding. Dc with belongings.  °

## 2022-10-23 NOTE — ED Provider Notes (Signed)
[place] EMERGENCY DEPARTMENT AT [facility] Provider Note   CSN: [id number] Arrival date & time: 10/23/22  1115     History  Chief Complaint  Patient presents with   Abdominal Pain    [name] [name] is a 47 y.o. female.   Abdominal Pain Patient presents upper abdominal pain.  Has had since Thursday with today being Saturday.  Has had nausea and vomiting.  Negative COVID test at home.  Saw urgent care with continued symptoms despite Zofran.  No diarrhea but states that has not had a bowel movement in couple days which is not unusual for her.  Does have a history of hypertension but is vomit up her medicines.  Has had previous abdominal surgeries including hysterectomy.  Still has gallbladder. Pain not worse with eating.    Past Medical History:  Diagnosis Date   Anemia    Elevated blood pressure reading without diagnosis of hypertension    runs 160 on top number no meds   Heart murmur    slight murmur no cardiologist   Insomnia 09/09/2020   migraine    PCOS (polycystic ovarian syndrome)    PONV (postoperative nausea and vomiting)    Vitamin D deficiency 09/09/2020   Wears glasses 09/09/2020   Past Surgical History:  Procedure Laterality Date   BUNIONECTOMY  2011   BUNIONECTOMY  05/22/2011   right   DILATATION & CURETTAGE/HYSTEROSCOPY WITH MYOSURE N/A 06/29/2017   Procedure: DILATATION & CURETTAGE/HYSTEROSCOPY WITH MYOSURE POLYPECTOMY;  Surgeon: Gerald Leitz, MD;  Location: [facility];  Service: Gynecology;  Laterality: N/A;   INTRAUTERINE DEVICE (IUD) INSERTION N/A 06/29/2017   Procedure: INTRAUTERINE DEVICE (IUD) INSERTION;  Surgeon: Gerald Leitz, MD;  Location: [facility];  Service: Gynecology;  Laterality: N/A;  Mirena Placement   LAPAROSCOPIC OVARIAN CYSTECTOMY     age 89   LYMPH GLAND EXCISION     age 2'2   ROBOTIC ASSISTED LAPAROSCOPIC HYSTERECTOMY AND SALPINGECTOMY Bilateral 09/17/2020   Procedure: XI ROBOTIC ASSISTED LAPAROSCOPIC HYSTERECTOMY AND BILATERAL  SALPINGECTOMY;  Surgeon: Gerald Leitz, MD;  Location: [facility];  Service: Gynecology;  Laterality: Bilateral;    Home Medications Prior to Admission medications   Medication Sig Start Date End Date Taking? Authorizing Provider  acetaminophen (TYLENOL) 325 MG tablet Take 2 tablets (650 mg total) by mouth every 6 (six) hours as needed for moderate pain. 10/22/22   Wallis Bamberg, PA-C  cetirizine (ZYRTEC ALLERGY) 10 MG tablet Take 1 tablet (10 mg total) by mouth daily. 10/22/22   Wallis Bamberg, PA-C  ipratropium (ATROVENT) 0.03 % nasal spray Place 2 sprays into both nostrils 2 (two) times daily. 10/22/22   Wallis Bamberg, PA-C  losartan-hydrochlorothiazide (HYZAAR) 100-12.5 MG tablet Take 1 tablet by mouth daily. 11/10/21   [provider]  omeprazole (PRILOSEC) 20 MG capsule Take 1 capsule (20 mg total) by mouth daily. 10/23/22   Benjiman Core, MD  ondansetron (ZOFRAN-ODT) 8 MG disintegrating tablet Take 1 tablet (8 mg total) by mouth every 8 (eight) hours as needed for nausea or vomiting. 10/22/22   Wallis Bamberg, PA-C  prochlorperazine (COMPAZINE) 5 MG tablet Take 1 tablet (5 mg total) by mouth every 8 (eight) hours as needed for nausea or vomiting. 10/23/22   Benjiman Core, MD  propranolol (INDERAL) 20 MG tablet TAKE 1 TABLET BY MOUTH THREE TIMES A DAY 05/04/18 05/19/19  Bing Neighbors, NP  rizatriptan (MAXALT-MLT) 10 MG disintegrating tablet Take 10 mg by mouth as needed for migraine. May repeat in 2  hours if needed  05/19/19  [provider]      Allergies    Feraheme [ferumoxytol]    Review of Systems   Review of Systems  Gastrointestinal:  Positive for abdominal pain.    Physical Exam Updated Vital Signs BP (!) 174/72   Pulse 64   Temp 98.1 F (36.7 C) (Oral)   Resp 18   LMP 09/05/2020 (Approximate)   SpO2 100%  Physical Exam Vitals and nursing note reviewed.  Constitutional:      Appearance: She is obese.  Cardiovascular:     Rate and Rhythm:  Normal rate.  Abdominal:     Comments: Epigastric tenderness without rebound or guarding.  No hernia palpated.  Skin:    Capillary Refill: Capillary refill takes less than 2 seconds.  Neurological:     Mental Status: She is alert.     ED Results / Procedures / Treatments   Labs (all labs ordered are listed, but only abnormal results are displayed) Labs Reviewed  COMPREHENSIVE METABOLIC PANEL - Abnormal; Notable for the following components:      Result Value   AST 14 (*)    All other components within normal limits  CBC - Abnormal; Notable for the following components:   RBC 5.17 (*)    MCV 75.8 (*)    MCH 23.2 (*)    RDW 15.9 (*)    All other components within normal limits  LIPASE, BLOOD  URINALYSIS, ROUTINE W REFLEX MICROSCOPIC  TROPONIN I (HIGH SENSITIVITY)    EKG EKG Interpretation Date/Time:  Saturday October 23 2022 11:40:55 EDT Ventricular Rate:  71 PR Interval:  148 QRS Duration:  88 QT Interval:  406 QTC Calculation: 441 R Axis:   -13  Text Interpretation: Sinus rhythm with Premature supraventricular complexes and with occasional Premature ventricular complexes Anterolateral infarct , age undetermined Abnormal ECG No previous ECGs available Confirmed by Benjiman Core [phone]) on 10/23/2022 12:54:10 PM  Radiology CT ABDOMEN PELVIS W CONTRAST  Result Date: 10/23/2022 CLINICAL DATA:  Epigastric discomfort with nausea vomiting for 2 days. EXAM: CT ABDOMEN AND PELVIS WITH CONTRAST TECHNIQUE: Multidetector CT imaging of the abdomen and pelvis was performed using the standard protocol following bolus administration of intravenous contrast. RADIATION DOSE REDUCTION: This exam was performed according to the departmental dose-optimization program which includes automated exposure control, adjustment of the mA and/or kV according to patient size and/or use of iterative reconstruction technique. CONTRAST:  OMNIPAQUE IOHEXOL 300 MG/ML  SOLN COMPARISON:  None  Available. FINDINGS: Lower chest: Clear lung bases. Hepatobiliary: Liver mildly enlarged with diffuse decreased attenuation consistent with fatty infiltration. No liver mass or focal lesion. Normal gallbladder. No bile duct dilation. Pancreas: Unremarkable. No pancreatic ductal dilatation or surrounding inflammatory changes. Spleen: Normal in size without focal abnormality. Adrenals/Urinary Tract: Normal adrenal glands. Kidneys normal in size, orientation and position with symmetric enhancement. 1 mm nonobstructing stone, lower pole the left kidney. Subcentimeter hypoattenuating lesion from the upper pole the left kidney consistent with a cyst with no follow-up recommended. No other renal masses. No hydronephrosis. Normal ureters. Normal bladder. Stomach/Bowel: Stomach mildly distended. Questionable wall thickening of the gastric antrum, measuring up to 1 cm in thickness. Subtle haziness in the adjacent fat also suggests inflammation. Small bowel and colon are normal in caliber. No wall thickening. No inflammation. Normal appendix. Vascular/Lymphatic: No significant vascular findings are present. No enlarged abdominal or pelvic lymph nodes. Reproductive: Status post hysterectomy. No adnexal masses. Other: No abdominal wall hernia or abnormality.  No abdominopelvic ascites. Musculoskeletal: No fracture or acute finding.  No bone lesion. IMPRESSION: 1. Stomach wall thickening, most evident of the gastric antrum, with subtle adjacent inflammatory haziness. Findings consistent with gastritis. No visualized ulcer. 2. No other evidence of an acute abnormality within the abdomen or pelvis. 3. Mild hepatomegaly and diffuse hepatic steatosis. Electronically Signed   By: Amie Portland M.D.   On: 10/23/2022 12:41    Procedures Procedures    Medications Ordered in ED Medications  prochlorperazine (COMPAZINE) injection 5 mg (5 mg Intravenous Given 10/23/22 1220)  sodium chloride 0.9 % bolus 1,000 mL (1,000 mLs Intravenous  New Bag/Given 10/23/22 1225)  iohexol (OMNIPAQUE) 300 MG/ML solution 100 mL (100 mLs Intravenous Contrast Given 10/23/22 1230)    ED Course/ Medical Decision Making/ A&P                                 Medical Decision Making Amount and/or Complexity of Data Reviewed Labs: ordered. Radiology: ordered.  Risk Prescription drug management.   Patient epigastric and upper abdominal tenderness.  Nausea and vomiting.  No diarrhea but reportedly decreased bowel movements.  Does have intra-abdominal surgeries previously for which she would put her at high risk for obstruction.  Potential biliary disease also considered.  Blood work overall reassuring.  Will get CT scan to evaluate.  CT scan showed gastritis.  No obstruction.  Has tolerated orals but continued pain.  Will start on proton pump inhibitor and have follow-up with PCP and GI.  Will discharge home.        Final Clinical Impression(s) / ED Diagnoses Final diagnoses:  Acute gastritis without hemorrhage, unspecified gastritis type  Nausea and vomiting, unspecified vomiting type    Rx / DC Orders ED Discharge Orders          Ordered    omeprazole (PRILOSEC) 20 MG capsule  Daily,   Status:  Discontinued        10/23/22 1429    prochlorperazine (COMPAZINE) 5 MG tablet  Every 8 hours PRN,   Status:  Discontinued        10/23/22 1429    omeprazole (PRILOSEC) 20 MG capsule  Daily        10/23/22 1442    prochlorperazine (COMPAZINE) 5 MG tablet  Every 8 hours PRN        10/23/22 1442              Benjiman Core, MD 10/23/22 1501

## 2022-10-23 NOTE — ED Notes (Signed)
Transport to ct

## 2023-04-22 ENCOUNTER — Ambulatory Visit
Admission: EM | Admit: 2023-04-22 | Discharge: 2023-04-22 | Disposition: A | Discharge status: Home / Self Care | Attending: Family Medicine | Admitting: Family Medicine

## 2023-04-22 DIAGNOSIS — J329 Chronic sinusitis, unspecified: Secondary | ICD-10-CM | POA: Diagnosis not present

## 2023-04-22 DIAGNOSIS — J40 Bronchitis, not specified as acute or chronic: Secondary | ICD-10-CM

## 2023-04-22 MED ORDER — PROMETHAZINE-DM 6.25-15 MG/5ML PO SYRP
5.0000 mL | ORAL_SOLUTION | Freq: Three times a day (TID) | ORAL | 0 refills | Status: AC | PRN
Start: 2023-04-22 — End: ?

## 2023-04-22 MED ORDER — PREDNISONE 20 MG PO TABS
ORAL_TABLET | ORAL | 0 refills | Status: AC
Start: 2023-04-22 — End: ?

## 2023-04-22 MED ORDER — AMOXICILLIN-POT CLAVULANATE 875-125 MG PO TABS
1.0000 | ORAL_TABLET | Freq: Two times a day (BID) | ORAL | 0 refills | Status: AC
Start: 2023-04-22 — End: ?

## 2023-04-22 NOTE — ED Triage Notes (Signed)
 Pt states cough and congestion for the past week.  States she has been taking mucinex and sudafed at home.

## 2023-04-22 NOTE — ED Provider Notes (Signed)
 [facility] - URGENT [facility] C[facility]  Note:  This document was prepared using Conservation officer, historic buildings and may include unintentional dictation errors.  MRN: [id number] DOB: April 26, 1975  Subjective:   [name] [name] is a 48 y.o. female presenting for 10 day history of sinus congestion, sinus drainage, having a head cold. Has had persistent coughing, congestion in her chest now with chest pain. No asthma.   No current facility-administered medications for this encounter.  Current Outpatient Medications:    acetaminophen (TYLENOL) 325 MG tablet, Take 2 tablets (650 mg total) by mouth every 6 (six) hours as needed for moderate pain., Disp: 30 tablet, Rfl: 0   cetirizine (ZYRTEC ALLERGY) 10 MG tablet, Take 1 tablet (10 mg total) by mouth daily., Disp: 30 tablet, Rfl: 0   ipratropium (ATROVENT) 0.03 % nasal spray, Place 2 sprays into both nostrils 2 (two) times daily., Disp: 30 mL, Rfl: 0   losartan-hydrochlorothiazide (HYZAAR) 100-12.5 MG tablet, Take 1 tablet by mouth daily., Disp: , Rfl:    omeprazole (PRILOSEC) 20 MG capsule, Take 1 capsule (20 mg total) by mouth daily., Disp: 14 capsule, Rfl: 0   ondansetron (ZOFRAN-ODT) 8 MG disintegrating tablet, Take 1 tablet (8 mg total) by mouth every 8 (eight) hours as needed for nausea or vomiting., Disp: 20 tablet, Rfl: 0   prochlorperazine (COMPAZINE) 5 MG tablet, Take 1 tablet (5 mg total) by mouth every 8 (eight) hours as needed for nausea or vomiting., Disp: 8 tablet, Rfl: 0   Allergies  Allergen Reactions   Feraheme [Ferumoxytol] Hives    Past Medical History:  Diagnosis Date   Anemia    Elevated blood pressure reading without diagnosis of hypertension    runs 160 on top number no meds   Heart murmur    slight murmur no cardiologist   Insomnia 09/09/2020   migraine    PCOS (polycystic ovarian syndrome)    PONV (postoperative nausea and vomiting)    Vitamin D deficiency 09/09/2020   Wears glasses 09/09/2020     Past  Surgical History:  Procedure Laterality Date   BUNIONECTOMY  2011   BUNIONECTOMY  05/22/2011   right   DILATATION & CURETTAGE/HYSTEROSCOPY WITH MYOSURE N/A 06/29/2017   Procedure: DILATATION & CURETTAGE/HYSTEROSCOPY WITH MYOSURE POLYPECTOMY;  Surgeon: Gerald Leitz, MD;  Location: [facility];  Service: Gynecology;  Laterality: N/A;   INTRAUTERINE DEVICE (IUD) INSERTION N/A 06/29/2017   Procedure: INTRAUTERINE DEVICE (IUD) INSERTION;  Surgeon: Gerald Leitz, MD;  Location: [facility];  Service: Gynecology;  Laterality: N/A;  Mirena Placement   LAPAROSCOPIC OVARIAN CYSTECTOMY     age 46   LYMPH GLAND EXCISION     age 62'2   ROBOTIC ASSISTED LAPAROSCOPIC HYSTERECTOMY AND SALPINGECTOMY Bilateral 09/17/2020   Procedure: XI ROBOTIC ASSISTED LAPAROSCOPIC HYSTERECTOMY AND BILATERAL SALPINGECTOMY;  Surgeon: Gerald Leitz, MD;  Location: [facility];  Service: Gynecology;  Laterality: Bilateral;    Family History  Problem Relation Age of Onset   Hypertension Mother     Social History   Tobacco Use   Smoking status: Never   Smokeless tobacco: Never  Vaping Use   Vaping status: Never Used  Substance Use Topics   Alcohol use: Not Currently   Drug use: No    ROS   Objective:   Vitals: BP (!) 145/84 (BP Location: Right Arm)   Pulse 71   Temp 98.3 F (36.8 C) (Oral)   Resp 16   LMP 09/05/2020 (Approximate)   SpO2 96%   Physical  Exam Constitutional:      General: She is not in acute distress.    Appearance: Normal appearance. She is well-developed and normal weight. She is not ill-appearing, toxic-appearing or diaphoretic.  HENT:     Head: Normocephalic and atraumatic.     Right Ear: Tympanic membrane, ear canal and external ear normal. No drainage or tenderness. No middle ear effusion. There is no impacted cerumen. Tympanic membrane is not erythematous or bulging.     Left Ear: Tympanic membrane, ear canal and external ear normal. No drainage or tenderness.  No middle ear  effusion. There is no impacted cerumen. Tympanic membrane is not erythematous or bulging.     Nose: Nose normal. No congestion or rhinorrhea.     Mouth/Throat:     Mouth: Mucous membranes are moist. No oral lesions.     Pharynx: No pharyngeal swelling, oropharyngeal exudate, posterior oropharyngeal erythema or uvula swelling.     Tonsils: No tonsillar exudate or tonsillar abscesses.  Eyes:     General: No scleral icterus.       Right eye: No discharge.        Left eye: No discharge.     Extraocular Movements: Extraocular movements intact.     Right eye: Normal extraocular motion.     Left eye: Normal extraocular motion.     Conjunctiva/sclera: Conjunctivae normal.  Cardiovascular:     Rate and Rhythm: Normal rate and regular rhythm.     Heart sounds: Normal heart sounds. No murmur heard.    No friction rub. No gallop.  Pulmonary:     Effort: Pulmonary effort is normal. No respiratory distress.     Breath sounds: No stridor. No wheezing, rhonchi or rales.  Chest:     Chest wall: No tenderness.  Musculoskeletal:     Cervical back: Normal range of motion and neck supple.  Lymphadenopathy:     Cervical: No cervical adenopathy.  Skin:    General: Skin is warm and dry.  Neurological:     General: No focal deficit present.     Mental Status: She is alert and oriented to person, place, and time.  Psychiatric:        Mood and Affect: Mood normal.        Behavior: Behavior normal.     Assessment and Plan :   PDMP not reviewed this encounter.  1. Sinobronchitis    Recommend starting Augmentin, prednisone for sinobronchitis.  Use supportive care otherwise.  Will defer imaging for now.  Counseled patient on potential for adverse effects with medications prescribed/recommended today, ER and return-to-clinic precautions discussed, patient verbalized understanding.    Wallis Bamberg, [place] 04/22/23 [phone]

## 2023-04-22 NOTE — Discharge Instructions (Signed)
 Start Augmentin and prednisone to address sinobronchitis. Use Tylenol, Zyrtec and cough syrup as needed.
